# Patient Record
Sex: Female | Born: 1940 | Race: Black or African American | Hispanic: No | Smoking: Current every day smoker
Health system: Southern US, Community
[De-identification: ages and names within clinical notes are randomized; demographics above are authoritative.]

## PROBLEM LIST (undated history)

## (undated) DIAGNOSIS — M1990 Unspecified osteoarthritis, unspecified site: Secondary | ICD-10-CM

## (undated) DIAGNOSIS — R0601 Orthopnea: Secondary | ICD-10-CM

## (undated) DIAGNOSIS — I739 Peripheral vascular disease, unspecified: Secondary | ICD-10-CM

## (undated) DIAGNOSIS — I219 Acute myocardial infarction, unspecified: Secondary | ICD-10-CM

## (undated) DIAGNOSIS — I10 Essential (primary) hypertension: Secondary | ICD-10-CM

## (undated) DIAGNOSIS — K219 Gastro-esophageal reflux disease without esophagitis: Secondary | ICD-10-CM

## (undated) DIAGNOSIS — I2510 Atherosclerotic heart disease of native coronary artery without angina pectoris: Secondary | ICD-10-CM

## (undated) DIAGNOSIS — T7840XA Allergy, unspecified, initial encounter: Secondary | ICD-10-CM

## (undated) DIAGNOSIS — Z87898 Personal history of other specified conditions: Secondary | ICD-10-CM

## (undated) DIAGNOSIS — J45909 Unspecified asthma, uncomplicated: Secondary | ICD-10-CM

## (undated) DIAGNOSIS — G4730 Sleep apnea, unspecified: Secondary | ICD-10-CM

## (undated) HISTORY — PX: SHOULDER ARTHROSCOPY: SHX128

## (undated) HISTORY — PX: CORONARY ANGIOPLASTY: SHX604

## (undated) HISTORY — PX: ABDOMINAL HYSTERECTOMY: SHX81

## (undated) HISTORY — PX: VASCULAR SURGERY: SHX849

---

## 1990-01-21 HISTORY — PX: BREAST BIOPSY: SHX20

## 2001-12-26 DIAGNOSIS — I10 Essential (primary) hypertension: Secondary | ICD-10-CM | POA: Diagnosis present

## 2003-05-27 DIAGNOSIS — I739 Peripheral vascular disease, unspecified: Secondary | ICD-10-CM | POA: Diagnosis present

## 2003-05-27 DIAGNOSIS — E7800 Pure hypercholesterolemia, unspecified: Secondary | ICD-10-CM | POA: Diagnosis present

## 2003-07-26 DIAGNOSIS — I2510 Atherosclerotic heart disease of native coronary artery without angina pectoris: Secondary | ICD-10-CM | POA: Diagnosis present

## 2006-08-08 ENCOUNTER — Emergency Department: Payer: Self-pay | Admitting: Emergency Medicine

## 2006-08-08 IMAGING — CR DG CHEST 2V
1 series · 2 of 2 positions shown · non-contrast
Comparison: none

REASON FOR EXAM: cough
COMMENTS:

PROCEDURE:     DXR - DXR CHEST PA (OR AP) AND LATERAL  - [DATE]  [DATE]
RESULT:     Comparison: No available comparison exam.

[Series 1: view not recorded · 0.17mm/px · 2 of 2 slices shown]
[im 1/2]
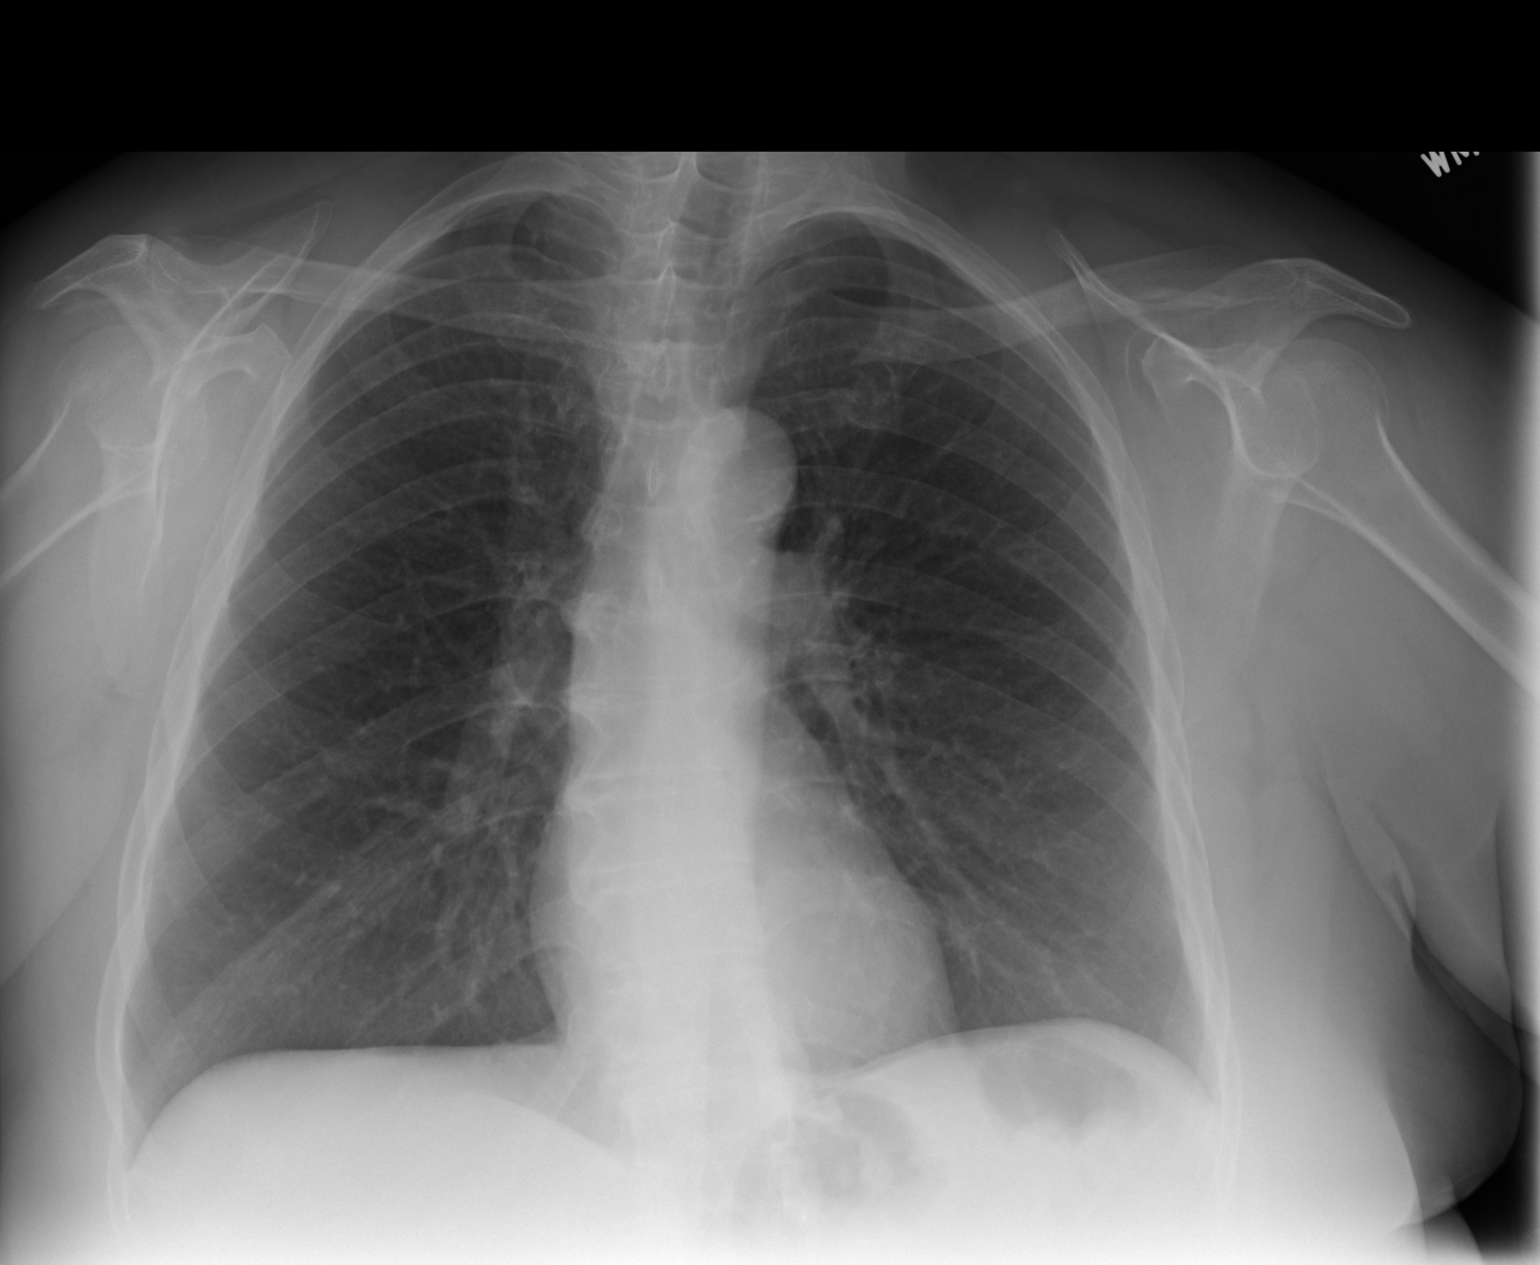
[im 2/2]
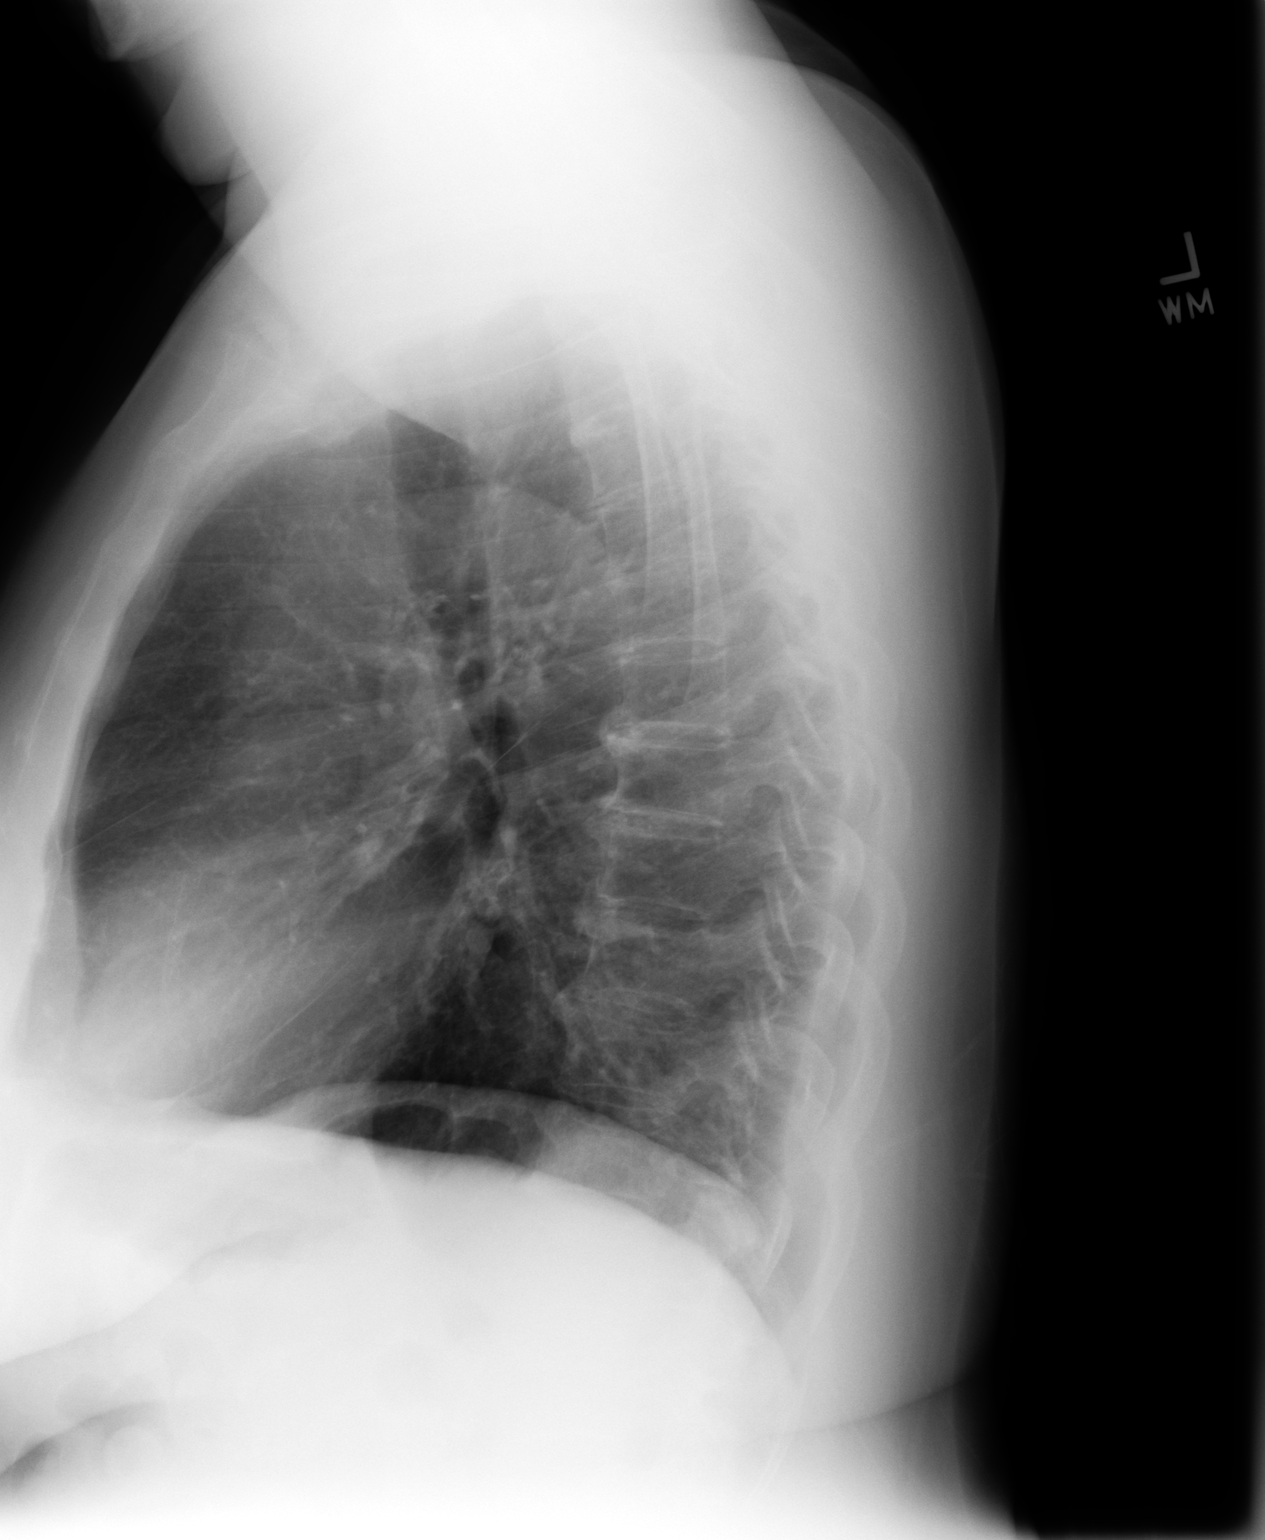

[2 of 2 positions shown; findings below may reference images not displayed]

FINDINGS: There is no significant pulmonary consolidation, pulmonary edema, pleural
effusion, nor pneumothorax. The cardiomediastinal silhouette is within
normal limits.

No displaced rib fracture is noted. There is mild dextro convex curvature of
the thoracic spine.
IMPRESSION: 1. No acute cardiopulmonary abnormality is noted.

## 2007-07-27 ENCOUNTER — Emergency Department: Payer: Self-pay | Admitting: Emergency Medicine

## 2007-10-08 ENCOUNTER — Emergency Department: Payer: Self-pay | Admitting: Internal Medicine

## 2009-05-12 ENCOUNTER — Emergency Department: Payer: Self-pay | Admitting: Emergency Medicine

## 2011-03-14 ENCOUNTER — Emergency Department: Payer: Self-pay | Admitting: Internal Medicine

## 2012-05-21 ENCOUNTER — Emergency Department: Payer: Self-pay | Admitting: Emergency Medicine

## 2012-05-21 LAB — BASIC METABOLIC PANEL
Anion Gap: 10 (ref 7–16)
BUN: 25 mg/dL — ABNORMAL HIGH (ref 7–18)
Calcium, Total: 9.3 mg/dL (ref 8.5–10.1)
Co2: 25 mmol/L (ref 21–32)
Creatinine: 1.25 mg/dL (ref 0.60–1.30)
EGFR (Non-African Amer.): 43 — ABNORMAL LOW
Glucose: 104 mg/dL — ABNORMAL HIGH (ref 65–99)
Osmolality: 282 (ref 275–301)
Potassium: 2.9 mmol/L — ABNORMAL LOW (ref 3.5–5.1)
Sodium: 139 mmol/L (ref 136–145)

## 2012-05-21 LAB — CBC
HCT: 37.3 % (ref 35.0–47.0)
HGB: 12.3 g/dL (ref 12.0–16.0)
MCHC: 33.1 g/dL (ref 32.0–36.0)
MCV: 81 fL (ref 80–100)
RBC: 4.63 10*6/uL (ref 3.80–5.20)
RDW: 14.7 % — ABNORMAL HIGH (ref 11.5–14.5)

## 2012-05-21 LAB — URIC ACID: Uric Acid: 6.6 mg/dL — ABNORMAL HIGH (ref 2.6–6.0)

## 2012-05-21 IMAGING — CR RIGHT HAND - COMPLETE 3+ VIEW
1 series · 3 of 3 positions shown · non-contrast
Comparison: none

REASON FOR EXAM: atraumatic pain
COMMENTS:

[Series 1: pa · 0.17mm/px · 3 of 3 slices shown]
[im 1/3]
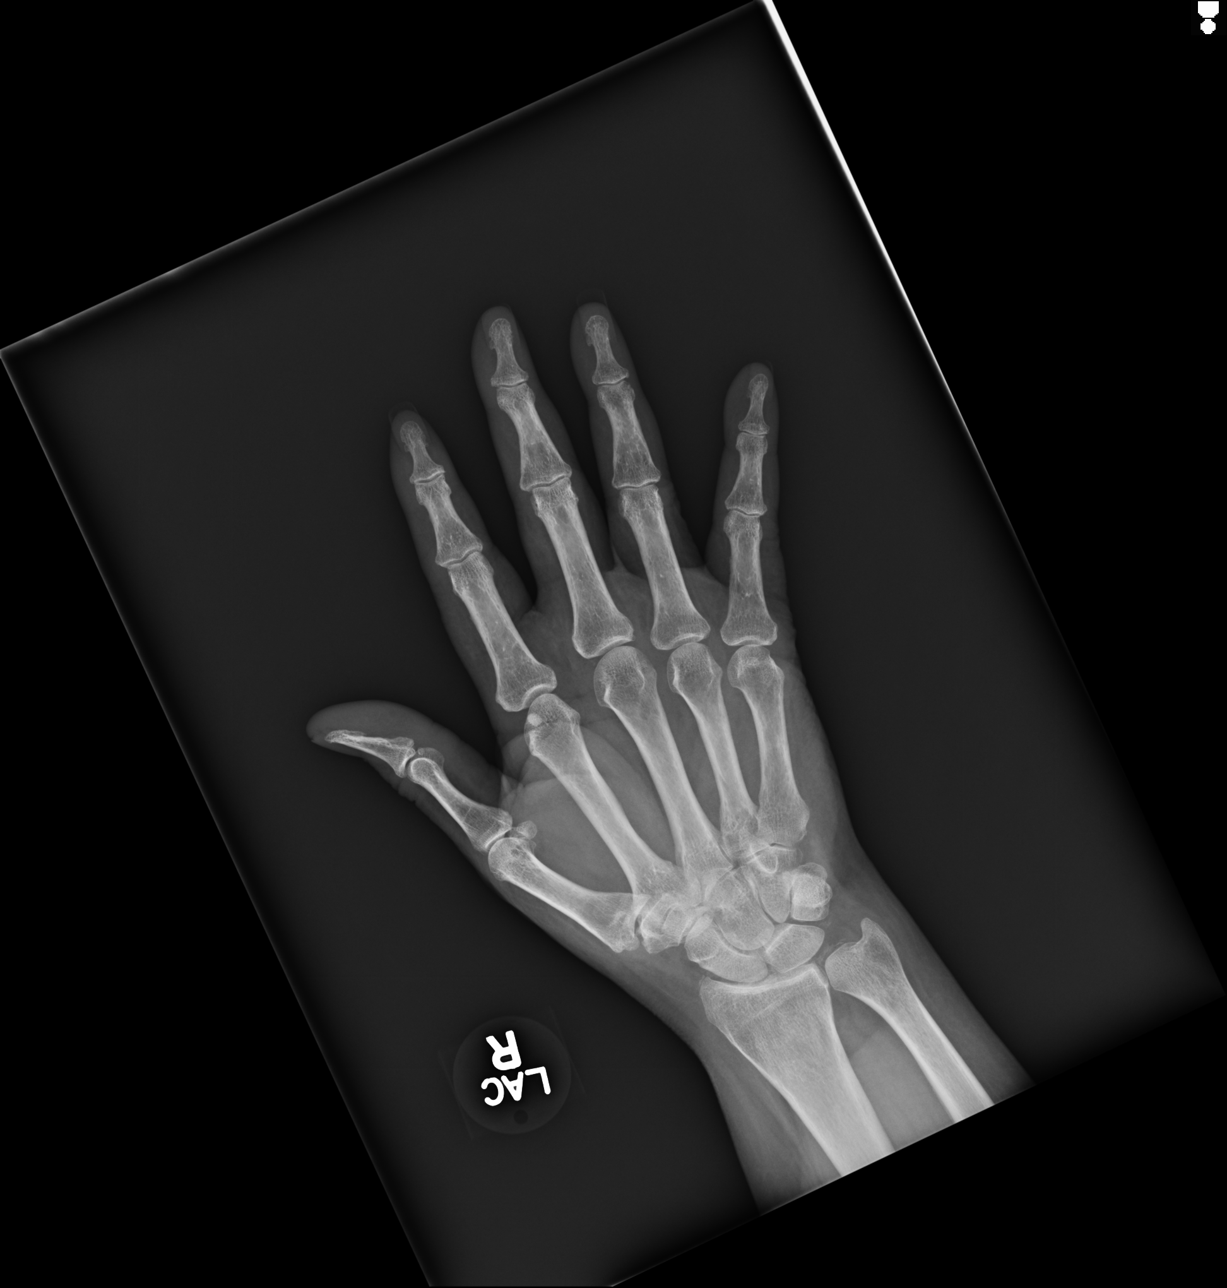
[im 2/3]
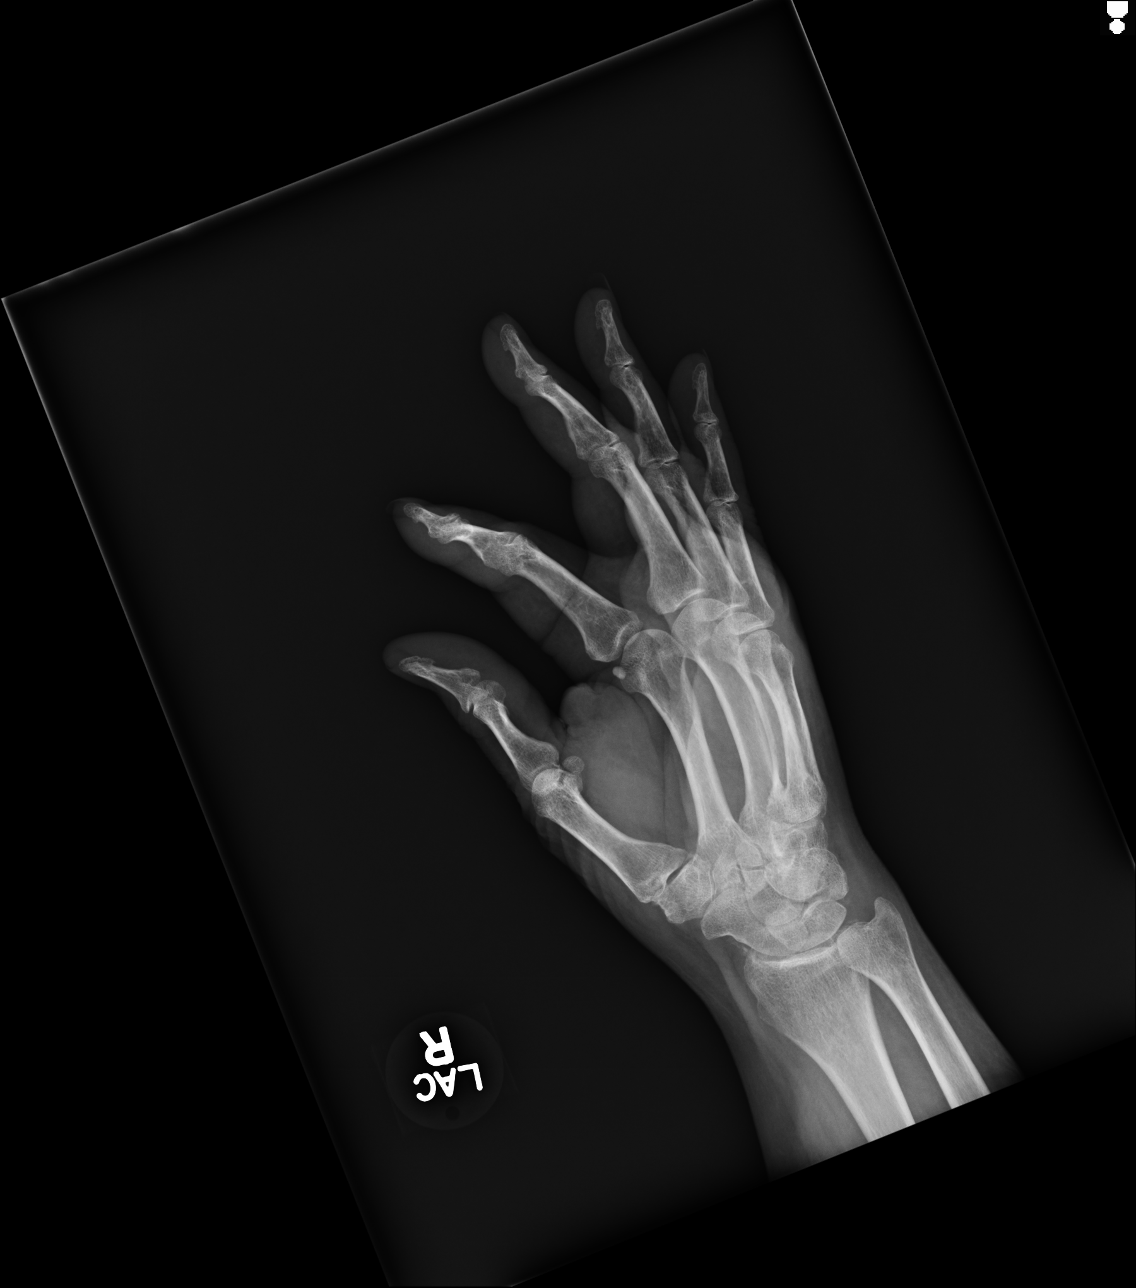
[im 3/3]
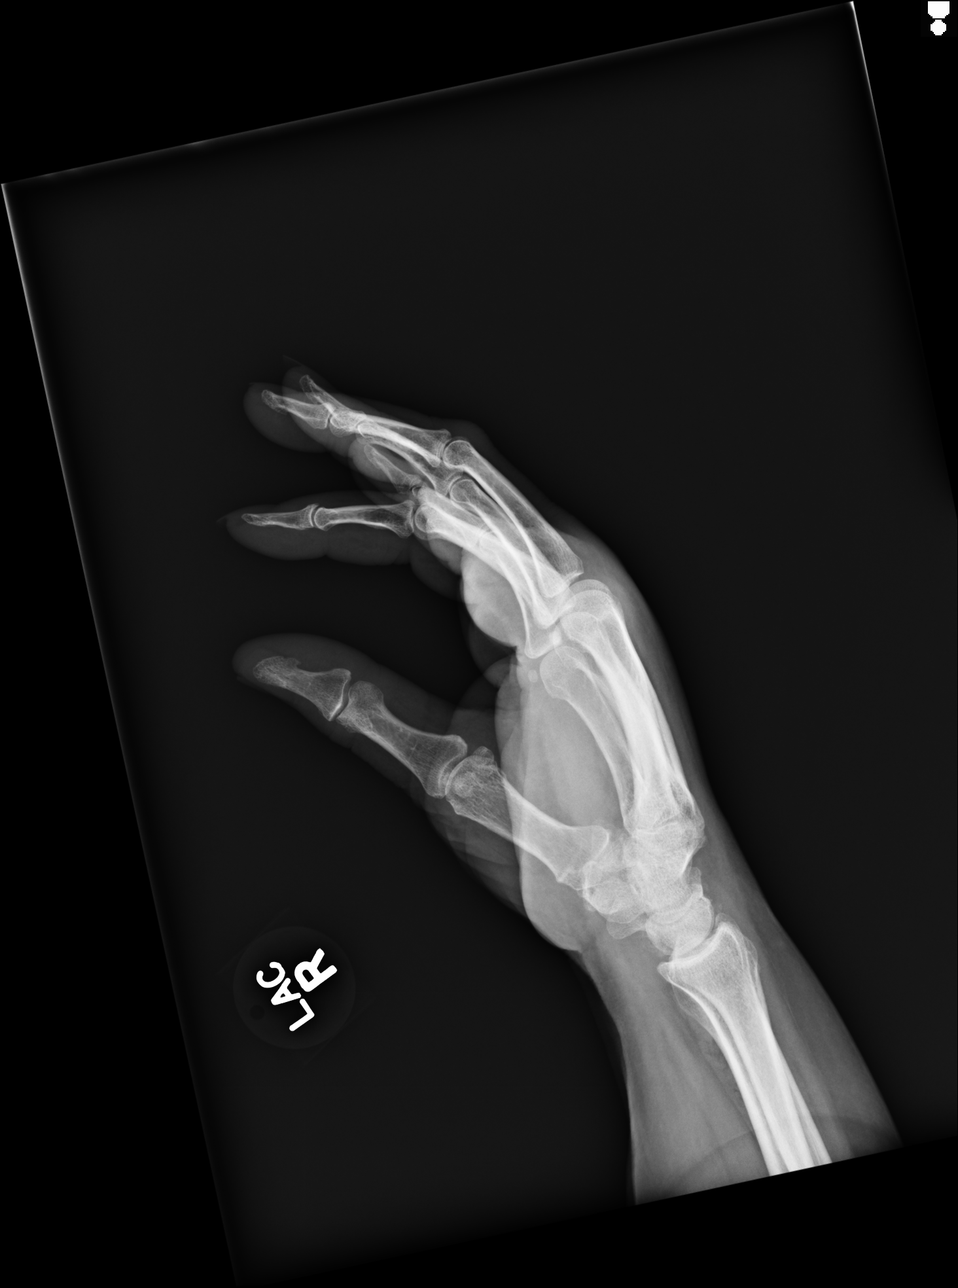

[3 of 3 positions shown; findings below may reference images not displayed]

PROCEDURE:     DXR - DXR HAND RT COMPLETE W/OBLIQUES  - [DATE]  [DATE]

RESULT:     Three views of the right hand are submitted. The site of the
patient's symptoms is not noted in the clinical history. The bones are
adequately mineralized for age. There is narrowing of the interphalangeal
joints consistent with osteoarthritis. The metacarpophalangeal joints appear
normal for age. There is mild narrowing of the carpometacarpal joints. Mild
soft tissue swelling is noted in the metacarpophalangeal joint region
diffusely.
IMPRESSION: There is no acute bony abnormality of the right hand. Mild
degenerative changes are present as described.

[REDACTED]

## 2014-09-09 ENCOUNTER — Encounter: Payer: Self-pay | Admitting: Emergency Medicine

## 2014-09-09 ENCOUNTER — Observation Stay
Admission: EM | Admit: 2014-09-09 | Discharge: 2014-09-10 | Disposition: A | Discharge status: Home / Self Care | Payer: Commercial Managed Care - HMO | Attending: Internal Medicine | Admitting: Internal Medicine

## 2014-09-09 ENCOUNTER — Observation Stay: Payer: Commercial Managed Care - HMO

## 2014-09-09 DIAGNOSIS — Z7982 Long term (current) use of aspirin: Secondary | ICD-10-CM | POA: Diagnosis not present

## 2014-09-09 DIAGNOSIS — J45909 Unspecified asthma, uncomplicated: Secondary | ICD-10-CM | POA: Diagnosis not present

## 2014-09-09 DIAGNOSIS — I10 Essential (primary) hypertension: Secondary | ICD-10-CM | POA: Diagnosis not present

## 2014-09-09 DIAGNOSIS — E860 Dehydration: Secondary | ICD-10-CM

## 2014-09-09 DIAGNOSIS — Z91041 Radiographic dye allergy status: Secondary | ICD-10-CM | POA: Diagnosis not present

## 2014-09-09 DIAGNOSIS — R112 Nausea with vomiting, unspecified: Secondary | ICD-10-CM | POA: Diagnosis not present

## 2014-09-09 DIAGNOSIS — Z885 Allergy status to narcotic agent status: Secondary | ICD-10-CM | POA: Insufficient documentation

## 2014-09-09 DIAGNOSIS — I2510 Atherosclerotic heart disease of native coronary artery without angina pectoris: Secondary | ICD-10-CM | POA: Insufficient documentation

## 2014-09-09 DIAGNOSIS — Z955 Presence of coronary angioplasty implant and graft: Secondary | ICD-10-CM | POA: Diagnosis not present

## 2014-09-09 DIAGNOSIS — F17210 Nicotine dependence, cigarettes, uncomplicated: Secondary | ICD-10-CM | POA: Insufficient documentation

## 2014-09-09 DIAGNOSIS — M6281 Muscle weakness (generalized): Secondary | ICD-10-CM | POA: Diagnosis not present

## 2014-09-09 DIAGNOSIS — R29898 Other symptoms and signs involving the musculoskeletal system: Secondary | ICD-10-CM | POA: Diagnosis present

## 2014-09-09 DIAGNOSIS — I252 Old myocardial infarction: Secondary | ICD-10-CM | POA: Insufficient documentation

## 2014-09-09 DIAGNOSIS — Z8249 Family history of ischemic heart disease and other diseases of the circulatory system: Secondary | ICD-10-CM | POA: Insufficient documentation

## 2014-09-09 DIAGNOSIS — J449 Chronic obstructive pulmonary disease, unspecified: Secondary | ICD-10-CM | POA: Diagnosis not present

## 2014-09-09 DIAGNOSIS — E876 Hypokalemia: Secondary | ICD-10-CM | POA: Diagnosis not present

## 2014-09-09 DIAGNOSIS — R944 Abnormal results of kidney function studies: Secondary | ICD-10-CM | POA: Insufficient documentation

## 2014-09-09 DIAGNOSIS — Z8042 Family history of malignant neoplasm of prostate: Secondary | ICD-10-CM | POA: Insufficient documentation

## 2014-09-09 DIAGNOSIS — R197 Diarrhea, unspecified: Secondary | ICD-10-CM | POA: Diagnosis not present

## 2014-09-09 DIAGNOSIS — Z90710 Acquired absence of both cervix and uterus: Secondary | ICD-10-CM | POA: Insufficient documentation

## 2014-09-09 DIAGNOSIS — N179 Acute kidney failure, unspecified: Secondary | ICD-10-CM

## 2014-09-09 DIAGNOSIS — D72829 Elevated white blood cell count, unspecified: Secondary | ICD-10-CM | POA: Insufficient documentation

## 2014-09-09 HISTORY — DX: Unspecified asthma, uncomplicated: J45.909

## 2014-09-09 HISTORY — DX: Allergy, unspecified, initial encounter: T78.40XA

## 2014-09-09 HISTORY — DX: Atherosclerotic heart disease of native coronary artery without angina pectoris: I25.10

## 2014-09-09 HISTORY — DX: Essential (primary) hypertension: I10

## 2014-09-09 HISTORY — DX: Acute myocardial infarction, unspecified: I21.9

## 2014-09-09 LAB — URINALYSIS COMPLETE WITH MICROSCOPIC (ARMC ONLY)
BILIRUBIN URINE: NEGATIVE
GLUCOSE, UA: NEGATIVE mg/dL
Hgb urine dipstick: NEGATIVE
KETONES UR: NEGATIVE mg/dL
LEUKOCYTES UA: NEGATIVE
NITRITE: NEGATIVE
Protein, ur: NEGATIVE mg/dL
SPECIFIC GRAVITY, URINE: 1.014 (ref 1.005–1.030)
pH: 5 (ref 5.0–8.0)

## 2014-09-09 LAB — COMPREHENSIVE METABOLIC PANEL
ALT: 10 U/L — ABNORMAL LOW (ref 14–54)
AST: 27 U/L (ref 15–41)
Albumin: 2.7 g/dL — ABNORMAL LOW (ref 3.5–5.0)
Alkaline Phosphatase: 49 U/L (ref 38–126)
Anion gap: 12 (ref 5–15)
BILIRUBIN TOTAL: 0.4 mg/dL (ref 0.3–1.2)
BUN: 63 mg/dL — AB (ref 6–20)
CO2: 24 mmol/L (ref 22–32)
Calcium: 8 mg/dL — ABNORMAL LOW (ref 8.9–10.3)
Chloride: 97 mmol/L — ABNORMAL LOW (ref 101–111)
Creatinine, Ser: 1.92 mg/dL — ABNORMAL HIGH (ref 0.44–1.00)
GFR calc Af Amer: 28 mL/min — ABNORMAL LOW (ref >60)
GFR, EST NON AFRICAN AMERICAN: 24 mL/min — AB (ref >60)
Glucose, Bld: 135 mg/dL — ABNORMAL HIGH (ref 65–99)
POTASSIUM: 3.2 mmol/L — AB (ref 3.5–5.1)
Sodium: 133 mmol/L — ABNORMAL LOW (ref 135–145)
TOTAL PROTEIN: 5.6 g/dL — AB (ref 6.5–8.1)

## 2014-09-09 LAB — CBC
HEMATOCRIT: 39.8 % (ref 35.0–47.0)
Hemoglobin: 12.6 g/dL (ref 12.0–16.0)
MCH: 25 pg — ABNORMAL LOW (ref 26.0–34.0)
MCHC: 31.7 g/dL — ABNORMAL LOW (ref 32.0–36.0)
MCV: 78.8 fL — ABNORMAL LOW (ref 80.0–100.0)
PLATELETS: 297 10*3/uL (ref 150–440)
RBC: 5.05 MIL/uL (ref 3.80–5.20)
RDW: 15.6 % — AB (ref 11.5–14.5)
WBC: 17 10*3/uL — AB (ref 3.6–11.0)

## 2014-09-09 LAB — MAGNESIUM: Magnesium: 1.9 mg/dL (ref 1.7–2.4)

## 2014-09-09 LAB — LIPASE, BLOOD: Lipase: 16 U/L — ABNORMAL LOW (ref 22–51)

## 2014-09-09 LAB — TROPONIN I: Troponin I: 0.05 ng/mL — ABNORMAL HIGH (ref <0.031)

## 2014-09-09 LAB — TSH: TSH: 2.329 u[IU]/mL (ref 0.350–4.500)

## 2014-09-09 IMAGING — CR DG CHEST 2V
1 series · 2 of 2 positions shown · non-contrast
Comparison: [DATE]

CLINICAL DATA: Leukocytosis.  Asthma.  Coronary artery disease.

EXAM:
CHEST  2 VIEW

[Series 1: dg chest 2 view · 0.14mm/px · 2 of 2 slices shown]
[im 1/2]
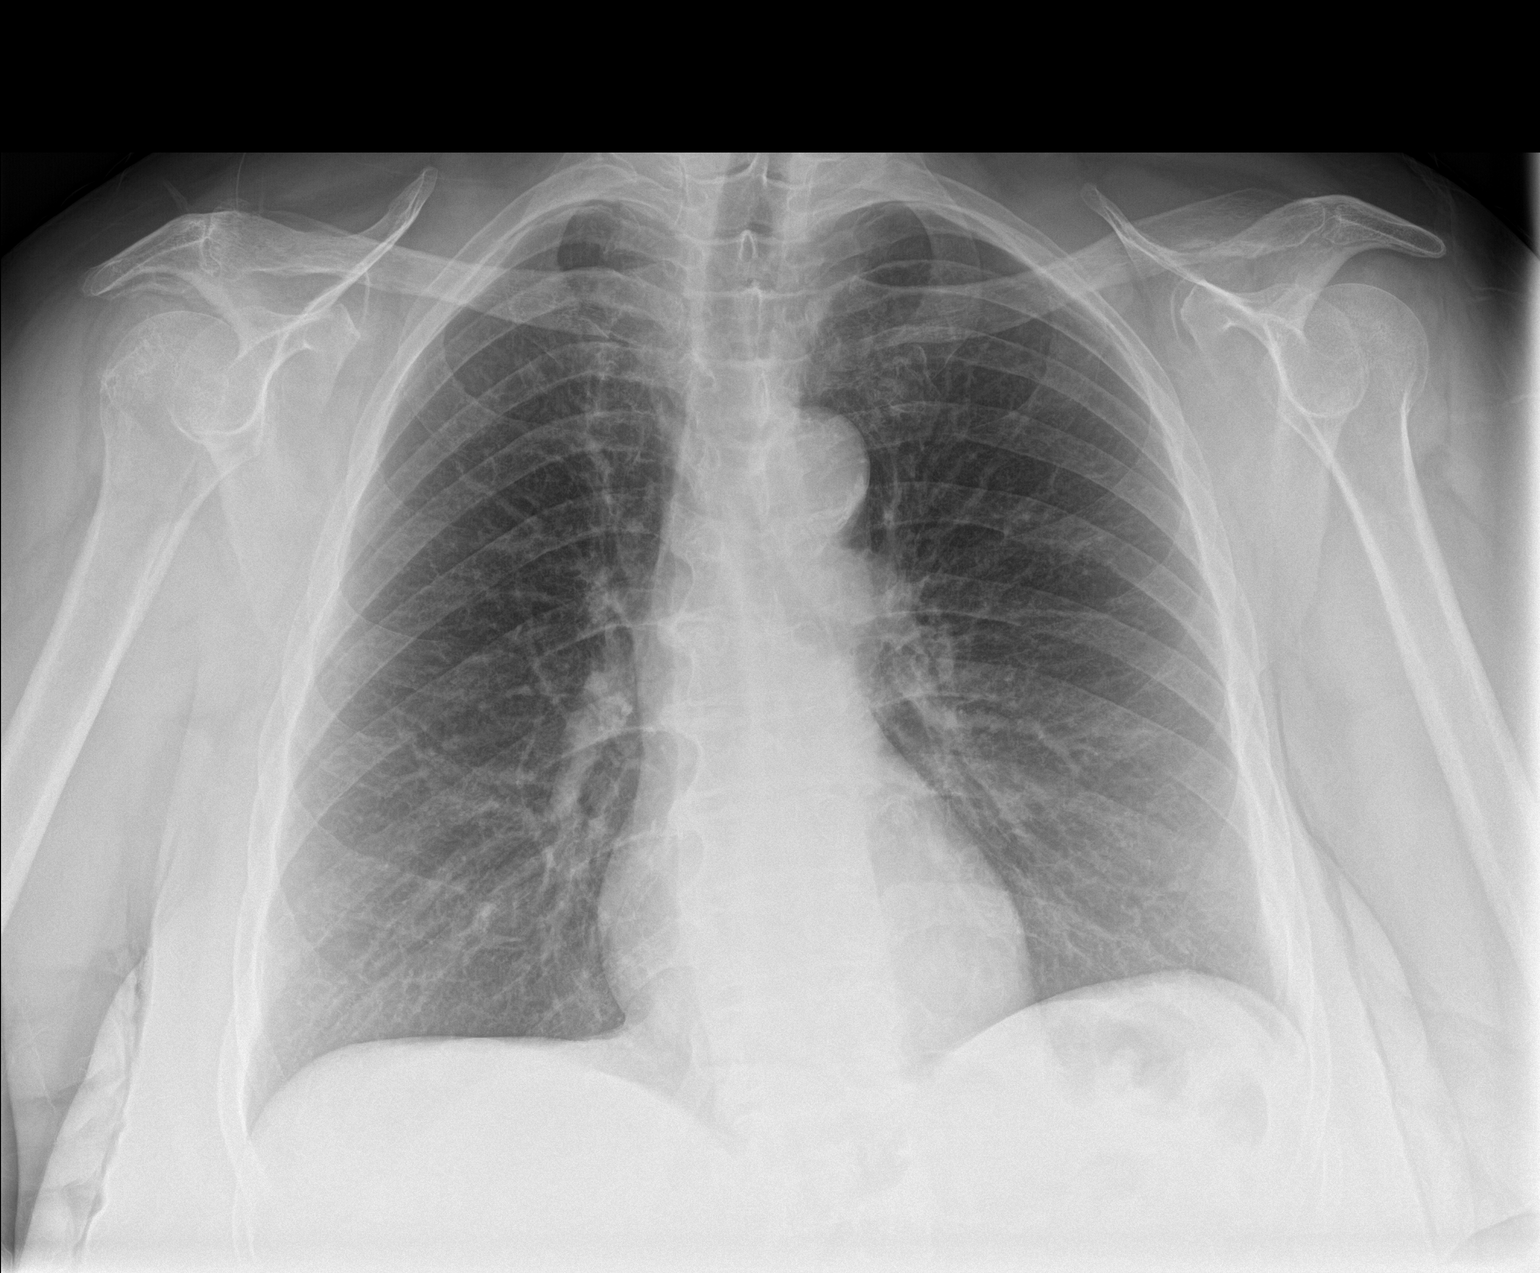
[im 2/2]
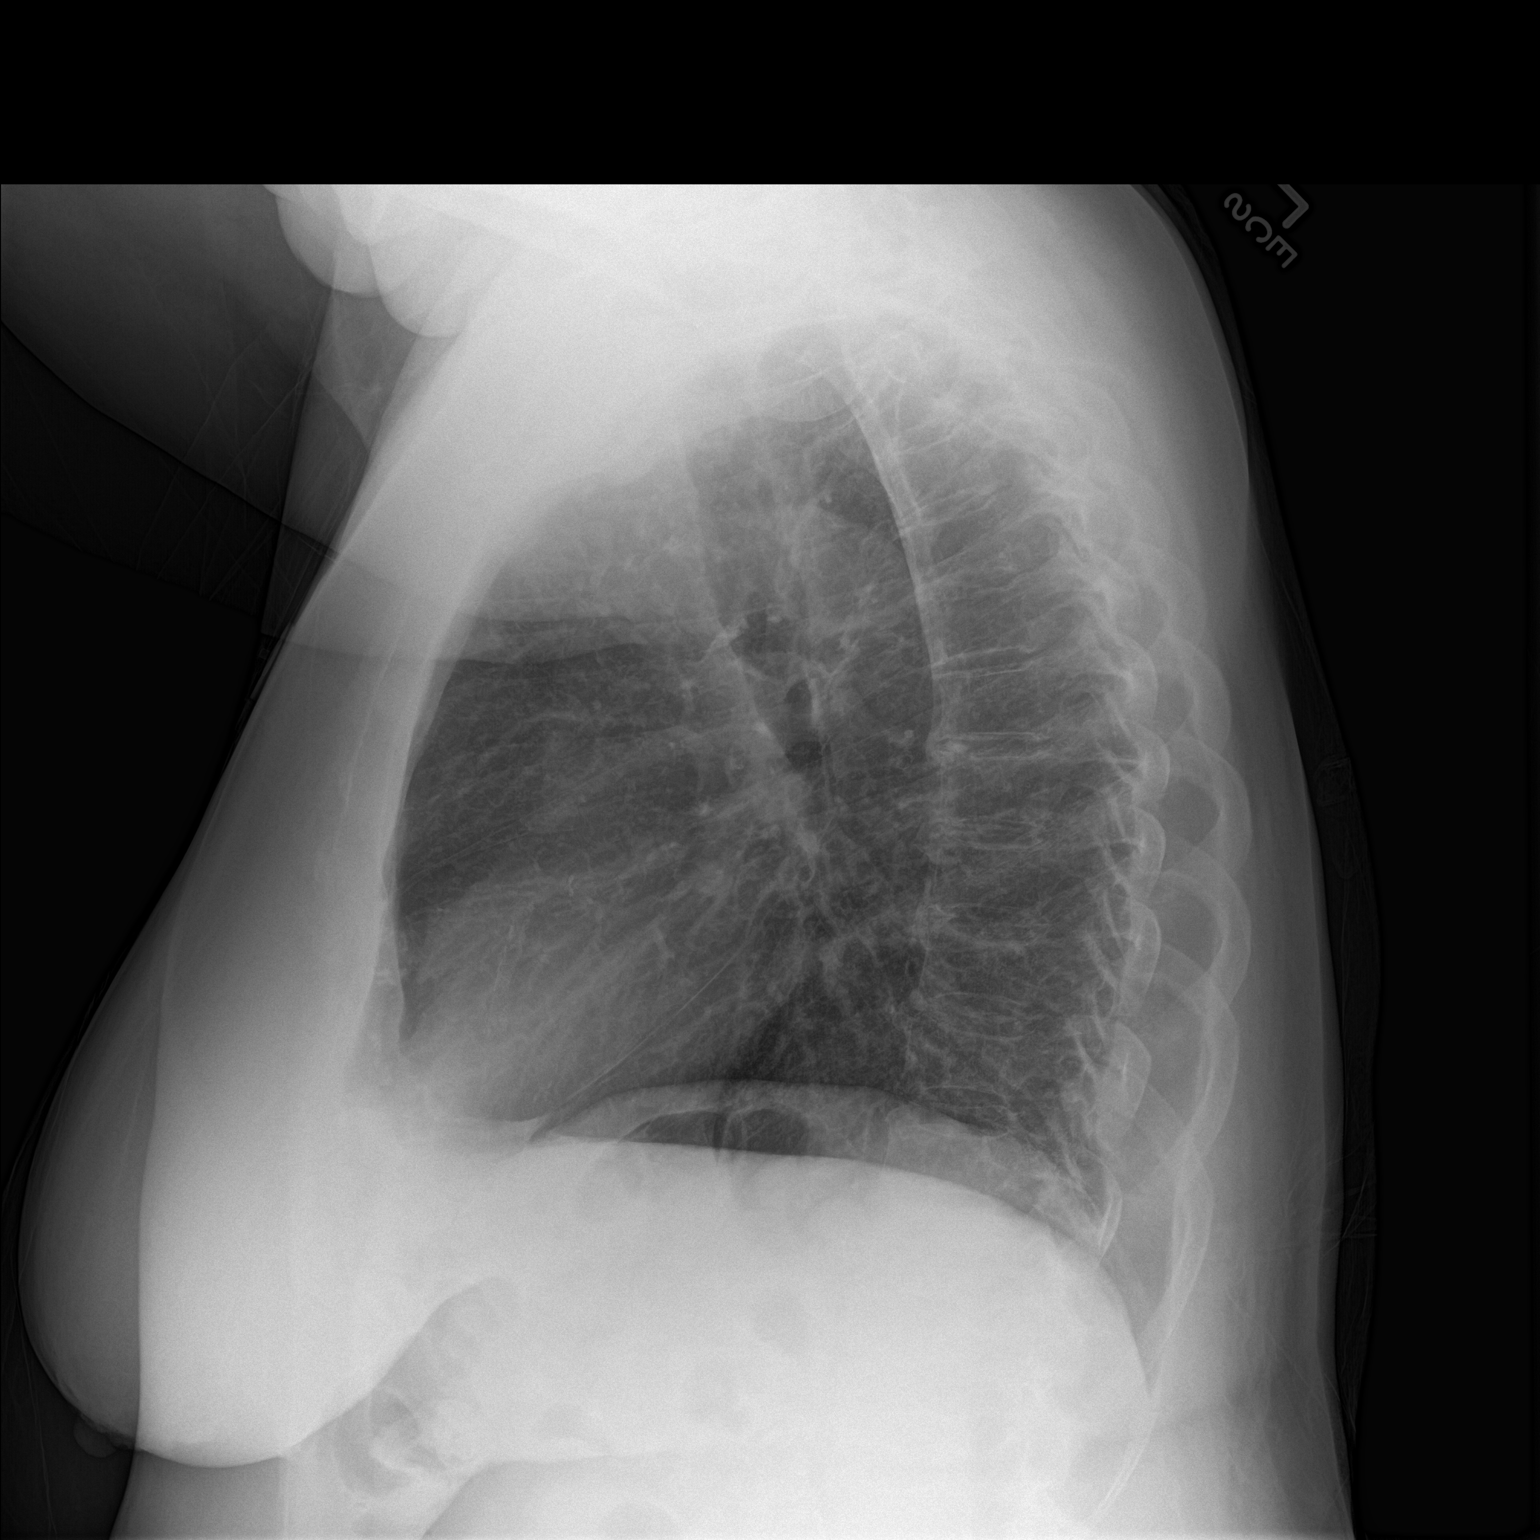

[2 of 2 positions shown; findings below may reference images not displayed]

FINDINGS: The heart size and mediastinal contours are within normal limits.
Both lungs are clear. The visualized skeletal structures are
unremarkable.
IMPRESSION: No active cardiopulmonary disease.

## 2014-09-09 MED ORDER — ASPIRIN 81 MG PO CHEW
162.0000 mg | CHEWABLE_TABLET | Freq: Once | ORAL | Status: AC
  Administered 2014-09-09: 162 mg via ORAL
  Filled 2014-09-09: qty 2

## 2014-09-09 MED ORDER — ATORVASTATIN CALCIUM 20 MG PO TABS
80.0000 mg | ORAL_TABLET | Freq: Every day | ORAL | Status: DC
  Administered 2014-09-09 – 2014-09-10 (×2): 80 mg via ORAL
  Filled 2014-09-09 (×2): qty 4

## 2014-09-09 MED ORDER — PANTOPRAZOLE SODIUM 40 MG PO TBEC
40.0000 mg | DELAYED_RELEASE_TABLET | Freq: Every day | ORAL | Status: DC
  Administered 2014-09-10: 08:00:00 40 mg via ORAL
  Filled 2014-09-09 (×2): qty 1

## 2014-09-09 MED ORDER — HEPARIN SODIUM (PORCINE) 5000 UNIT/ML IJ SOLN
5000.0000 [IU] | Freq: Three times a day (TID) | INTRAMUSCULAR | Status: DC
  Filled 2014-09-09: qty 1

## 2014-09-09 MED ORDER — ASPIRIN EC 81 MG PO TBEC
81.0000 mg | DELAYED_RELEASE_TABLET | Freq: Every day | ORAL | Status: DC
Start: 2014-09-09 — End: 2014-09-10
  Administered 2014-09-10: 08:00:00 81 mg via ORAL
  Filled 2014-09-09 (×2): qty 1

## 2014-09-09 MED ORDER — ALBUTEROL SULFATE (2.5 MG/3ML) 0.083% IN NEBU: 2.5000 mg | INHALATION_SOLUTION | RESPIRATORY_TRACT | Status: DC | PRN

## 2014-09-09 MED ORDER — SODIUM CHLORIDE 0.9 % IV SOLN
INTRAVENOUS | Status: AC
  Administered 2014-09-09 – 2014-09-10 (×2): via INTRAVENOUS

## 2014-09-09 MED ORDER — SODIUM CHLORIDE 0.9 % IV BOLUS (SEPSIS)
1000.0000 mL | Freq: Once | INTRAVENOUS | Status: AC
  Administered 2014-09-09: 1000 mL via INTRAVENOUS

## 2014-09-09 MED ORDER — POTASSIUM CHLORIDE CRYS ER 20 MEQ PO TBCR
40.0000 meq | EXTENDED_RELEASE_TABLET | Freq: Two times a day (BID) | ORAL | Status: DC
  Administered 2014-09-09 – 2014-09-10 (×3): 40 meq via ORAL
  Filled 2014-09-09 (×3): qty 2

## 2014-09-09 MED ORDER — ALBUTEROL SULFATE HFA 108 (90 BASE) MCG/ACT IN AERS: 2.0000 | INHALATION_SPRAY | RESPIRATORY_TRACT | Status: DC | PRN

## 2014-09-09 NOTE — Plan of Care (Signed)
Problem: Discharge Progression Outcomes Goal: Other Discharge Outcomes/Goals Outcome: Progressing Plan of Care Progress to Goal:  Pain:         Denies Pain. Hemodynamically Stability:         VSS.         No N/V/D noted.         Initiated IVF's.         Remains on oral K+.          Labs Improving. Complications:         No s/s of complications noted. Tolerating Diet:         Low Sodium Diet. Activity:         OOB with 1-person Assist.

## 2014-09-09 NOTE — ED Notes (Signed)
Lab notified to add troponin to blood work-says it was already processed.

## 2014-09-09 NOTE — ED Notes (Signed)
Patient transported to X-ray 

## 2014-09-09 NOTE — ED Notes (Signed)
Dr. Jacqualine Code notified of patient's elevated troponin.

## 2014-09-09 NOTE — ED Notes (Signed)
Pt presents with n/v/d for the past four days. Denies any pain.

## 2014-09-09 NOTE — ED Provider Notes (Signed)
[facility] Emergency Department Provider Note  ____________________________________________  Time seen: Approximately 10:19 AM  I have reviewed the triage vital signs and the nursing notes.   HISTORY  Chief Complaint Emesis; Diarrhea; and Nausea    HPI Ariana Lee is a 74 y.o. female history of high blood pressure, asthma. She states that 4 days ago she had an episode where she had a "stomach bug" which lasted for 2 days where she had thrown up several times, and had several loose and watery stools. These symptoms have all resolved, but since that time she's been feeling tired and lightheaded. She reports that when she is walking she just feels like her legs are very tired, and she feels weak and generally very "dry".  She reports that she does not eat breakfast on a daily basis, did not eat breakfast this morning. She did eat well last night, no further nausea vomiting or diarrhea for the last 2 days. No chest pain. No weakness in one arm or leg. No facial droop. No trouble speaking. No numbness or tingling in the arms or legs.  Denies history of frequent urinary tract infections.   Past Medical History  Diagnosis Date  . Hypertension   . Asthma     There are no active problems to display for this patient.   Past Surgical History  Procedure Laterality Date  . Abdominal hysterectomy      Current Outpatient Rx  Name  Route  Sig  Dispense  Refill  . albuterol (PROVENTIL HFA;VENTOLIN HFA) 108 (90 BASE) MCG/ACT inhaler   Inhalation   Inhale 2 puffs into the lungs every 4 (four) hours as needed for wheezing or shortness of breath.         Marland Kitchen aspirin (ASPIRIN EC) 81 MG EC tablet   Oral   Take 81 mg by mouth daily. Swallow whole.         Marland Kitchen atenolol (TENORMIN) 50 MG tablet   Oral   Take 1 tablet by mouth daily.      0   . atorvastatin (LIPITOR) 80 MG tablet   Oral   Take 1 tablet by mouth daily.      0   . ciclopirox (PENLAC) 8 %  solution   Topical   Apply 1 application topically at bedtime.       0   . felodipine (PLENDIL) 10 MG 24 hr tablet   Oral   Take 1 tablet by mouth daily.      0   . hydrochlorothiazide (HYDRODIURIL) 25 MG tablet   Oral   Take 1 tablet by mouth daily.      0   . nitroGLYCERIN (NITROSTAT) 0.3 MG SL tablet   Sublingual   Place 0.3 mg under the tongue every 5 (five) minutes as needed for chest pain.         Marland Kitchen omeprazole (PRILOSEC) 20 MG capsule   Oral   Take 1 capsule by mouth 2 (two) times daily.      0     Allergies Ivp dye and Percocet  No family history on file.  Social History Social History  Substance Use Topics  . Smoking status: Never Smoker   . Smokeless tobacco: None  . Alcohol Use: No    Review of Systems Constitutional: No fever/chills Eyes: No visual changes. ENT: No sore throat. Cardiovascular: Denies chest pain. Respiratory: Denies shortness of breath. Gastrointestinal: In the past 48 hours No abdominal pain.  No nausea, no vomiting.  No diarrhea.  No constipation. Genitourinary: Negative for dysuria. Musculoskeletal: Negative for back pain. Skin: Negative for rash. Neurological: Negative for headaches, focal weakness or numbness.  10-point ROS otherwise negative.  ____________________________________________   PHYSICAL EXAM:  VITAL SIGNS: ED Triage Vitals  Enc Vitals Group     BP 09/09/14 0920 114/51 mmHg     Pulse Rate 09/09/14 0920 77     Resp 09/09/14 0920 20     Temp 09/09/14 0920 98 F (36.7 C)     Temp Source 09/09/14 0920 Oral     SpO2 09/09/14 0920 97 %     Weight 09/09/14 0920 198 lb (89.812 kg)     Height 09/09/14 0920 '5\' 5"'$  (1.651 m)     Head Cir --      Peak Flow --      Pain Score --      Pain Loc --      Pain Edu? --      Excl. in [place]? --     Constitutional: Alert and oriented. Well appearing and in no acute distress. Eyes: Conjunctivae are normal. PERRL. EOMI. Head: Atraumatic. Nose: No  congestion/rhinnorhea. Mouth/Throat: Mucous membranes are slightly dry.  Oropharynx non-erythematous. No JVD Neck: No stridor.   Cardiovascular: Normal rate, regular rhythm. Grossly normal heart sounds.  Good peripheral circulation. Respiratory: Normal respiratory effort.  No retractions. Lungs CTAB. Gastrointestinal: Soft and nontender. No distention. No abdominal bruits. No CVA tenderness. Musculoskeletal: No lower extremity tenderness nor edema.  No joint effusions. Neurologic:  Normal speech and language. No gross focal neurologic deficits are appreciated. No gait instability. Normal cranial nerves. Normal speech. Moves all extremities 5 out of 5 with normal sensation in all extremities and the face. No evidence of neurologic deficit. No pronator drift. Skin:  Skin is warm, dry and intact. No rash noted. Psychiatric: Mood and affect are normal. Speech and behavior are normal.  ____________________________________________   LABS (all labs ordered are listed, but only abnormal results are displayed)  Labs Reviewed  LIPASE, BLOOD - Abnormal; Notable for the following:    Lipase 16 (*)    All other components within normal limits  COMPREHENSIVE METABOLIC PANEL - Abnormal; Notable for the following:    Sodium 133 (*)    Potassium 3.2 (*)    Chloride 97 (*)    Glucose, Bld 135 (*)    BUN 63 (*)    Creatinine, Ser 1.92 (*)    Calcium 8.0 (*)    Total Protein 5.6 (*)    Albumin 2.7 (*)    ALT 10 (*)    GFR calc non Af Amer 24 (*)    GFR calc Af Amer 28 (*)    All other components within normal limits  CBC - Abnormal; Notable for the following:    WBC 17.0 (*)    MCV 78.8 (*)    MCH 25.0 (*)    MCHC 31.7 (*)    RDW 15.6 (*)    All other components within normal limits  URINALYSIS COMPLETEWITH MICROSCOPIC ([facility] ONLY)  TROPONIN I   ____________________________________________  EKG  Interpreted and reviewed by me Normal sinus rhythm, nonspecific T-wave abnormality in 3 and  aVF The treatment rate 75 PR 200 QRS 96 QTc 446 Reviewed and interpreted as normal sinus rhythm with slight T-wave inversion in 3 and aVF, no acute ST elevation ____________________________________________  RADIOLOGY   ____________________________________________   PROCEDURES  Procedure(s) performed: None  Critical Care performed: No  ____________________________________________  INITIAL IMPRESSION / ASSESSMENT AND PLAN / ED COURSE  Pertinent labs & imaging results that were available during my care of the patient were reviewed by me and considered in my medical decision making (see chart for details).  Patient presents with what appears to be generalized weakness without focal abnormality after an episode of nausea and vomiting which has resolved 2 days ago. I suspect that she is likely dehydrated, but we will evaluate with basic labs and obtain a urinalysis. Her exam is very reassuring. She denies any pain. No cardipulmonary symptoms. No acute neurologic symptoms.  ----------------------------------------- 11:29 AM on 09/09/2014 -----------------------------------------  Patient not technically orthostatic, though her systolic blood pressure to drop to 903 systolic with standing. I discussed with the patient her lab results, she would like to state overnight for hydration and understands this would be under observation status.  BUN markedly elevated at 63, creatinine 1.92 which are new and acute for patient.  We will continue to work her up for urinary tract infection, and observe her with serial abdominal exams. She has no pain right now, denies any symptoms and is actually hungry and wanting to eat. Do not believe that CT imaging is currently indicated, however we will continue to follow overnight during her observation period.  Urinalysis pending at the time of admission, discussed hospitalist.  ____________________________________________   FINAL CLINICAL  IMPRESSION(S) / ED DIAGNOSES  Final diagnoses:  Dehydration  Acute kidney injury (nontraumatic)  Leukocytosis      Delman Kitten, MD 09/09/14 1130

## 2014-09-09 NOTE — H&P (Signed)
[place] [facility] [place] NAME: Ariely Riddell    MR#:  [id number]  DATE OF BIRTH:  07/11/1940  DATE OF ADMISSION:  09/09/2014  PRIMARY CARE PHYSICIAN: Dr. Elnoria Howard [facility]  REQUESTING/REFERRING PHYSICIAN: [place]:   Chief Complaint  Patient presents with  . Emesis  . Diarrhea  . Nausea    HISTORY OF PRESENT ILLNESS: Ariana Lee  is a 74 y.o. female with a known history ofhypertension, coronary artery disease, asthma had episode of diarrhea and vomiting 4 days ago and it lasted for 2 days, she could not have good oral intake while that time. She recovered from that for last 2 days but continues to feel very weak, not able to walk properly so finally her niece brought her to the emergency room. She denies any fall, injuries, numbness. She still able to move her legs while she is in the bed but is very weak and feels very dizzy while walking. Her blood pressure was on lower normal side in the ER, urinalysis was negative, potassium was found low, and she was found to be dehydrated with elevated creatinine.   PAST MEDICAL HISTORY:   Past Medical History  Diagnosis Date  . Hypertension   . Asthma   . Coronary artery disease     PAST SURGICAL HISTORY:  Past Surgical History  Procedure Laterality Date  . Abdominal hysterectomy      SOCIAL HISTORY:  Social History  Substance Use Topics  . Smoking status: Current Every Day Smoker -- 0.25 packs/day    Types: Cigarettes  . Smokeless tobacco: Not on file  . Alcohol Use: No    FAMILY HISTORY:  Family History  Problem Relation Age of Onset  . CAD Father   . Prostate cancer Brother     DRUG ALLERGIES:  Allergies  Allergen Reactions  . Ivp Dye [Iodinated Diagnostic Agents]   . Percocet [Oxycodone-Acetaminophen]     REVIEW OF SYSTEMS:   CONSTITUTIONAL: No fever, fatigue or weakness.  EYES: No blurred or double vision.  EARS, NOSE, AND THROAT: No tinnitus or ear  pain.  RESPIRATORY: No cough, shortness of breath, wheezing or hemoptysis.  CARDIOVASCULAR: No chest pain, orthopnea, edema.  GASTROINTESTINAL: No nausea, vomiting, diarrhea or abdominal pain.  GENITOURINARY: No dysuria, hematuria.  ENDOCRINE: No polyuria, nocturia,  HEMATOLOGY: No anemia, easy bruising or bleeding SKIN: No rash or lesion. MUSCULOSKELETAL: No joint pain or arthritis.   NEUROLOGIC: No tingling, numbness, positive for generalized weakness.  PSYCHIATRY: No anxiety or depression.   MEDICATIONS AT HOME:  Prior to Admission medications   Medication Sig Start Date End Date Taking? Authorizing Provider  albuterol (PROVENTIL HFA;VENTOLIN HFA) 108 (90 BASE) MCG/ACT inhaler Inhale 2 puffs into the lungs every 4 (four) hours as needed for wheezing or shortness of breath.   Yes Historical Provider, MD  aspirin (ASPIRIN EC) 81 MG EC tablet Take 81 mg by mouth daily. Swallow whole.   Yes Historical Provider, MD  atenolol (TENORMIN) 50 MG tablet Take 1 tablet by mouth daily. 08/30/14  Yes Historical Provider, MD  atorvastatin (LIPITOR) 80 MG tablet Take 1 tablet by mouth daily. 08/30/14  Yes Historical Provider, MD  ciclopirox (PENLAC) 8 % solution Apply 1 application topically at bedtime.  08/29/14  Yes Historical Provider, MD  felodipine (PLENDIL) 10 MG 24 hr tablet Take 1 tablet by mouth daily. 08/31/14  Yes Historical Provider, MD  hydrochlorothiazide (HYDRODIURIL) 25 MG tablet Take 1 tablet by  mouth daily. 08/31/14  Yes Historical Provider, MD  nitroGLYCERIN (NITROSTAT) 0.3 MG SL tablet Place 0.3 mg under the tongue every 5 (five) minutes as needed for chest pain.   Yes Historical Provider, MD  omeprazole (PRILOSEC) 20 MG capsule Take 1 capsule by mouth 2 (two) times daily. 08/30/14  Yes Historical Provider, MD      PHYSICAL EXAMINATION:   VITAL SIGNS: Blood pressure 114/51, pulse 77, temperature 98 F (36.7 C), temperature source Oral, resp. rate 20, height '5\' 5"'$  (1.651 m), weight 89.812  kg (198 lb), SpO2 97 %.  GENERAL:  74 y.o.-year-old patient lying in the bed with no acute distress.  EYES: Pupils equal, round, reactive to light and accommodation. No scleral icterus. Extraocular muscles intact.  HEENT: Head atraumatic, normocephalic. Oropharynx and nasopharynx clear.  NECK:  Supple, no jugular venous distention. No thyroid enlargement, no tenderness.  LUNGS: Normal breath sounds bilaterally, no wheezing, rales,rhonchi or crepitation. No use of accessory muscles of respiration.  CARDIOVASCULAR: S1, S2 normal. No murmurs, rubs, or gallops.  ABDOMEN: Soft, nontender, nondistended. Bowel sounds present. No organomegaly or mass.  EXTREMITIES: No pedal edema, cyanosis, or clubbing.  NEUROLOGIC: Cranial nerves II through XII are intact. Muscle strength 4/5 in all extremities. Sensation intact. Gait not checked.  PSYCHIATRIC: The patient is alert and oriented x 3.  SKIN: No obvious rash, lesion, or ulcer.   LABORATORY PANEL:   CBC  Recent Labs Lab 09/09/14 1017  WBC 17.0*  HGB 12.6  HCT 39.8  PLT 297  MCV 78.8*  MCH 25.0*  MCHC 31.7*  RDW 15.6*   ------------------------------------------------------------------------------------------------------------------  Chemistries   Recent Labs Lab 09/09/14 1017  NA 133*  K 3.2*  CL 97*  CO2 24  GLUCOSE 135*  BUN 63*  CREATININE 1.92*  CALCIUM 8.0*  AST 27  ALT 10*  ALKPHOS 49  BILITOT 0.4   ------------------------------------------------------------------------------------------------------------------ estimated creatinine clearance is 28 mL/min (by C-G formula based on Cr of 1.92). ------------------------------------------------------------------------------------------------------------------ No results for input(s): TSH, T4TOTAL, T3FREE, THYROIDAB in the last 72 hours.  Invalid input(s): FREET3   Coagulation profile No results for input(s): INR, PROTIME in the last 168  hours. ------------------------------------------------------------------------------------------------------------------- No results for input(s): DDIMER in the last 72 hours. -------------------------------------------------------------------------------------------------------------------  Cardiac Enzymes  Recent Labs Lab 09/09/14 1017  TROPONINI 0.05*   ------------------------------------------------------------------------------------------------------------------ Invalid input(s): POCBNP  ---------------------------------------------------------------------------------------------------------------  Urinalysis    Component Value Date/Time   COLORURINE YELLOW* 09/09/2014 1147   APPEARANCEUR CLEAR* 09/09/2014 1147   LABSPEC 1.014 09/09/2014 1147   PHURINE 5.0 09/09/2014 1147   GLUCOSEU NEGATIVE 09/09/2014 1147   HGBUR NEGATIVE 09/09/2014 1147   BILIRUBINUR NEGATIVE 09/09/2014 1147   KETONESUR NEGATIVE 09/09/2014 1147   PROTEINUR NEGATIVE 09/09/2014 1147   NITRITE NEGATIVE 09/09/2014 1147   LEUKOCYTESUR NEGATIVE 09/09/2014 1147     RADIOLOGY: No results found.  EKG: NSR  IMPRESSION AND PLAN:  * Dehydration , with acute worsening in renal function   will give IV fluid and recheck tomorrow.  Likely reason is her vital gastroenteritis a few days ago.  Hold her hydrochlorothiazide and hypertensive medication.  * Hypokalemia  Likely due to dehydration and hydrochlorThiazide use.  Replace orally,  check magnesium.  *  hypertension   blood pressure is low normal side, hold antihypertensive meds for now.  * History of coronary artery disease status post stent  Continue aspirin.  * History of COPD  Currently no exacerbation symptoms, continue nebulizer.  * Smoking  Counseled to quit smoking for 4 minutes.  All the records are reviewed and case discussed with ED provider. Management plans discussed with the patient, family and they are in agreement.  CODE  STATUS: FUll    TOTAL TIME TAKING CARE OF THIS PATIENT: 50 minutes.    Vaughan Basta M.D on 09/09/2014   Between 7am to 6pm - Pager - [phone]  After 6pm go to [email or web address] - password [email or web address] [facility]  [facility]  Office  [phone]  CC: Primary care physician; No primary care provider on file.

## 2014-09-10 LAB — BASIC METABOLIC PANEL
Anion gap: 7 (ref 5–15)
BUN: 50 mg/dL — AB (ref 6–20)
CO2: 26 mmol/L (ref 22–32)
Calcium: 7.7 mg/dL — ABNORMAL LOW (ref 8.9–10.3)
Chloride: 103 mmol/L (ref 101–111)
Creatinine, Ser: 1.32 mg/dL — ABNORMAL HIGH (ref 0.44–1.00)
GFR calc Af Amer: 45 mL/min — ABNORMAL LOW (ref >60)
GFR, EST NON AFRICAN AMERICAN: 38 mL/min — AB (ref >60)
GLUCOSE: 123 mg/dL — AB (ref 65–99)
POTASSIUM: 3.4 mmol/L — AB (ref 3.5–5.1)
Sodium: 136 mmol/L (ref 135–145)

## 2014-09-10 LAB — CBC
HEMATOCRIT: 35.6 % (ref 35.0–47.0)
Hemoglobin: 11.3 g/dL — ABNORMAL LOW (ref 12.0–16.0)
MCH: 25 pg — AB (ref 26.0–34.0)
MCHC: 31.7 g/dL — AB (ref 32.0–36.0)
MCV: 79.1 fL — ABNORMAL LOW (ref 80.0–100.0)
Platelets: 263 10*3/uL (ref 150–440)
RBC: 4.5 MIL/uL (ref 3.80–5.20)
RDW: 15.7 % — AB (ref 11.5–14.5)
WBC: 14 10*3/uL — ABNORMAL HIGH (ref 3.6–11.0)

## 2014-09-10 MED ORDER — POTASSIUM CHLORIDE 20 MEQ/15ML (10%) PO SOLN: 40.0000 meq | Freq: Once | ORAL | Status: DC

## 2014-09-10 NOTE — Evaluation (Deleted)
Physical Therapy Evaluation Patient Details Name: Ariana Lee MRN: [id number] DOB: Nov 23, 1940 Today's Date: 09/10/2014   History of Present Illness  Pt here with U&LE weakness   Clinical Impression  Pt has had some R LE sensory issues since having stent placed, reports that she feels about the same as her baseline in this regard. She is able to ambulate with good confidence but fatigues quickly.  PT suggested HHPT but pt is not interested and reports she will work on staying active and will be able to get back to her normal on her own.     Follow Up Recommendations Home health PT (pt not interested, PT encouraged her to keep herself active)    Equipment Recommendations       Recommendations for Other Services       Precautions / Restrictions Precautions Precautions: Fall Restrictions Weight Bearing Restrictions: No      Mobility  Bed Mobility Overal bed mobility: Modified Independent                Transfers Overall transfer level: Modified independent                  Ambulation/Gait Ambulation/Gait assistance: Min guard;Supervision Ambulation Distance (Feet): 75 Feet Assistive device: None       General Gait Details: Pt fatigues with ambualtion attempt but is safe regarding balance/safety and shows good confidence with the distance she did complete  Stairs            Wheelchair Mobility    Modified Rankin (Stroke Patients Only)       Balance                                             Pertinent Vitals/Pain Pain Assessment: No/denies pain    Home Living Family/patient expects to be discharged to:: Private residence Living Arrangements: Other relatives                    Prior Function Level of Independence: Independent         Comments: pt is normally able to do all that she needs     Hand Dominance        Extremity/Trunk Assessment   Upper Extremity Assessment: Overall WFL for tasks assessed  (minimal L UE weakness compared to R)           Lower Extremity Assessment: Generalized weakness;Overall WFL for tasks assessed         Communication      Cognition Arousal/Alertness: Awake/alert Behavior During Therapy: WFL for tasks assessed/performed Overall Cognitive Status: Within Functional Limits for tasks assessed                      General Comments      Exercises        Assessment/Plan    PT Assessment Patient needs continued PT services  PT Diagnosis Difficulty walking;Generalized weakness   PT Problem List Decreased strength;Decreased balance;Decreased activity tolerance  PT Treatment Interventions Gait training;Functional mobility training;Therapeutic activities;Balance training;Neuromuscular re-education;Therapeutic exercise   PT Goals (Current goals can be found in the Care Plan section) Acute Rehab PT Goals Patient Stated Goal: "I just want to go home." PT Goal Formulation: With patient Time For Goal Achievement: 09/17/14 Potential to Achieve Goals: Good    Frequency Min 2X/week   Barriers to discharge  Co-evaluation               End of Session Equipment Utilized During Treatment: Gait belt Activity Tolerance: Patient limited by fatigue Patient left: with bed alarm set           Time: 0051-1021 PT Time Calculation (min) (ACUTE ONLY): 27 min   Charges:   PT Evaluation $Initial PT Evaluation Tier I: 1 Procedure     PT G Codes:       Wayne Both, PT, DPT [phone]  Kreg Shropshire 09/10/2014, 12:33 PM

## 2014-09-10 NOTE — Progress Notes (Signed)
MD making rounds. Discharge orders received. IV removed. No prescriptions for patient. Per physician eat or drink foods which contain potassium. Handout provided for potassium rich foods. Verbalizes understanding. Discharge paperwork provided, explained, signed and witnessed. No unanswered questions. Escorted via wheelchair by nursing staff. All belongings sent with patient and family.

## 2014-09-10 NOTE — Plan of Care (Signed)
Problem: Discharge Progression Outcomes Goal: Other Discharge Outcomes/Goals Outcome: Progressing Plan of care progress to goal for: 1. Pain-no c/o pain this shift 2. Hemodynamically-             -VSS, pt remains afebrile this shift             -IV fluids continue per orders 3. Complications-no evidence of this shift 4. Diet-pt tolerating diet this shift 5. Activity-pt up to the BR with assistance

## 2014-09-10 NOTE — Discharge Summary (Signed)
[facility] NAME: Ariana Lee    MR#:  [id number]  DATE OF BIRTH:  Dec 10, 1940  DATE OF ADMISSION:  09/09/2014 ADMITTING PHYSICIAN: Ariana Basta, MD  DATE OF DISCHARGE:09/10/2014 PRIMARY CARE PHYSICIAN: No primary care provider on file.    ADMISSION DIAGNOSIS:  Dehydration [E86.0] Leukocytosis [D72.829] Elevated WBCs [D72.829] Acute kidney injury (nontraumatic) [N17.9]  DISCHARGE DIAGNOSIS:  Active Problems:   Weakness of both lower limbs   SECONDARY DIAGNOSIS:   Past Medical History  Diagnosis Date  . Hypertension   . Asthma   . Coronary artery disease   . Allergy   . Myocardial infarction     HOSPITAL COURSE:  74 year old female with history of hypertension, coronary disease who presented with weakness after a episode of gastroenteritis. For further details please further H&P.  1. Acute kidney injury: This is secondary to dehydration with nausea and vomiting from her gastric antritis. Patient's creatinine has improved with IV fluids.  2. Hypokalemia: This is secondary dehydration and nausea vomiting and diarrhea. Potassium was repleted. Patient is encouraged to eat banana and orange juice. Discharge potassium is 3.4.  3. History of essential hypertension: Patient's blood pressure was low normal. Patient was not started on her lip pressure medications while in the hospital. We are discontinuing to these for now. She'll continue on atenolol. She will monitor her blood pressure and write it down daily for her primary care physician. She may be restarted once her blood pressure is elevated.  4. History of CAD and stent. Patient will continue on aspirin.  5. History COPD: Patient was not in exacerbation. Patient will continue her outpatient inhalers.    DISCHARGE CONDITIONS AND DIET:  Since being discharged home in stable condition on a heart healthy diet  CONSULTS OBTAINED:    none  DRUG ALLERGIES:    Allergies  Allergen Reactions  . Ivp Dye [Iodinated Diagnostic Agents]   . Percocet [Oxycodone-Acetaminophen]     DISCHARGE MEDICATIONS:   Current Discharge Medication List    CONTINUE these medications which have NOT CHANGED   Details  albuterol (PROVENTIL HFA;VENTOLIN HFA) 108 (90 BASE) MCG/ACT inhaler Inhale 2 puffs into the lungs every 4 (four) hours as needed for wheezing or shortness of breath.    aspirin (ASPIRIN EC) 81 MG EC tablet Take 81 mg by mouth daily. Swallow whole.    atenolol (TENORMIN) 50 MG tablet Take 1 tablet by mouth daily. Refills: 0    atorvastatin (LIPITOR) 80 MG tablet Take 1 tablet by mouth daily. Refills: 0    ciclopirox (PENLAC) 8 % solution Apply 1 application topically at bedtime.  Refills: 0    nitroGLYCERIN (NITROSTAT) 0.3 MG SL tablet Place 0.3 mg under the tongue every 5 (five) minutes as needed for chest pain.    omeprazole (PRILOSEC) 20 MG capsule Take 1 capsule by mouth 2 (two) times daily. Refills: 0      STOP taking these medications     felodipine (PLENDIL) 10 MG 24 hr tablet      hydrochlorothiazide (HYDRODIURIL) 25 MG tablet               Today   CHIEF COMPLAINT:  Patient is doing well this morning. Patient is ambulating. Patient has no focal deficits.   VITAL SIGNS:  Blood pressure 105/42, pulse 70, temperature 98 F (36.7 C), temperature source Oral, resp. rate 20, height '5\' 5"'$  (1.651 m), weight 88.905 kg (196 lb), SpO2 100 %.  REVIEW OF SYSTEMS:  Review of Systems  Constitutional: Negative for fever, chills and malaise/fatigue.  HENT: Negative for sore throat.   Eyes: Negative for blurred vision.  Respiratory: Negative for cough, hemoptysis, shortness of breath and wheezing.   Cardiovascular: Negative for chest pain, palpitations and leg swelling.  Gastrointestinal: Negative for nausea, vomiting, abdominal pain, diarrhea and blood in stool.  Genitourinary: Negative for dysuria.  Musculoskeletal:  Negative for back pain.  Neurological: Negative for dizziness, tremors and headaches.  Endo/Heme/Allergies: Does not bruise/bleed easily.     PHYSICAL EXAMINATION:  GENERAL:  74 y.o.-year-old patient lying in the bed with no acute distress.  NECK:  Supple, no jugular venous distention. No thyroid enlargement, no tenderness.  LUNGS: Normal breath sounds bilaterally, no wheezing, rales,rhonchi  No use of accessory muscles of respiration.  CARDIOVASCULAR: S1, S2 normal. No murmurs, rubs, or gallops.  ABDOMEN: Soft, non-tender, non-distended. Bowel sounds present. No organomegaly or mass.  EXTREMITIES: No pedal edema, cyanosis, or clubbing.  PSYCHIATRIC: The patient is alert and oriented x 3.  SKIN: No obvious rash, lesion, or ulcer.   DATA REVIEW:   CBC  Recent Labs Lab 09/10/14 0522  WBC 14.0*  HGB 11.3*  HCT 35.6  PLT 263    Chemistries   Recent Labs Lab 09/09/14 1017 09/10/14 0522  NA 133* 136  K 3.2* 3.4*  CL 97* 103  CO2 24 26  GLUCOSE 135* 123*  BUN 63* 50*  CREATININE 1.92* 1.32*  CALCIUM 8.0* 7.7*  MG 1.9  --   AST 27  --   ALT 10*  --   ALKPHOS 49  --   BILITOT 0.4  --     Cardiac Enzymes  Recent Labs Lab 09/09/14 1017  TROPONINI 0.05*    Microbiology Results  '@MICRORSLT48'$ @  RADIOLOGY:  Dg Chest 2 View  09/09/2014   CLINICAL DATA:  Leukocytosis.  Asthma.  Coronary artery disease.  EXAM: CHEST  2 VIEW  COMPARISON:  08/08/2006  FINDINGS: The heart size and mediastinal contours are within normal limits. Both lungs are clear. The visualized skeletal structures are unremarkable.  IMPRESSION: No active cardiopulmonary disease.   Electronically Signed   By: Earle Gell M.D.   On: 09/09/2014 19:16      Management plans discussed with the patient and she is in agreement. Stable for discharge home  Patient should follow up with PCP in one week  CODE STATUS:     Code Status Orders        Start     Ordered   09/09/14 1356  Full code    Continuous     09/09/14 1355      TOTAL TIME TAKING CARE OF THIS PATIENT: 35 minutes.    Raunel Dimartino M.D on 09/10/2014 at 11:21 AM  Between 7am to 6pm - Pager - [phone] After 6pm go to [email or web address] - password [email or web address] [facility]  [facility]  Office  [phone]  CC: Primary care physician; No primary care provider on file.

## 2014-09-10 NOTE — Evaluation (Signed)
Physical Therapy Evaluation Patient Details Name: Ariana Lee MRN: [id number] DOB: 01-03-1941 Today's Date: 09/10/2014   History of Present Illness  Pt here with U&LE weakness   Clinical Impression  Pt has had some R LE sensory issues since having stent placed, reports that she feels about the same as her baseline in this regard. She is able to ambulate with good confidence but fatigues quickly. PT suggested HHPT but pt is not interested and reports she will work on staying active and will be able to get back to her normal on her own.     Follow Up Recommendations Home health PT (pt not interested, PT encouraged her to keep herself active)    Equipment Recommendations       Recommendations for Other Services       Precautions / Restrictions Precautions Precautions: Fall Restrictions Weight Bearing Restrictions: No      Mobility  Bed Mobility Overal bed mobility: Modified Independent                Transfers Overall transfer level: Modified independent                  Ambulation/Gait Ambulation/Gait assistance: Min guard;Supervision Ambulation Distance (Feet): 75 Feet Assistive device: None       General Gait Details: Pt fatigues with ambualtion attempt but is safe regarding balance/safety and shows good confidence with the distance she did complete  Stairs            Wheelchair Mobility    Modified Rankin (Stroke Patients Only)       Balance                                             Pertinent Vitals/Pain Pain Assessment: No/denies pain    Home Living Family/patient expects to be discharged to:: Private residence Living Arrangements: Other relatives                    Prior Function Level of Independence: Independent         Comments: pt is normally able to do all that she needs     Hand Dominance        Extremity/Trunk Assessment   Upper Extremity Assessment: Overall WFL for tasks assessed  (minimal L UE weakness compared to R)           Lower Extremity Assessment: Generalized weakness;Overall WFL for tasks assessed         Communication      Cognition Arousal/Alertness: Awake/alert Behavior During Therapy: WFL for tasks assessed/performed Overall Cognitive Status: Within Functional Limits for tasks assessed                      General Comments      Exercises        Assessment/Plan    PT Assessment Patient needs continued PT services  PT Diagnosis Difficulty walking;Generalized weakness   PT Problem List Decreased strength;Decreased balance;Decreased activity tolerance  PT Treatment Interventions Gait training;Functional mobility training;Therapeutic activities;Balance training;Neuromuscular re-education;Therapeutic exercise   PT Goals (Current goals can be found in the Care Plan section) Acute Rehab PT Goals Patient Stated Goal: "I just want to go home." PT Goal Formulation: With patient Time For Goal Achievement: 09/17/14 Potential to Achieve Goals: Good    Frequency Min 2X/week   Barriers to discharge  Co-evaluation               End of Session Equipment Utilized During Treatment: Gait belt Activity Tolerance: Patient limited by fatigue Patient left: with bed alarm set      Functional Assessment Tool Used: clinical judgement Functional Limitation: Mobility: Walking and moving around Mobility: Walking and Moving Around Current Status (C7670): At least 20 percent but less than 40 percent impaired, limited or restricted Mobility: Walking and Moving Around Goal Status [phone]): At least 20 percent but less than 40 percent impaired, limited or restricted    Time: 4961-1643 PT Time Calculation (min) (ACUTE ONLY): 27 min   Charges:   PT Evaluation $Initial PT Evaluation Tier I: 1 Procedure     PT G Codes:   PT G-Codes **NOT FOR INPATIENT CLASS** Functional Assessment Tool Used: clinical judgement Functional  Limitation: Mobility: Walking and moving around Mobility: Walking and Moving Around Current Status (D3912): At least 20 percent but less than 40 percent impaired, limited or restricted Mobility: Walking and Moving Around Goal Status [phone]): At least 20 percent but less than 40 percent impaired, limited or restricted   Wayne Both, PT, DPT [phone]  Kreg Shropshire 09/10/2014, 2:54 PM

## 2015-04-27 ENCOUNTER — Other Ambulatory Visit: Payer: Self-pay | Admitting: Internal Medicine

## 2015-04-27 DIAGNOSIS — Z1231 Encounter for screening mammogram for malignant neoplasm of breast: Secondary | ICD-10-CM

## 2015-05-08 ENCOUNTER — Ambulatory Visit
Admission: RE | Admit: 2015-05-08 | Discharge: 2015-05-08 | Disposition: A | Discharge status: Home / Self Care | Payer: Commercial Managed Care - HMO | Source: Ambulatory Visit | Attending: Internal Medicine | Admitting: Internal Medicine

## 2015-05-08 ENCOUNTER — Other Ambulatory Visit: Payer: Self-pay | Admitting: Internal Medicine

## 2015-05-08 DIAGNOSIS — Z1231 Encounter for screening mammogram for malignant neoplasm of breast: Secondary | ICD-10-CM | POA: Diagnosis not present

## 2016-11-11 ENCOUNTER — Encounter: Payer: Self-pay | Admitting: *Deleted

## 2016-11-12 ENCOUNTER — Encounter: Payer: Self-pay | Admitting: Ophthalmology

## 2016-11-12 ENCOUNTER — Ambulatory Visit
Admission: RE | Admit: 2016-11-12 | Discharge: 2016-11-12 | Disposition: A | Discharge status: Home / Self Care | Payer: Medicare HMO | Source: Ambulatory Visit | Attending: Ophthalmology | Admitting: Ophthalmology

## 2016-11-12 ENCOUNTER — Ambulatory Visit: Payer: Medicare HMO | Admitting: Anesthesiology

## 2016-11-12 ENCOUNTER — Encounter
Admission: RE | Disposition: A | Discharge status: Home / Self Care | Payer: Self-pay | Source: Ambulatory Visit | Attending: Ophthalmology

## 2016-11-12 DIAGNOSIS — K219 Gastro-esophageal reflux disease without esophagitis: Secondary | ICD-10-CM | POA: Diagnosis not present

## 2016-11-12 DIAGNOSIS — I739 Peripheral vascular disease, unspecified: Secondary | ICD-10-CM | POA: Insufficient documentation

## 2016-11-12 DIAGNOSIS — Z79899 Other long term (current) drug therapy: Secondary | ICD-10-CM | POA: Insufficient documentation

## 2016-11-12 DIAGNOSIS — J449 Chronic obstructive pulmonary disease, unspecified: Secondary | ICD-10-CM | POA: Insufficient documentation

## 2016-11-12 DIAGNOSIS — I252 Old myocardial infarction: Secondary | ICD-10-CM | POA: Insufficient documentation

## 2016-11-12 DIAGNOSIS — I2510 Atherosclerotic heart disease of native coronary artery without angina pectoris: Secondary | ICD-10-CM | POA: Insufficient documentation

## 2016-11-12 DIAGNOSIS — G4730 Sleep apnea, unspecified: Secondary | ICD-10-CM | POA: Insufficient documentation

## 2016-11-12 DIAGNOSIS — Z955 Presence of coronary angioplasty implant and graft: Secondary | ICD-10-CM | POA: Diagnosis not present

## 2016-11-12 DIAGNOSIS — F17200 Nicotine dependence, unspecified, uncomplicated: Secondary | ICD-10-CM | POA: Diagnosis not present

## 2016-11-12 DIAGNOSIS — Z6834 Body mass index (BMI) 34.0-34.9, adult: Secondary | ICD-10-CM | POA: Diagnosis not present

## 2016-11-12 DIAGNOSIS — I10 Essential (primary) hypertension: Secondary | ICD-10-CM | POA: Diagnosis not present

## 2016-11-12 DIAGNOSIS — H2511 Age-related nuclear cataract, right eye: Secondary | ICD-10-CM | POA: Insufficient documentation

## 2016-11-12 HISTORY — DX: Orthopnea: R06.01

## 2016-11-12 HISTORY — DX: Unspecified osteoarthritis, unspecified site: M19.90

## 2016-11-12 HISTORY — DX: Sleep apnea, unspecified: G47.30

## 2016-11-12 HISTORY — DX: Peripheral vascular disease, unspecified: I73.9

## 2016-11-12 HISTORY — PX: CATARACT EXTRACTION W/PHACO: SHX586

## 2016-11-12 HISTORY — DX: Gastro-esophageal reflux disease without esophagitis: K21.9

## 2016-11-12 SURGERY — PHACOEMULSIFICATION, CATARACT, WITH IOL INSERTION
Anesthesia: Monitor Anesthesia Care | Site: Eye | Laterality: Right | Wound class: Clean

## 2016-11-12 MED ORDER — CARBACHOL 0.01 % IO SOLN
INTRAOCULAR | Status: DC | PRN
  Administered 2016-11-12: .5 mL via INTRAOCULAR

## 2016-11-12 MED ORDER — NA CHONDROIT SULF-NA HYALURON 40-17 MG/ML IO SOLN
INTRAOCULAR | Status: DC | PRN
  Administered 2016-11-12: 1 mL via INTRAOCULAR

## 2016-11-12 MED ORDER — MIDAZOLAM HCL 2 MG/2ML IJ SOLN
INTRAMUSCULAR | Status: DC | PRN
  Administered 2016-11-12 (×2): 1 mg via INTRAVENOUS

## 2016-11-12 MED ORDER — MOXIFLOXACIN HCL 0.5 % OP SOLN: 1.0000 [drp] | OPHTHALMIC | Status: DC | PRN

## 2016-11-12 MED ORDER — TETRACAINE HCL 0.5 % OP SOLN
OPHTHALMIC | Status: AC
  Filled 2016-11-12: qty 4

## 2016-11-12 MED ORDER — LIDOCAINE HCL (PF) 4 % IJ SOLN
INTRAMUSCULAR | Status: AC
  Filled 2016-11-12: qty 5

## 2016-11-12 MED ORDER — MOXIFLOXACIN HCL 0.5 % OP SOLN
OPHTHALMIC | Status: AC
  Filled 2016-11-12: qty 3

## 2016-11-12 MED ORDER — SODIUM CHLORIDE 0.9 % IV SOLN
INTRAVENOUS | Status: DC
  Administered 2016-11-12 (×2): via INTRAVENOUS

## 2016-11-12 MED ORDER — EPINEPHRINE PF 1 MG/ML IJ SOLN
INTRAMUSCULAR | Status: AC
  Filled 2016-11-12: qty 1

## 2016-11-12 MED ORDER — NA CHONDROIT SULF-NA HYALURON 40-17 MG/ML IO SOLN
INTRAOCULAR | Status: AC
  Filled 2016-11-12: qty 1

## 2016-11-12 MED ORDER — TETRACAINE HCL 0.5 % OP SOLN
OPHTHALMIC | Status: DC | PRN
  Administered 2016-11-12: 2 [drp] via OPHTHALMIC

## 2016-11-12 MED ORDER — POVIDONE-IODINE 5 % OP SOLN
OPHTHALMIC | Status: DC | PRN
  Administered 2016-11-12: 1 via OPHTHALMIC

## 2016-11-12 MED ORDER — EPINEPHRINE PF 1 MG/ML IJ SOLN
INTRAOCULAR | Status: DC | PRN
  Administered 2016-11-12: 1 mL via OPHTHALMIC

## 2016-11-12 MED ORDER — ARMC OPHTHALMIC DILATING DROPS
1.0000 "application " | OPHTHALMIC | Status: AC
  Administered 2016-11-12 (×3): 1 via OPHTHALMIC

## 2016-11-12 MED ORDER — POVIDONE-IODINE 5 % OP SOLN
OPHTHALMIC | Status: AC
  Filled 2016-11-12: qty 30

## 2016-11-12 MED ORDER — BSS IO SOLN
INTRAOCULAR | Status: DC | PRN
  Administered 2016-11-12: 2 mL via OPHTHALMIC

## 2016-11-12 MED ORDER — MOXIFLOXACIN HCL 0.5 % OP SOLN
OPHTHALMIC | Status: DC | PRN
  Administered 2016-11-12: .2 mL via OPHTHALMIC

## 2016-11-12 MED ORDER — ARMC OPHTHALMIC DILATING DROPS
OPHTHALMIC | Status: AC
  Administered 2016-11-12: 1 via OPHTHALMIC
  Filled 2016-11-12: qty 0.4

## 2016-11-12 MED ORDER — MIDAZOLAM HCL 2 MG/2ML IJ SOLN
INTRAMUSCULAR | Status: AC
  Filled 2016-11-12: qty 2

## 2016-11-12 SURGICAL SUPPLY — 16 items
GLOVE BIO SURGEON STRL SZ8 (GLOVE) ×3 IMPLANT
GLOVE BIOGEL M 6.5 STRL (GLOVE) ×3 IMPLANT
GLOVE SURG LX 8.0 MICRO (GLOVE) ×2
GLOVE SURG LX STRL 8.0 MICRO (GLOVE) ×1 IMPLANT
GOWN STRL REUS W/ TWL LRG LVL3 (GOWN DISPOSABLE) ×2 IMPLANT
GOWN STRL REUS W/TWL LRG LVL3 (GOWN DISPOSABLE) ×4
LABEL CATARACT MEDS ST (LABEL) ×3 IMPLANT
LENS IOL TECNIS ITEC 24.0 (Intraocular Lens) ×3 IMPLANT
PACK CATARACT (MISCELLANEOUS) ×3 IMPLANT
PACK CATARACT BRASINGTON LX (MISCELLANEOUS) ×3 IMPLANT
PACK EYE AFTER SURG (MISCELLANEOUS) ×3 IMPLANT
SOL BSS BAG (MISCELLANEOUS) ×3
SOLUTION BSS BAG (MISCELLANEOUS) ×1 IMPLANT
SYR 5ML LL (SYRINGE) ×3 IMPLANT
WATER STERILE IRR 250ML POUR (IV SOLUTION) ×3 IMPLANT
WIPE NON LINTING 3.25X3.25 (MISCELLANEOUS) ×3 IMPLANT

## 2016-11-12 NOTE — Discharge Instructions (Signed)
Eye Surgery Discharge Instructions  Expect mild scratchy sensation or mild soreness. DO NOT RUB YOUR EYE!  The day of surgery: . Minimal physical activity, but bed rest is not required . No reading, computer work, or close hand work . No bending, lifting, or straining. . May watch TV  For 24 hours: . No driving, legal decisions, or alcoholic beverages . Safety precautions . Eat anything you prefer: It is better to start with liquids, then soup then solid foods. . _____ Eye patch should be worn until postoperative exam tomorrow. . ____ Solar shield eyeglasses should be worn for comfort in the sunlight/patch while sleeping  Resume all regular medications including aspirin or Coumadin if these were discontinued prior to surgery. You may shower, bathe, shave, or wash your hair. Tylenol may be taken for mild discomfort.  Call your doctor if you experience significant pain, nausea, or vomiting, fever > 101 or other signs of infection. [phone] or [phone] Specific instructions:  Follow-up Information    Birder Robson, MD Follow up on 11/13/2016.   Specialty:  Ophthalmology Why:  at 10:10 Contact information: [address] [place] [address] [phone]

## 2016-11-12 NOTE — H&P (Signed)
All labs reviewed. Abnormal studies sent to patients PCP when indicated.  Previous H&P reviewed, patient examined, there are NO CHANGES.  Ariana Lee LOUIS10/23/20188:20 AM

## 2016-11-12 NOTE — Anesthesia Postprocedure Evaluation (Signed)
Anesthesia Post Note  Patient: Ariana Lee  Procedure(s) Performed: CATARACT EXTRACTION PHACO AND INTRAOCULAR LENS PLACEMENT (IOC)-RIGHT (Right Eye)  Patient location during evaluation: PACU Anesthesia Type: MAC Level of consciousness: awake, awake and alert and oriented Pain management: pain level controlled Vital Signs Assessment: post-procedure vital signs reviewed and stable Respiratory status: spontaneous breathing, nonlabored ventilation and respiratory function stable Cardiovascular status: stable Anesthetic complications: no     Last Vitals:  Vitals:   11/12/16 0738  BP: (!) 138/57  Pulse: 84  Resp: 17  Temp: (!) 35.7 C  SpO2: 99%    Last Pain:  Vitals:   11/12/16 0738  TempSrc: Tympanic                 Deshay Kirstein,  [facility]

## 2016-11-12 NOTE — Op Note (Signed)
PREOPERATIVE DIAGNOSIS:  Nuclear sclerotic cataract of the right eye.   POSTOPERATIVE DIAGNOSIS:  nuclear sclerotic cataract right eye   OPERATIVE PROCEDURE: Procedure(s): CATARACT EXTRACTION PHACO AND INTRAOCULAR LENS PLACEMENT (IOC)-RIGHT   SURGEON:  Birder Robson, MD.   ANESTHESIA:  Anesthesiologist: [facility], Jane Canary, MD CRNA: Nelda Marseille, CRNA  1.      Managed anesthesia care. 2.      0.60ml of Shugarcaine was instilled in the eye following the paracentesis.   COMPLICATIONS:  None.   TECHNIQUE:   Stop and chop   DESCRIPTION OF PROCEDURE:  The patient was examined and consented in the preoperative holding area where the aforementioned topical anesthesia was applied to the right eye and then brought back to the Operating Room where the right eye was prepped and draped in the usual sterile ophthalmic fashion and a lid speculum was placed. A paracentesis was created with the side port blade and the anterior chamber was filled with viscoelastic. A near clear corneal incision was performed with the steel keratome. A continuous curvilinear capsulorrhexis was performed with a cystotome followed by the capsulorrhexis forceps. Hydrodissection and hydrodelineation were carried out with BSS on a blunt cannula. The lens was removed in a stop and chop  technique and the remaining cortical material was removed with the irrigation-aspiration handpiece. The capsular bag was inflated with viscoelastic and the Technis [id number]  lens was placed in the capsular bag without complication. The remaining viscoelastic was removed from the eye with the irrigation-aspiration handpiece. The wounds were hydrated. The anterior chamber was flushed with Miostat and the eye was inflated to physiologic pressure. 0.92ml of Vigamox was placed in the anterior chamber. The wounds were found to be water tight. The eye was dressed with Vigamox. The patient was given protective glasses to wear throughout the day and a shield  with which to sleep tonight. The patient was also given drops with which to begin a drop regimen today and will follow-up with me in one day.  Implant Name Type Inv. Item Serial No. Manufacturer Lot No. LRB No. Used  LENS IOL DIOP 24.0 - [id number] Intraocular Lens LENS IOL DIOP 24.0 [id number] AMO   Right 1   Procedure(s) with comments: CATARACT EXTRACTION PHACO AND INTRAOCULAR LENS PLACEMENT (IOC)-RIGHT (Right) - [place] 00:51.1 AP% 19.7 CDE 10.08 Fluid pack lot # [id number]  Electronically signed: [place] 11/12/2016 9:16 AM

## 2016-11-12 NOTE — Anesthesia Procedure Notes (Signed)
Date/Time: 11/12/2016 8:59 AM Performed by: Nelda Marseille Pre-anesthesia Checklist: Patient identified, Emergency Drugs available, Suction available, Patient being monitored and Timeout performed Oxygen Delivery Method: Nasal cannula

## 2016-11-12 NOTE — Anesthesia Post-op Follow-up Note (Signed)
Anesthesia QCDR form completed.        

## 2016-11-12 NOTE — Anesthesia Preprocedure Evaluation (Signed)
Anesthesia Evaluation  Patient identified by MRN, date of birth, ID band Patient awake    Reviewed: Allergy & Precautions, NPO status , Patient's Chart, lab work & pertinent test results  Airway Mallampati: II       Dental  (+) Partial Lower, Partial Upper, Missing   Pulmonary sleep apnea , COPD, Current Smoker,     + decreased breath sounds      Cardiovascular Exercise Tolerance: Good hypertension, Pt. on medications + CAD, + Past MI, + Cardiac Stents, + Peripheral Vascular Disease and + Orthopnea   Rhythm:Regular Rate:Normal     Neuro/Psych negative neurological ROS  negative psych ROS   GI/Hepatic Neg liver ROS, GERD  Medicated,  Endo/Other  Morbid obesity  Renal/GU negative Renal ROS     Musculoskeletal   Abdominal (+) + obese,   Peds negative pediatric ROS (+)  Hematology   Anesthesia Other Findings   Reproductive/Obstetrics                             Anesthesia Physical Anesthesia Plan  ASA: III  Anesthesia Plan: MAC   Post-op Pain Management:    Induction: Intravenous  PONV Risk Score and Plan: 0  Airway Management Planned: Natural Airway and Nasal Cannula  Additional Equipment:   Intra-op Plan:   Post-operative Plan:   Informed Consent: I have reviewed the patients History and Physical, chart, labs and discussed the procedure including the risks, benefits and alternatives for the proposed anesthesia with the patient or authorized representative who has indicated his/her understanding and acceptance.     Plan Discussed with: CRNA  Anesthesia Plan Comments:         Anesthesia Quick Evaluation

## 2016-11-12 NOTE — Anesthesia Postprocedure Evaluation (Signed)
Anesthesia Post Note  Patient: Ariana Lee  Procedure(s) Performed: CATARACT EXTRACTION PHACO AND INTRAOCULAR LENS PLACEMENT (IOC)-RIGHT (Right Eye)  Patient location during evaluation: PACU Anesthesia Type: MAC Level of consciousness: awake Pain management: pain level controlled Vital Signs Assessment: post-procedure vital signs reviewed and stable Respiratory status: spontaneous breathing Cardiovascular status: stable Anesthetic complications: no     Last Vitals:  Vitals:   11/12/16 0920 11/12/16 0934  BP: 135/65 (!) 142/82  Pulse: 79 82  Resp: 16 18  Temp: 36.9 C   SpO2: 96% 100%    Last Pain:  Vitals:   11/12/16 0920  TempSrc: Oral                 VAN STAVEREN,Domitila Stetler

## 2016-11-12 NOTE — Transfer of Care (Signed)
Immediate Anesthesia Transfer of Care Note  Patient: Ariana Lee  Procedure(s) Performed: CATARACT EXTRACTION PHACO AND INTRAOCULAR LENS PLACEMENT (IOC)-RIGHT (Right Eye)  Patient Location: PACU  Anesthesia Type:MAC  Level of Consciousness: awake, alert  and oriented  Airway & Oxygen Therapy: Patient Spontanous Breathing  Post-op Assessment: Report given to RN and Post -op Vital signs reviewed and stable  Post vital signs: Reviewed and stable  Last Vitals:  Vitals:   11/12/16 0738  BP: (!) 138/57  Pulse: 84  Resp: 17  Temp: (!) 35.7 C  SpO2: 99%    Last Pain:  Vitals:   11/12/16 0738  TempSrc: Tympanic         Complications: No apparent anesthesia complications

## 2016-12-05 ENCOUNTER — Encounter: Payer: Self-pay | Admitting: *Deleted

## 2016-12-10 ENCOUNTER — Other Ambulatory Visit: Payer: Self-pay

## 2016-12-10 ENCOUNTER — Ambulatory Visit
Admission: RE | Admit: 2016-12-10 | Discharge: 2016-12-10 | Disposition: A | Discharge status: Home / Self Care | Payer: Medicare HMO | Source: Ambulatory Visit | Attending: Ophthalmology | Admitting: Ophthalmology

## 2016-12-10 ENCOUNTER — Encounter: Payer: Self-pay | Admitting: *Deleted

## 2016-12-10 ENCOUNTER — Ambulatory Visit: Payer: Medicare HMO | Admitting: Anesthesiology

## 2016-12-10 ENCOUNTER — Encounter
Admission: RE | Disposition: A | Discharge status: Home / Self Care | Payer: Self-pay | Source: Ambulatory Visit | Attending: Ophthalmology

## 2016-12-10 DIAGNOSIS — M1990 Unspecified osteoarthritis, unspecified site: Secondary | ICD-10-CM | POA: Insufficient documentation

## 2016-12-10 DIAGNOSIS — F17210 Nicotine dependence, cigarettes, uncomplicated: Secondary | ICD-10-CM | POA: Insufficient documentation

## 2016-12-10 DIAGNOSIS — Z791 Long term (current) use of non-steroidal anti-inflammatories (NSAID): Secondary | ICD-10-CM | POA: Diagnosis not present

## 2016-12-10 DIAGNOSIS — G4730 Sleep apnea, unspecified: Secondary | ICD-10-CM | POA: Diagnosis not present

## 2016-12-10 DIAGNOSIS — E669 Obesity, unspecified: Secondary | ICD-10-CM | POA: Diagnosis not present

## 2016-12-10 DIAGNOSIS — H2512 Age-related nuclear cataract, left eye: Secondary | ICD-10-CM | POA: Diagnosis not present

## 2016-12-10 DIAGNOSIS — J449 Chronic obstructive pulmonary disease, unspecified: Secondary | ICD-10-CM | POA: Diagnosis not present

## 2016-12-10 DIAGNOSIS — I739 Peripheral vascular disease, unspecified: Secondary | ICD-10-CM | POA: Insufficient documentation

## 2016-12-10 DIAGNOSIS — Z79899 Other long term (current) drug therapy: Secondary | ICD-10-CM | POA: Insufficient documentation

## 2016-12-10 DIAGNOSIS — K219 Gastro-esophageal reflux disease without esophagitis: Secondary | ICD-10-CM | POA: Insufficient documentation

## 2016-12-10 DIAGNOSIS — Z7982 Long term (current) use of aspirin: Secondary | ICD-10-CM | POA: Insufficient documentation

## 2016-12-10 DIAGNOSIS — I252 Old myocardial infarction: Secondary | ICD-10-CM | POA: Insufficient documentation

## 2016-12-10 DIAGNOSIS — I2510 Atherosclerotic heart disease of native coronary artery without angina pectoris: Secondary | ICD-10-CM | POA: Insufficient documentation

## 2016-12-10 HISTORY — PX: CATARACT EXTRACTION W/PHACO: SHX586

## 2016-12-10 HISTORY — DX: Personal history of other specified conditions: Z87.898

## 2016-12-10 SURGERY — PHACOEMULSIFICATION, CATARACT, WITH IOL INSERTION
Anesthesia: Monitor Anesthesia Care | Site: Eye | Laterality: Left | Wound class: Clean

## 2016-12-10 MED ORDER — ARMC OPHTHALMIC DILATING DROPS
1.0000 "application " | OPHTHALMIC | Status: AC
  Administered 2016-12-10 (×3): 1 via OPHTHALMIC

## 2016-12-10 MED ORDER — POVIDONE-IODINE 5 % OP SOLN
OPHTHALMIC | Status: AC
  Filled 2016-12-10: qty 30

## 2016-12-10 MED ORDER — MOXIFLOXACIN HCL 0.5 % OP SOLN: 1.0000 [drp] | OPHTHALMIC | Status: DC | PRN

## 2016-12-10 MED ORDER — MIDAZOLAM HCL 2 MG/2ML IJ SOLN
INTRAMUSCULAR | Status: AC
  Filled 2016-12-10: qty 2

## 2016-12-10 MED ORDER — MIDAZOLAM HCL 2 MG/2ML IJ SOLN
INTRAMUSCULAR | Status: DC | PRN
  Administered 2016-12-10 (×2): 1 mg via INTRAVENOUS

## 2016-12-10 MED ORDER — MOXIFLOXACIN HCL 0.5 % OP SOLN
OPHTHALMIC | Status: AC
  Filled 2016-12-10: qty 3

## 2016-12-10 MED ORDER — CARBACHOL 0.01 % IO SOLN
INTRAOCULAR | Status: DC | PRN
  Administered 2016-12-10: .5 mL via INTRAOCULAR

## 2016-12-10 MED ORDER — SODIUM CHLORIDE 0.9 % IV SOLN
INTRAVENOUS | Status: DC
  Administered 2016-12-10: 07:00:00 via INTRAVENOUS

## 2016-12-10 MED ORDER — LIDOCAINE HCL (PF) 4 % IJ SOLN
INTRAOCULAR | Status: DC | PRN
  Administered 2016-12-10: 2 mL via OPHTHALMIC

## 2016-12-10 MED ORDER — NA CHONDROIT SULF-NA HYALURON 40-17 MG/ML IO SOLN
INTRAOCULAR | Status: AC
  Filled 2016-12-10: qty 1

## 2016-12-10 MED ORDER — LIDOCAINE HCL (PF) 4 % IJ SOLN
INTRAMUSCULAR | Status: AC
  Filled 2016-12-10: qty 5

## 2016-12-10 MED ORDER — MOXIFLOXACIN HCL 0.5 % OP SOLN
OPHTHALMIC | Status: DC | PRN
  Administered 2016-12-10: .2 mL via OPHTHALMIC

## 2016-12-10 MED ORDER — EPINEPHRINE PF 1 MG/ML IJ SOLN
INTRAOCULAR | Status: DC | PRN
  Administered 2016-12-10: 1 mL via OPHTHALMIC

## 2016-12-10 MED ORDER — NA CHONDROIT SULF-NA HYALURON 40-17 MG/ML IO SOLN
INTRAOCULAR | Status: DC | PRN
  Administered 2016-12-10: 1 mL via INTRAOCULAR

## 2016-12-10 MED ORDER — POVIDONE-IODINE 5 % OP SOLN
OPHTHALMIC | Status: DC | PRN
  Administered 2016-12-10: 1 via OPHTHALMIC

## 2016-12-10 MED ORDER — ONDANSETRON HCL 4 MG/2ML IJ SOLN: 4.0000 mg | Freq: Once | INTRAMUSCULAR | Status: DC | PRN

## 2016-12-10 MED ORDER — ARMC OPHTHALMIC DILATING DROPS
OPHTHALMIC | Status: AC
  Filled 2016-12-10: qty 0.4

## 2016-12-10 MED ORDER — EPINEPHRINE PF 1 MG/ML IJ SOLN
INTRAMUSCULAR | Status: AC
  Filled 2016-12-10: qty 1

## 2016-12-10 MED ORDER — FENTANYL CITRATE (PF) 100 MCG/2ML IJ SOLN: 25.0000 ug | INTRAMUSCULAR | Status: DC | PRN

## 2016-12-10 SURGICAL SUPPLY — 16 items
GLOVE BIO SURGEON STRL SZ8 (GLOVE) ×3 IMPLANT
GLOVE BIOGEL M 6.5 STRL (GLOVE) ×3 IMPLANT
GLOVE SURG LX 8.0 MICRO (GLOVE) ×2
GLOVE SURG LX STRL 8.0 MICRO (GLOVE) ×1 IMPLANT
GOWN STRL REUS W/ TWL LRG LVL3 (GOWN DISPOSABLE) ×2 IMPLANT
GOWN STRL REUS W/TWL LRG LVL3 (GOWN DISPOSABLE) ×4
LABEL CATARACT MEDS ST (LABEL) ×3 IMPLANT
LENS IOL TECNIS ITEC 24.0 (Intraocular Lens) ×3 IMPLANT
PACK CATARACT (MISCELLANEOUS) ×3 IMPLANT
PACK CATARACT BRASINGTON LX (MISCELLANEOUS) ×3 IMPLANT
PACK EYE AFTER SURG (MISCELLANEOUS) ×3 IMPLANT
SOL BSS BAG (MISCELLANEOUS) ×3
SOLUTION BSS BAG (MISCELLANEOUS) ×1 IMPLANT
SYR 5ML LL (SYRINGE) ×3 IMPLANT
WATER STERILE IRR 250ML POUR (IV SOLUTION) ×3 IMPLANT
WIPE NON LINTING 3.25X3.25 (MISCELLANEOUS) ×3 IMPLANT

## 2016-12-10 NOTE — Anesthesia Preprocedure Evaluation (Signed)
Anesthesia Evaluation  Patient identified by MRN, date of birth, ID band Patient awake    Reviewed: Allergy & Precautions, NPO status , Patient's Chart, lab work & pertinent test results  History of Anesthesia Complications Negative for: history of anesthetic complications  Airway Mallampati: II  TM Distance: >3 FB Neck ROM: Full    Dental  (+) Upper Dentures, Partial Lower, Missing   Pulmonary sleep apnea , COPD,  COPD inhaler, Current Smoker,    breath sounds clear to auscultation- rhonchi (-) wheezing      Cardiovascular hypertension, (-) angina+ CAD, + Past MI and + Cardiac Stents (2003)   Rhythm:Regular Rate:Normal - Systolic murmurs and - Diastolic murmurs    Neuro/Psych negative neurological ROS  negative psych ROS   GI/Hepatic Neg liver ROS, GERD  ,  Endo/Other  negative endocrine ROSneg diabetes  Renal/GU negative Renal ROS     Musculoskeletal  (+) Arthritis ,   Abdominal (+) + obese,   Peds  Hematology negative hematology ROS (+)   Anesthesia Other Findings Past Medical History: No date: Allergy No date: Arthritis No date: Asthma No date: Coronary artery disease No date: GERD (gastroesophageal reflux disease) No date: History of orthopnea No date: Hypertension No date: Myocardial infarction [facility])     Comment:  2003 No date: Orthopnea No date: Peripheral vascular disease (HCC) No date: Sleep apnea     Comment:  TO BE TESTED   Reproductive/Obstetrics                             Anesthesia Physical Anesthesia Plan  ASA: III  Anesthesia Plan: MAC   Post-op Pain Management:    Induction: Intravenous  PONV Risk Score and Plan: 1 and Midazolam  Airway Management Planned: Natural Airway  Additional Equipment:   Intra-op Plan:   Post-operative Plan:   Informed Consent: I have reviewed the patients History and Physical, chart, labs and discussed the procedure  including the risks, benefits and alternatives for the proposed anesthesia with the patient or authorized representative who has indicated his/her understanding and acceptance.     Plan Discussed with: CRNA and Anesthesiologist  Anesthesia Plan Comments:         Anesthesia Quick Evaluation

## 2016-12-10 NOTE — Discharge Instructions (Signed)
Eye Surgery Discharge Instructions  Expect mild scratchy sensation or mild soreness. DO NOT RUB YOUR EYE!  The day of surgery:  Minimal physical activity, but bed rest is not required  No reading, computer work, or close hand work  No bending, lifting, or straining.  May watch TV  For 24 hours:  No driving, legal decisions, or alcoholic beverages  Safety precautions  Eat anything you prefer: It is better to start with liquids, then soup then solid foods.  _____ Eye patch should be worn until postoperative exam tomorrow.  ____ Solar shield eyeglasses should be worn for comfort in the sunlight/patch while sleeping  Resume all regular medications including aspirin or Coumadin if these were discontinued prior to surgery. You may shower, bathe, shave, or wash your hair. Tylenol may be taken for mild discomfort.  Call your doctor if you experience significant pain, nausea, or vomiting, fever > 101 or other signs of infection. [phone] or [phone] Specific instructions:  Follow-up Information    Birder Robson, MD Follow up.   Specialty:  Ophthalmology Why:  November 21 at 10:40am Contact information: [place] [address] [phone]

## 2016-12-10 NOTE — H&P (Signed)
   All labs reviewed. Abnormal studies sent to patients PCP when indicated.  Previous H&P reviewed, patient examined, there are NO CHANGES.  Ariana Lee LOUIS11/20/20188:15 AM

## 2016-12-10 NOTE — Transfer of Care (Signed)
Immediate Anesthesia Transfer of Care Note  Patient: Ariana Lee  Procedure(s) Performed: CATARACT EXTRACTION PHACO AND INTRAOCULAR LENS PLACEMENT (IOC) (Left Eye)  Patient Location: PACU and Short Stay  Anesthesia Type:MAC  Level of Consciousness: awake, alert  and oriented  Airway & Oxygen Therapy: Patient Spontanous Breathing  Post-op Assessment: Report given to RN and Post -op Vital signs reviewed and stable  Post vital signs: Reviewed and stable  Last Vitals:  Vitals:   12/10/16 0702 12/10/16 0845  BP: (!) 141/70 (!) 117/50  Pulse: 81 76  Resp: 18 16  Temp: 37 C   SpO2: 100% 99%    Last Pain:  Vitals:   12/10/16 0702  TempSrc: Temporal         Complications: No apparent anesthesia complications

## 2016-12-10 NOTE — Op Note (Signed)
PREOPERATIVE DIAGNOSIS:  Nuclear sclerotic cataract of the left eye.   POSTOPERATIVE DIAGNOSIS:  Nuclear sclerotic cataract of the left eye.   OPERATIVE PROCEDURE: Procedure(s): CATARACT EXTRACTION PHACO AND INTRAOCULAR LENS PLACEMENT (IOC)   SURGEON:  Birder Robson, MD.   ANESTHESIA:  Anesthesiologist: Emmie Niemann, MD CRNA: Jonna Clark, CRNA  1.      Managed anesthesia care. 2.     0.39ml of Shugarcaine was instilled following the paracentesis   COMPLICATIONS:  None.   TECHNIQUE:   Stop and chop   DESCRIPTION OF PROCEDURE:  The patient was examined and consented in the preoperative holding area where the aforementioned topical anesthesia was applied to the left eye and then brought back to the Operating Room where the left eye was prepped and draped in the usual sterile ophthalmic fashion and a lid speculum was placed. A paracentesis was created with the side port blade and the anterior chamber was filled with viscoelastic. A near clear corneal incision was performed with the steel keratome. A continuous curvilinear capsulorrhexis was performed with a cystotome followed by the capsulorrhexis forceps. Hydrodissection and hydrodelineation were carried out with BSS on a blunt cannula. The lens was removed in a stop and chop  technique and the remaining cortical material was removed with the irrigation-aspiration handpiece. The capsular bag was inflated with viscoelastic and the Technis [id number] lens was placed in the capsular bag without complication. The remaining viscoelastic was removed from the eye with the irrigation-aspiration handpiece. The wounds were hydrated. The anterior chamber was flushed with Miostat and the eye was inflated to physiologic pressure. 0.53ml Vigamox was placed in the anterior chamber. The wounds were found to be water tight. The eye was dressed with Vigamox. The patient was given protective glasses to wear throughout the day and a shield with which to sleep  tonight. The patient was also given drops with which to begin a drop regimen today and will follow-up with me in one day. Implant Name Type Inv. Item Serial No. Manufacturer Lot No. LRB No. Used  LENS IOL DIOP 24.0 - [id number] Intraocular Lens LENS IOL DIOP 24.0 [id number]  Left 1    Procedure(s) with comments: CATARACT EXTRACTION PHACO AND INTRAOCULAR LENS PLACEMENT (IOC) (Left) - [place] 00:51 AP% 19.9 CDE 10.24 Fluid pack lot # [id number]  Electronically signed: [place] 12/10/2016 8:42 AM

## 2016-12-10 NOTE — Anesthesia Postprocedure Evaluation (Signed)
Anesthesia Post Note  Patient: Ariana Lee  Procedure(s) Performed: CATARACT EXTRACTION PHACO AND INTRAOCULAR LENS PLACEMENT ([place]) (Left Eye)  Patient location during evaluation: Short Stay Anesthesia Type: MAC Level of consciousness: awake and alert and oriented Pain management: pain level controlled Vital Signs Assessment: post-procedure vital signs reviewed and stable Respiratory status: spontaneous breathing Cardiovascular status: stable Postop Assessment: no headache, adequate PO intake and no apparent nausea or vomiting Anesthetic complications: no     Last Vitals:  Vitals:   12/10/16 0841 12/10/16 0845  BP: (!) 117/50 (!) 117/50  Pulse: 75 76  Resp: 16 16  Temp: 36.5 C   SpO2: 99% 99%    Last Pain:  Vitals:   12/10/16 2263  TempSrc: Filbert Berthold

## 2016-12-10 NOTE — Anesthesia Post-op Follow-up Note (Signed)
Anesthesia QCDR form completed.        

## 2017-05-20 ENCOUNTER — Emergency Department
Admission: EM | Admit: 2017-05-20 | Discharge: 2017-05-20 | Disposition: A | Discharge status: Home / Self Care | Payer: Medicare HMO | Attending: Emergency Medicine | Admitting: Emergency Medicine

## 2017-05-20 ENCOUNTER — Encounter: Payer: Self-pay | Admitting: Emergency Medicine

## 2017-05-20 ENCOUNTER — Other Ambulatory Visit: Payer: Self-pay

## 2017-05-20 DIAGNOSIS — Z7982 Long term (current) use of aspirin: Secondary | ICD-10-CM | POA: Diagnosis not present

## 2017-05-20 DIAGNOSIS — Z955 Presence of coronary angioplasty implant and graft: Secondary | ICD-10-CM | POA: Diagnosis not present

## 2017-05-20 DIAGNOSIS — J45909 Unspecified asthma, uncomplicated: Secondary | ICD-10-CM | POA: Insufficient documentation

## 2017-05-20 DIAGNOSIS — F17210 Nicotine dependence, cigarettes, uncomplicated: Secondary | ICD-10-CM | POA: Diagnosis not present

## 2017-05-20 DIAGNOSIS — I2510 Atherosclerotic heart disease of native coronary artery without angina pectoris: Secondary | ICD-10-CM | POA: Diagnosis not present

## 2017-05-20 DIAGNOSIS — Z79899 Other long term (current) drug therapy: Secondary | ICD-10-CM | POA: Insufficient documentation

## 2017-05-20 DIAGNOSIS — I10 Essential (primary) hypertension: Secondary | ICD-10-CM | POA: Diagnosis not present

## 2017-05-20 DIAGNOSIS — N390 Urinary tract infection, site not specified: Secondary | ICD-10-CM | POA: Diagnosis not present

## 2017-05-20 DIAGNOSIS — R3989 Other symptoms and signs involving the genitourinary system: Secondary | ICD-10-CM | POA: Diagnosis present

## 2017-05-20 LAB — URINALYSIS, COMPLETE (UACMP) WITH MICROSCOPIC
BILIRUBIN URINE: NEGATIVE
Glucose, UA: NEGATIVE mg/dL
Hgb urine dipstick: NEGATIVE
Ketones, ur: NEGATIVE mg/dL
Nitrite: NEGATIVE
PH: 7 (ref 5.0–8.0)
Protein, ur: NEGATIVE mg/dL
SPECIFIC GRAVITY, URINE: 1.014 (ref 1.005–1.030)

## 2017-05-20 MED ORDER — CEPHALEXIN 500 MG PO CAPS: 500.0000 mg | ORAL_CAPSULE | Freq: Three times a day (TID) | ORAL | 0 refills | Status: DC

## 2017-05-20 NOTE — ED Provider Notes (Signed)
[facility] Emergency Department Provider Note  ____________________________________________   First MD Initiated Contact with Patient 05/20/17 [phone]     (approximate)  I have reviewed the triage vital signs and the nursing notes.   HISTORY  Chief Complaint Urinary Tract Infection   HPI Ariana Lee is a 77 y.o. female  is here with complaints of her urine having a bad odor.  She denies any frequency or dysuria.  Patient states that she has had a urinary tract infection approximately 3 months ago which cleared without any difficulties.  She is unaware of the antibiotic that her PCP placed her on.  At this time she denies any pain, fever, nausea, flank pain.  She denies any history of kidney stones.   Past Medical History:  Diagnosis Date  . Allergy   . Arthritis   . Asthma   . Coronary artery disease   . GERD (gastroesophageal reflux disease)   . History of orthopnea   . Hypertension   . Myocardial infarction ([place])    2003  . Orthopnea   . Peripheral vascular disease ([place])   . Sleep apnea    TO BE TESTED    Patient Active Problem List   Diagnosis Date Noted  . Weakness of both lower limbs 09/09/2014    Past Surgical History:  Procedure Laterality Date  . ABDOMINAL HYSTERECTOMY    . BREAST BIOPSY Left 1992   neg  . CATARACT EXTRACTION W/PHACO Right 11/12/2016   Procedure: CATARACT EXTRACTION PHACO AND INTRAOCULAR LENS PLACEMENT (IOC)-RIGHT;  Surgeon: Birder Robson, MD;  Location: [facility] ORS;  Service: Ophthalmology;  Laterality: Right;  [place] 00:51.1 AP% 19.7 CDE 10.08 Fluid pack lot # [id number]  . CATARACT EXTRACTION W/PHACO Left 12/10/2016   Procedure: CATARACT EXTRACTION PHACO AND INTRAOCULAR LENS PLACEMENT (IOC);  Surgeon: Birder Robson, MD;  Location: [facility] ORS;  Service: Ophthalmology;  Laterality: Left;  [place] 00:51 AP% 19.9 CDE 10.24 Fluid pack lot # [id number] H  . CESAREAN SECTION    . CORONARY ANGIOPLASTY     STENT 2003  .  SHOULDER ARTHROSCOPY     PINS  . VASCULAR SURGERY     STENT RIGHT LEG    Prior to Admission medications   Medication Sig Start Date End Date Taking? Authorizing Provider  albuterol (PROVENTIL HFA;VENTOLIN HFA) 108 (90 BASE) MCG/ACT inhaler Inhale 2 puffs into the lungs every 4 (four) hours as needed for wheezing or shortness of breath.    [provider]  aspirin (ASPIRIN EC) 81 MG EC tablet Take 81 mg by mouth daily. Swallow whole.    [provider]  atorvastatin (LIPITOR) 80 MG tablet Take 80 mg by mouth at bedtime.  08/30/14   [provider]  cephALEXin (KEFLEX) 500 MG capsule Take 1 capsule (500 mg total) by mouth 3 (three) times daily. 05/20/17   Letitia Neri L, PA-C  DUREZOL 0.05 % EMUL Place 1 drop 2 (two) times daily into the right eye.  10/31/16   [provider]  felodipine (PLENDIL) 10 MG 24 hr tablet Take 10 mg by mouth daily.    [provider]  hydrochlorothiazide (HYDRODIURIL) 25 MG tablet Take 25 mg by mouth daily.    [provider]  ILEVRO 0.3 % ophthalmic suspension BEGINNING 2 DAYS BEFORE SURGERY use 1 drop into affected eye once. 12/03/16   [provider]  latanoprost (XALATAN) 0.005 % ophthalmic solution Place 1 drop into both eyes at bedtime. 10/25/16   [provider]  metoprolol tartrate (LOPRESSOR) 50 MG tablet Take 50 mg 2 (two) times daily by mouth.     [provider]  naproxen (NAPROSYN) 250 MG tablet Take 250 mg daily as needed by mouth for mild pain (for pain.).     [provider]  nitroGLYCERIN (NITROSTAT) 0.3 MG SL tablet Place 0.3 mg under the tongue every 5 (five) minutes as needed for chest pain.    [provider]  nystatin (MYCOSTATIN/NYSTOP) powder Apply 1 g topically 2 (two) times daily as needed (applied under skin folds (stomach/breast)).    [provider]  omeprazole (PRILOSEC) 20 MG capsule Take 20 mg by mouth daily before breakfast.     [provider]  tiotropium (SPIRIVA) 18 MCG inhalation capsule Place 1 capsule daily as needed into inhaler and inhale for shortness of breath. 09/16/16 09/16/17  [provider]    Allergies Ivp dye [iodinated diagnostic agents] and Percocet [oxycodone-acetaminophen]  Family History  Problem Relation Age of Onset  . Prostate cancer Brother   . CAD Father   . Colon cancer Father     Social History Social History   Tobacco Use  . Smoking status: Current Every Day Smoker    Packs/day: 0.25    Types: Cigarettes  Substance Use Topics  . Alcohol use: No  . Drug use: Not on file    Review of Systems Constitutional: No fever/chills Eyes: No visual changes. Cardiovascular: Denies chest pain. Respiratory: Denies shortness of breath. Gastrointestinal: No abdominal pain.  No nausea, no vomiting.  Genitourinary: Negative for dysuria.  Positive for odor but negative for frequency. Musculoskeletal: Negative for back pain. Skin: Negative for rash. Neurological: Negative for headaches, focal weakness or numbness. ___________________________________________   PHYSICAL EXAM:  VITAL SIGNS: ED Triage Vitals  Enc Vitals Group     BP 05/20/17 0908 124/64     Pulse Rate 05/20/17 0908 91     Resp 05/20/17 0908 18     Temp 05/20/17 0908 98.2 F (36.8 C)     Temp Source 05/20/17 0908 Oral     SpO2 05/20/17 0908 98 %     Weight --      Height --      Head Circumference --      Peak Flow --      Pain Score 05/20/17 0911 0     Pain Loc --      Pain Edu? --      Excl. in [place]? --    Constitutional: Alert and oriented. Well appearing and in no acute distress. Eyes: Conjunctivae are normal.  Head: Atraumatic. Neck: No stridor.   Cardiovascular: Normal rate, regular rhythm. Grossly normal heart sounds.  Good peripheral circulation. Respiratory: Normal respiratory effort.  No retractions. Lungs CTAB. Gastrointestinal: Soft and nontender. No distention. No CVA  tenderness. Musculoskeletal: His upper and lower extremities without any difficulty and normal gait was noted. Neurologic:  Normal speech and language. No gross focal neurologic deficits are appreciated.  Skin:  Skin is warm, dry and intact. No rash noted. Psychiatric: Mood and affect are normal. Speech and behavior are normal.  ____________________________________________   LABS (all labs ordered are listed, but only abnormal results are displayed)  Labs Reviewed  URINALYSIS, COMPLETE (UACMP) WITH MICROSCOPIC - Abnormal; Notable for the following components:      Result Value   Color, Urine YELLOW (*)    APPearance HAZY (*)    Leukocytes, UA SMALL (*)    Bacteria, UA RARE (*)  All other components within normal limits  URINE CULTURE     PROCEDURES  Procedure(s) performed: None  Procedures  Critical Care performed: No  ____________________________________________   INITIAL IMPRESSION / ASSESSMENT AND PLAN / ED COURSE  Patient was made aware that her urine does show evidence of an acute UTI.  Culture was placed and she was made aware that her PCP could see these results.  Patient was placed on Keflex 500 mg 3 times daily for 10 days.  She is to increase fluids.  She will return to the ED if any severe worsening of her symptoms such as nausea, vomiting or high fever.  ____________________________________________   FINAL CLINICAL IMPRESSION(S) / ED DIAGNOSES  Final diagnoses:  Acute urinary tract infection     ED Discharge Orders        Ordered    cephALEXin (KEFLEX) 500 MG capsule  3 times daily     05/20/17 1023       Note:  This document was prepared using Dragon voice recognition software and may include unintentional dictation errors.    Johnn Hai, PA-C 05/20/17 1146    Lavonia Drafts, MD 05/20/17 1316

## 2017-05-20 NOTE — ED Notes (Signed)
See triage note  Presents with a 2 month hx of foul smelling urine  Denies any pain with urination

## 2017-05-20 NOTE — Discharge Instructions (Addendum)
Increase fluids and begin taking medication as directed. Follow up with your doctor if any continued problems.  Let them know that a culture was done.

## 2017-05-20 NOTE — ED Triage Notes (Signed)
Pt to ED via POV c/o urine odor. Pt denies painful burning or frequency. Pt is in NAD at this time.

## 2017-05-20 NOTE — ED Notes (Signed)
First Nurse Note:  Patient states her urine has a bad odor, denies any dysuria.  Given urine specimen cup.

## 2017-05-22 LAB — URINE CULTURE

## 2018-03-04 ENCOUNTER — Emergency Department: Payer: Medicare Other

## 2018-03-04 ENCOUNTER — Emergency Department
Admission: EM | Admit: 2018-03-04 | Discharge: 2018-03-04 | Disposition: A | Discharge status: Home / Self Care | Payer: Medicare Other | Attending: Student in an Organized Health Care Education/Training Program | Admitting: Student in an Organized Health Care Education/Training Program

## 2018-03-04 ENCOUNTER — Other Ambulatory Visit: Payer: Self-pay

## 2018-03-04 ENCOUNTER — Encounter: Payer: Self-pay | Admitting: Emergency Medicine

## 2018-03-04 DIAGNOSIS — Z7982 Long term (current) use of aspirin: Secondary | ICD-10-CM | POA: Insufficient documentation

## 2018-03-04 DIAGNOSIS — M11241 Other chondrocalcinosis, right hand: Secondary | ICD-10-CM | POA: Insufficient documentation

## 2018-03-04 DIAGNOSIS — I2510 Atherosclerotic heart disease of native coronary artery without angina pectoris: Secondary | ICD-10-CM | POA: Insufficient documentation

## 2018-03-04 DIAGNOSIS — M11841 Other specified crystal arthropathies, right hand: Secondary | ICD-10-CM | POA: Diagnosis not present

## 2018-03-04 DIAGNOSIS — M79641 Pain in right hand: Secondary | ICD-10-CM | POA: Diagnosis present

## 2018-03-04 DIAGNOSIS — Z79899 Other long term (current) drug therapy: Secondary | ICD-10-CM | POA: Insufficient documentation

## 2018-03-04 DIAGNOSIS — J45909 Unspecified asthma, uncomplicated: Secondary | ICD-10-CM | POA: Insufficient documentation

## 2018-03-04 DIAGNOSIS — I10 Essential (primary) hypertension: Secondary | ICD-10-CM | POA: Insufficient documentation

## 2018-03-04 DIAGNOSIS — M11849 Other specified crystal arthropathies, unspecified hand: Secondary | ICD-10-CM

## 2018-03-04 DIAGNOSIS — F17210 Nicotine dependence, cigarettes, uncomplicated: Secondary | ICD-10-CM | POA: Diagnosis not present

## 2018-03-04 DIAGNOSIS — M1120 Other chondrocalcinosis, unspecified site: Secondary | ICD-10-CM

## 2018-03-04 LAB — CBC WITH DIFFERENTIAL/PLATELET
Abs Immature Granulocytes: 0.02 10*3/uL (ref 0.00–0.07)
Basophils Absolute: 0.1 10*3/uL (ref 0.0–0.1)
Basophils Relative: 1 %
EOS PCT: 2 %
Eosinophils Absolute: 0.2 10*3/uL (ref 0.0–0.5)
HEMATOCRIT: 34.4 % — AB (ref 36.0–46.0)
HEMOGLOBIN: 10.5 g/dL — AB (ref 12.0–15.0)
Immature Granulocytes: 0 %
LYMPHS ABS: 2.7 10*3/uL (ref 0.7–4.0)
Lymphocytes Relative: 27 %
MCH: 23.8 pg — AB (ref 26.0–34.0)
MCHC: 30.5 g/dL (ref 30.0–36.0)
MCV: 77.8 fL — ABNORMAL LOW (ref 80.0–100.0)
MONO ABS: 0.7 10*3/uL (ref 0.1–1.0)
Monocytes Relative: 7 %
NRBC: 0 % (ref 0.0–0.2)
Neutro Abs: 6.2 10*3/uL (ref 1.7–7.7)
Neutrophils Relative %: 63 %
Platelets: 498 10*3/uL — ABNORMAL HIGH (ref 150–400)
RBC: 4.42 MIL/uL (ref 3.87–5.11)
RDW: 19.6 % — ABNORMAL HIGH (ref 11.5–15.5)
WBC: 9.9 10*3/uL (ref 4.0–10.5)

## 2018-03-04 LAB — BASIC METABOLIC PANEL
Anion gap: 8 (ref 5–15)
BUN: 18 mg/dL (ref 8–23)
CHLORIDE: 108 mmol/L (ref 98–111)
CO2: 23 mmol/L (ref 22–32)
Calcium: 8.9 mg/dL (ref 8.9–10.3)
Creatinine, Ser: 0.86 mg/dL (ref 0.44–1.00)
GFR calc Af Amer: >60 mL/min (ref >60)
GFR calc non Af Amer: >60 mL/min (ref >60)
Glucose, Bld: 104 mg/dL — ABNORMAL HIGH (ref 70–99)
POTASSIUM: 3.3 mmol/L — AB (ref 3.5–5.1)
Sodium: 139 mmol/L (ref 135–145)

## 2018-03-04 LAB — URIC ACID: Uric Acid, Serum: 6.7 mg/dL (ref 2.5–7.1)

## 2018-03-04 IMAGING — DX DG HAND COMPLETE 3+V*R*
3 series · 3 of 3 positions shown · non-contrast
Comparison: [DATE].

CLINICAL DATA: 77-year-old female with hand swelling and pain for 6
days. Possible gout.

EXAM:
RIGHT HAND - COMPLETE 3+ VIEW

[hand ap]
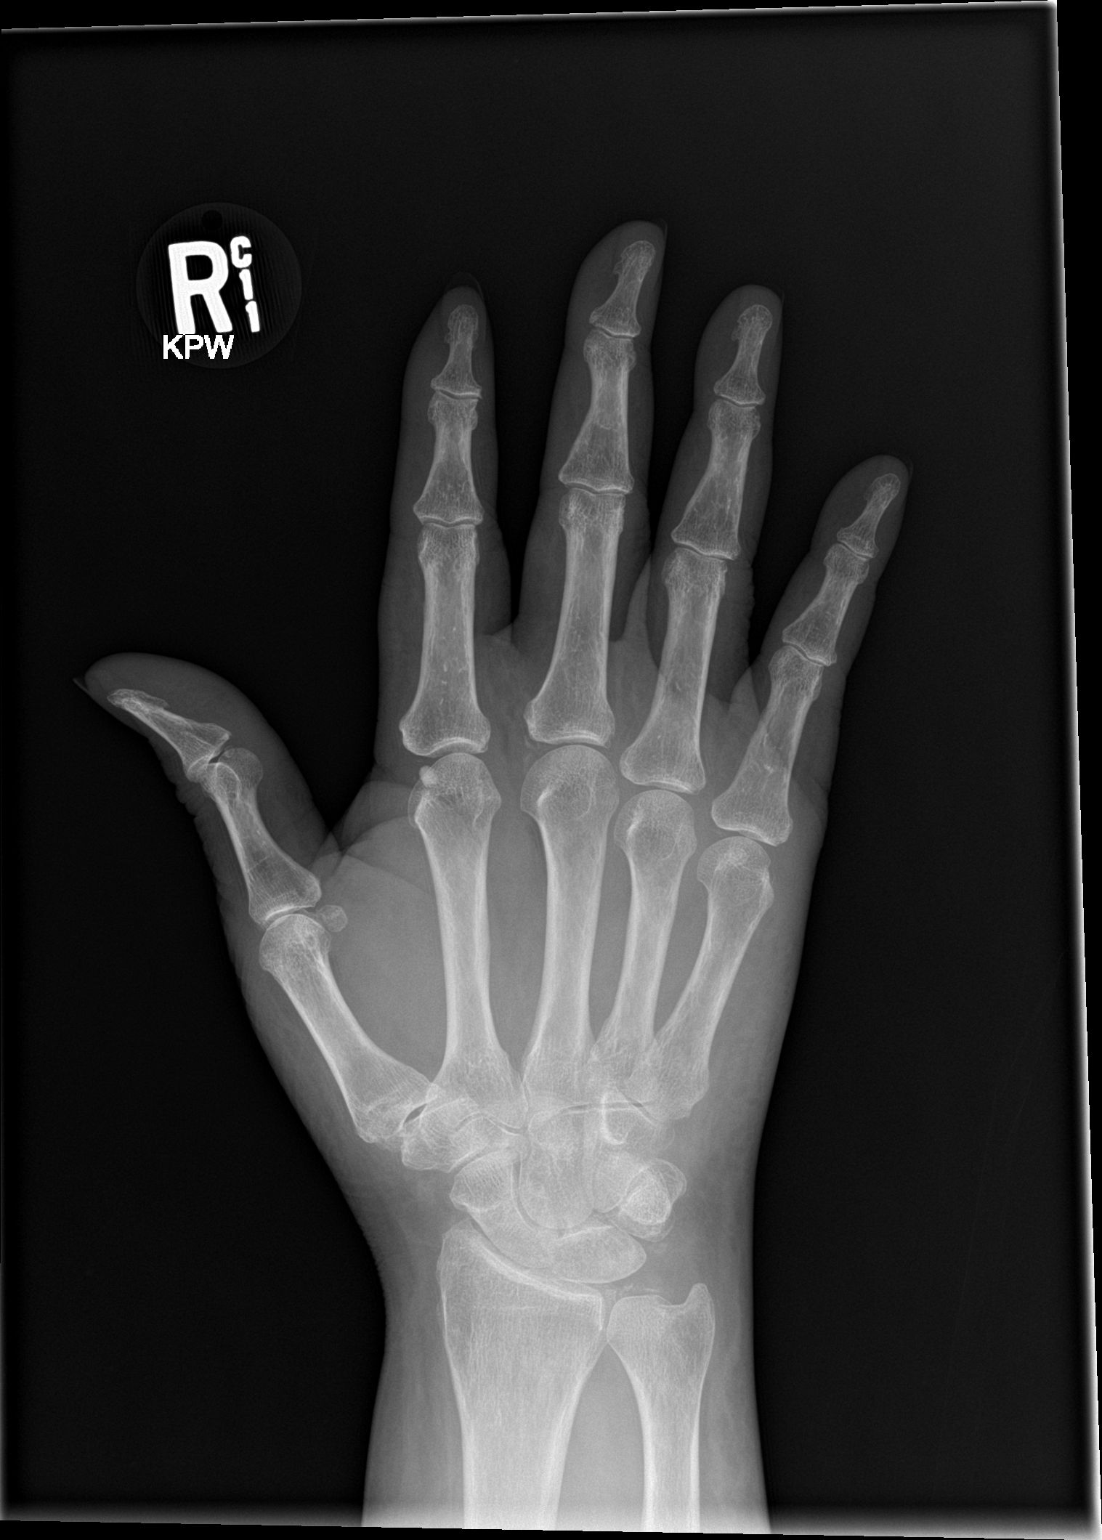

[hand obl]
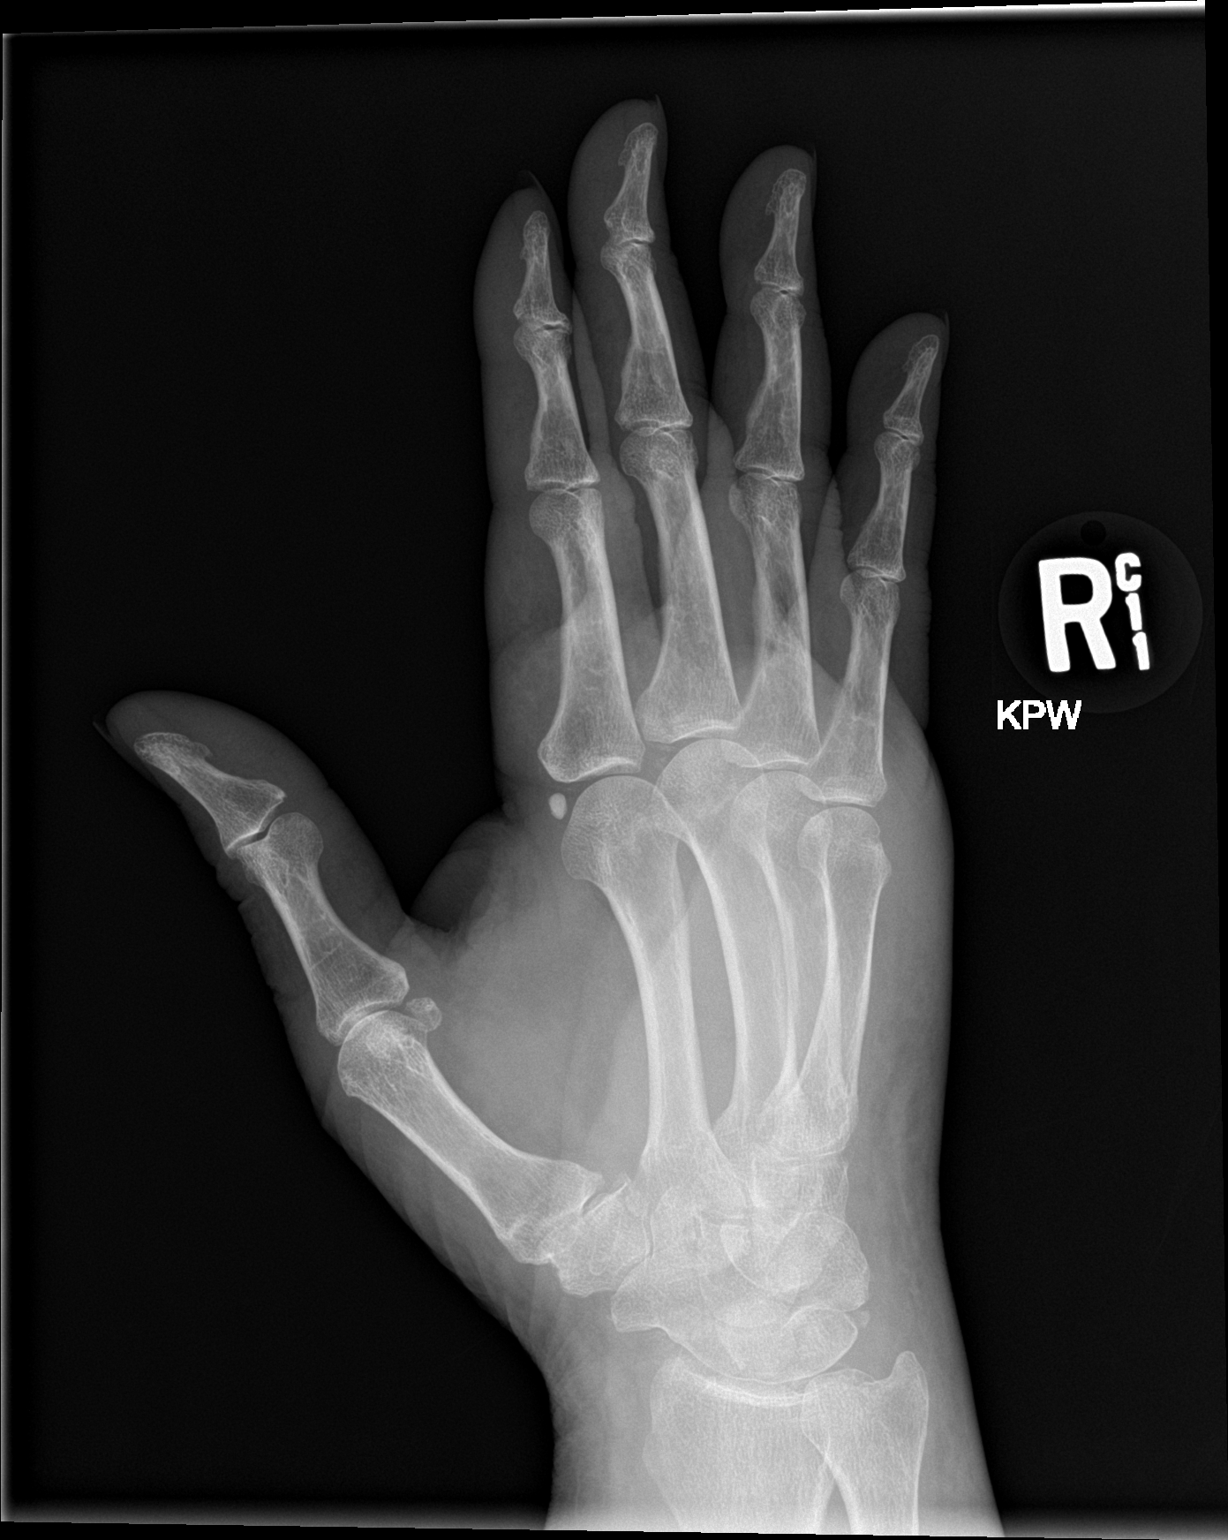

[hand lat]
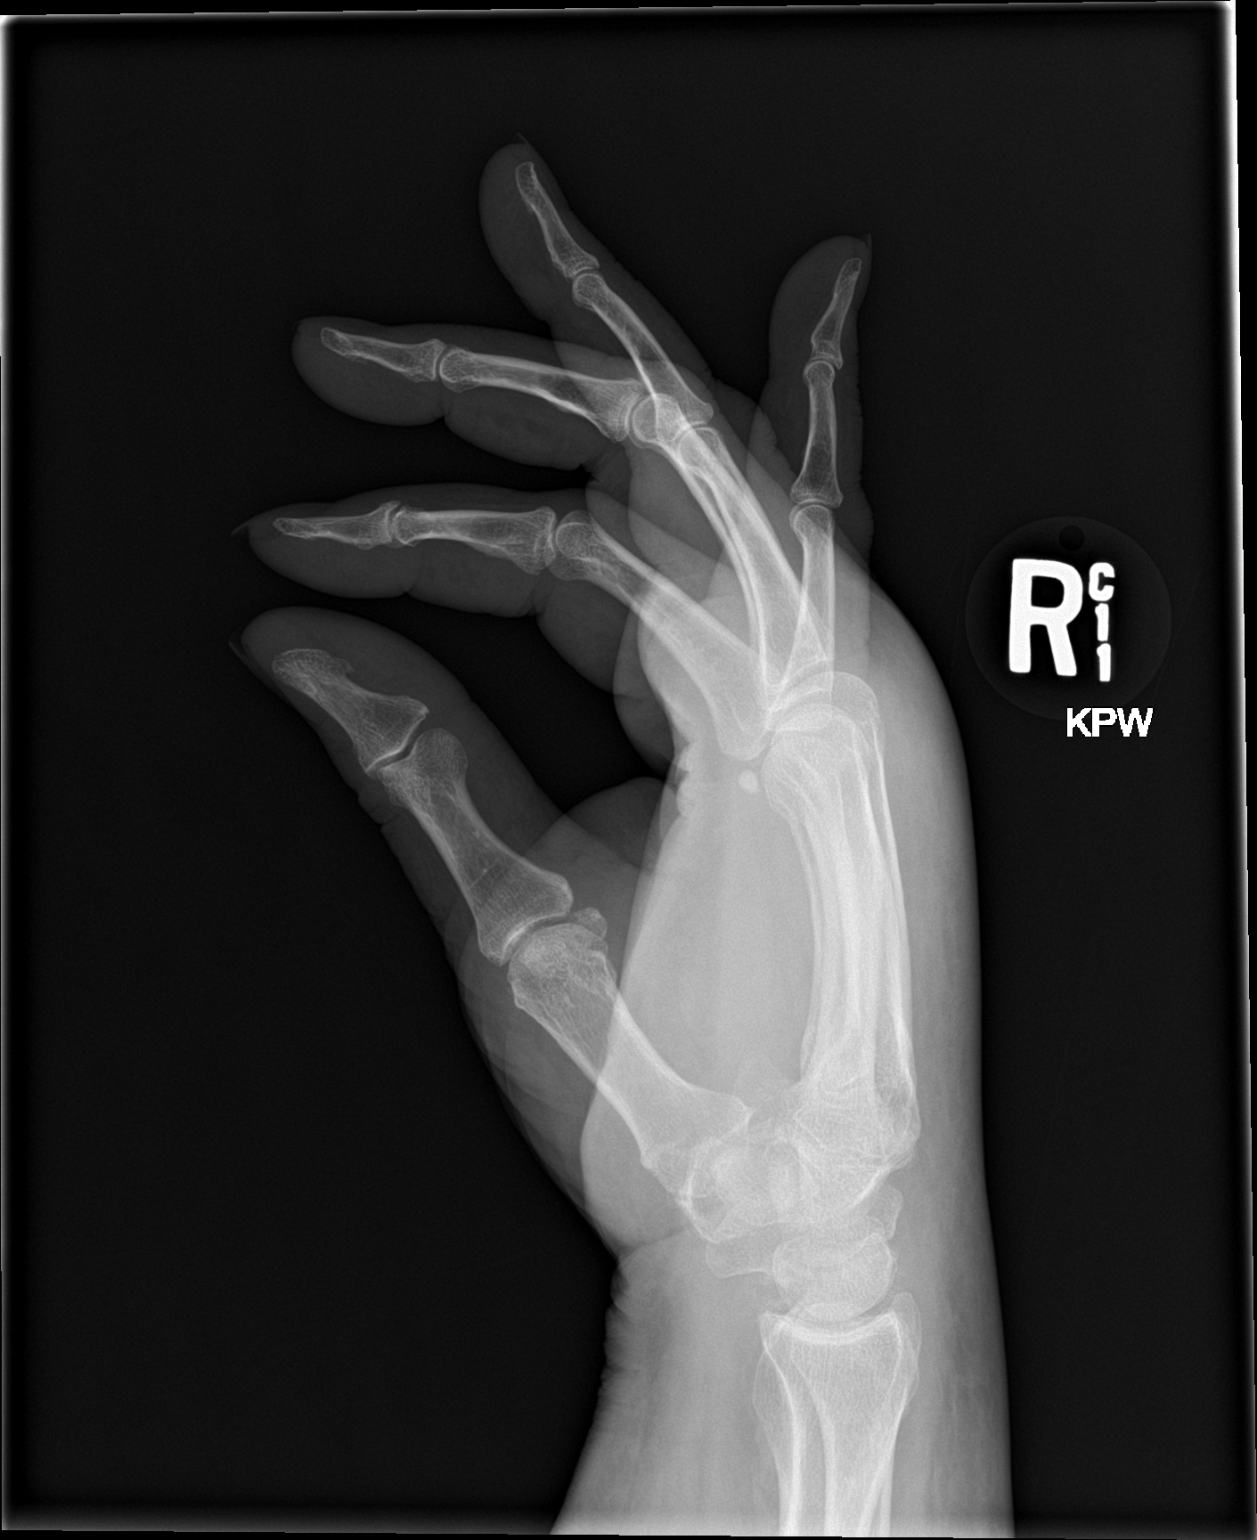

[3 of 3 positions shown; findings below may reference images not displayed]

FINDINGS: Generalized soft tissue swelling. No soft tissue gas. No radiopaque
foreign body identified. Chronic wrist chondrocalcinosis,
radiocarpal joint space loss. CMC and other joint spaces in the hand
are stable and normal for age. Bone mineralization is within normal
limits. No acute osseous abnormality identified.
IMPRESSION: Generalized soft tissue swelling. No acute osseous abnormality
identified.

Chronic wrist Chondrocalcinosis which can be seen in the setting of
calcium pyrophosphate deposition disease.

## 2018-03-04 MED ORDER — DICLOFENAC SODIUM 1 % TD GEL: 2.0000 g | Freq: Four times a day (QID) | TRANSDERMAL | 2 refills | Status: DC

## 2018-03-04 MED ORDER — PREDNISONE 10 MG (21) PO TBPK: ORAL_TABLET | ORAL | 0 refills | Status: DC

## 2018-03-04 MED ORDER — COLCHICINE 0.6 MG PO TABS: 0.6000 mg | ORAL_TABLET | Freq: Every day | ORAL | 2 refills | Status: DC

## 2018-03-04 NOTE — ED Notes (Signed)
See triage note  Presents with pain and swelling to right hand.  States sx's started on Thursday   Denies any injury  States she used ice last pm and states she was able to close her hand

## 2018-03-04 NOTE — ED Provider Notes (Signed)
[facility] Emergency Department Provider Note  ____________________________________________   First MD Initiated Contact with Patient 03/04/18 1048     (approximate)  I have reviewed the triage vital signs and the nursing notes.   HISTORY  Chief Complaint Hand Pain    HPI Ariana Lee is a 78 y.o. female presents to the emergency department complaint of pain and swelling to the right hand.  Symptoms started last Thursday which is approximately 6 days ago.  She used ice.  She has trouble closing her hand.  She denies any known injury.  She denies any cuts, as lacerations, nail injuries, or animal bites.   She states she has had swelling there before but it is never been like this.  She states her brother has gout and she thought that is what it was.   Past Medical History:  Diagnosis Date  . Allergy   . Arthritis   . Asthma   . Coronary artery disease   . GERD (gastroesophageal reflux disease)   . History of orthopnea   . Hypertension   . Myocardial infarction ([place])    2003  . Orthopnea   . Peripheral vascular disease ([place])   . Sleep apnea    TO BE TESTED    Patient Active Problem List   Diagnosis Date Noted  . Weakness of both lower limbs 09/09/2014    Past Surgical History:  Procedure Laterality Date  . ABDOMINAL HYSTERECTOMY    . BREAST BIOPSY Left 1992   neg  . CATARACT EXTRACTION W/PHACO Right 11/12/2016   Procedure: CATARACT EXTRACTION PHACO AND INTRAOCULAR LENS PLACEMENT (IOC)-RIGHT;  Surgeon: Birder Robson, MD;  Location: [facility] ORS;  Service: Ophthalmology;  Laterality: Right;  [place] 00:51.1 AP% 19.7 CDE 10.08 Fluid pack lot # [id number]  . CATARACT EXTRACTION W/PHACO Left 12/10/2016   Procedure: CATARACT EXTRACTION PHACO AND INTRAOCULAR LENS PLACEMENT (IOC);  Surgeon: Birder Robson, MD;  Location: [facility] ORS;  Service: Ophthalmology;  Laterality: Left;  [place] 00:51 AP% 19.9 CDE 10.24 Fluid pack lot # [id number] H  . CESAREAN  SECTION    . CORONARY ANGIOPLASTY     STENT 2003  . SHOULDER ARTHROSCOPY     PINS  . VASCULAR SURGERY     STENT RIGHT LEG    Prior to Admission medications   Medication Sig Start Date End Date Taking? Authorizing Provider  albuterol (PROVENTIL HFA;VENTOLIN HFA) 108 (90 BASE) MCG/ACT inhaler Inhale 2 puffs into the lungs every 4 (four) hours as needed for wheezing or shortness of breath.    [provider]  aspirin (ASPIRIN EC) 81 MG EC tablet Take 81 mg by mouth daily. Swallow whole.    [provider]  atorvastatin (LIPITOR) 80 MG tablet Take 80 mg by mouth at bedtime.  08/30/14   [provider]  colchicine 0.6 MG tablet Take 1 tablet (0.6 mg total) by mouth daily. 03/04/18 03/04/19  Kee Drudge, Linden Dolin, PA-C  diclofenac sodium (VOLTAREN) 1 % GEL Apply 2 g topically 4 (four) times daily. 03/04/18   Caryn Section, Linden Dolin, PA-C  DUREZOL 0.05 % EMUL Place 1 drop 2 (two) times daily into the right eye.  10/31/16   [provider]  felodipine (PLENDIL) 10 MG 24 hr tablet Take 10 mg by mouth daily.    [provider]  hydrochlorothiazide (HYDRODIURIL) 25 MG tablet Take 25 mg by mouth daily.    [provider]  ILEVRO 0.3 % ophthalmic suspension BEGINNING 2 DAYS BEFORE SURGERY use 1  drop into affected eye once. 12/03/16   [provider]  latanoprost (XALATAN) 0.005 % ophthalmic solution Place 1 drop into both eyes at bedtime. 10/25/16   [provider]  metoprolol tartrate (LOPRESSOR) 50 MG tablet Take 50 mg 2 (two) times daily by mouth.     [provider]  naproxen (NAPROSYN) 250 MG tablet Take 250 mg daily as needed by mouth for mild pain (for pain.).     [provider]  nitroGLYCERIN (NITROSTAT) 0.3 MG SL tablet Place 0.3 mg under the tongue every 5 (five) minutes as needed for chest pain.    [provider]  nystatin (MYCOSTATIN/NYSTOP) powder Apply 1 g topically 2 (two) times daily as needed (applied under  skin folds (stomach/breast)).    [provider]  omeprazole (PRILOSEC) 20 MG capsule Take 20 mg by mouth daily before breakfast.    [provider]  predniSONE (STERAPRED UNI-PAK 21 TAB) 10 MG (21) TBPK tablet Take 6 pills on day one then decrease by 1 pill each day 03/04/18   Versie Starks, PA-C  tiotropium [facility]) 18 MCG inhalation capsule Place 1 capsule daily as needed into inhaler and inhale for shortness of breath. 09/16/16 09/16/17  [provider]    Allergies Ivp dye [iodinated diagnostic agents] and Percocet [oxycodone-acetaminophen]  Family History  Problem Relation Age of Onset  . Prostate cancer Brother   . CAD Father   . Colon cancer Father     Social History Social History   Tobacco Use  . Smoking status: Current Every Day Smoker    Packs/day: 0.25    Types: Cigarettes  Substance Use Topics  . Alcohol use: No  . Drug use: Never    Review of Systems  Constitutional: No fever/chills Eyes: No visual changes. ENT: No sore throat. Respiratory: Denies cough Genitourinary: Negative for dysuria. Musculoskeletal: Negative for back pain.  Positive for right hand pain and swelling Skin: Negative for rash.    ____________________________________________   PHYSICAL EXAM:  VITAL SIGNS: ED Triage Vitals  Enc Vitals Group     BP 03/04/18 1004 (!) 126/59     Pulse Rate 03/04/18 1004 79     Resp 03/04/18 1004 16     Temp 03/04/18 1004 98.3 F (36.8 C)     Temp Source 03/04/18 1004 Oral     SpO2 03/04/18 1004 99 %     Weight 03/04/18 1007 200 lb (90.7 kg)     Height 03/04/18 1007 5\' 5"  (1.651 m)     Head Circumference --      Peak Flow --      Pain Score 03/04/18 1005 8     Pain Loc --      Pain Edu? --      Excl. in [place]? --     Constitutional: Alert and oriented. Well appearing and in no acute distress. Eyes: Conjunctivae are normal.  Head: Atraumatic. Nose: No congestion/rhinnorhea. Mouth/Throat: Mucous membranes are  moist.   Neck:  supple no lymphadenopathy noted Cardiovascular: Normal rate, regular rhythm. Heart sounds are normal Respiratory: Normal respiratory effort.  No retractions, lungs c t a  GU: deferred Musculoskeletal: Right hand is grossly red and swollen, area is tender to palpation, the redness extends into the wrist.   Neurologic:  Normal speech and language.  Skin:  Skin is warm, dry and intact. No rash noted. Psychiatric: Mood and affect are normal. Speech and behavior are normal.  ____________________________________________   LABS (all labs ordered  are listed, but only abnormal results are displayed)  Labs Reviewed  CBC WITH DIFFERENTIAL/PLATELET - Abnormal; Notable for the following components:      Result Value   Hemoglobin 10.5 (*)    HCT 34.4 (*)    MCV 77.8 (*)    MCH 23.8 (*)    RDW 19.6 (*)    Platelets 498 (*)    All other components within normal limits  BASIC METABOLIC PANEL - Abnormal; Notable for the following components:   Potassium 3.3 (*)    Glucose, Bld 104 (*)    All other components within normal limits  URIC ACID   ____________________________________________   ____________________________________________  RADIOLOGY  X-ray of the right hand is negative for fracture but does show calcium deposits  ____________________________________________   PROCEDURES  Procedure(s) performed: No  Procedures    ____________________________________________   INITIAL IMPRESSION / ASSESSMENT AND PLAN / ED COURSE  Pertinent labs & imaging results that were available during my care of the patient were reviewed by me and considered in my medical decision making (see chart for details).   Patient is a 78 year old female presents emergency department complaining of right hand pain with some redness and swelling.  She states redness and swelling are intermittent.  No injury.  Physical exam shows that the right hand does have some redness and swelling.  Area  is tender to palpation.  Neurovascular is intact.  X-ray of the right hand shows calcium deposits typical of pseudogout Explained the findings to the patient. CBC has a decreased hemoglobin at 10.5, uric acid is normal, potassium is a little decreased.  On review of past laboratory results these are typical of her normal values.  Patient was given a prescription for colchicine, Sterapred, and Voltaren gel.  She is to follow-up with her regular doctor if not better in 3 days.  Return emergency department for worsening.  On recheck of the hand the hand is actually decreased in redness from the initial exam.  She was discharged in stable condition.     As part of my medical decision making, I reviewed the following data within the [place] History obtained from family, Nursing notes reviewed and incorporated, Labs reviewed see above on lab review, Old chart reviewed, Radiograph reviewed x-ray of the right hand shows calcium deposits no fracture, Notes from prior ED visits and [place] Controlled Substance Database  ____________________________________________   FINAL CLINICAL IMPRESSION(S) / ED DIAGNOSES  Final diagnoses:  Calcium pyrophosphate arthropathy of hand  Pseudogout      NEW MEDICATIONS STARTED DURING THIS VISIT:  Discharge Medication List as of 03/04/2018  1:19 PM    START taking these medications   Details  colchicine 0.6 MG tablet Take 1 tablet (0.6 mg total) by mouth daily., Starting Wed 03/04/2018, Until Thu 03/04/2019, Normal    diclofenac sodium (VOLTAREN) 1 % GEL Apply 2 g topically 4 (four) times daily., Starting Wed 03/04/2018, Normal    predniSONE (STERAPRED UNI-PAK 21 TAB) 10 MG (21) TBPK tablet Take 6 pills on day one then decrease by 1 pill each day, Normal         Note:  This document was prepared using Dragon voice recognition software and may include unintentional dictation errors.    Versie Starks, PA-C 03/04/18 1411    Merlyn Lot, MD 03/04/18 [phone]

## 2018-03-04 NOTE — ED Triage Notes (Signed)
Patient complaining of right hand swelling and pain since last Thursday.  Denies injury.  States she believes it is gout because her brother has gout.  Hand visibly swollen.

## 2018-03-04 NOTE — Discharge Instructions (Addendum)
Follow-up with your regular doctor if not better in 3 to 4 days.  Return emergency department worsening.  Use medications as prescribed.

## 2020-09-12 ENCOUNTER — Inpatient Hospital Stay
Admission: EM | Admit: 2020-09-12 | Discharge: 2020-09-23 | DRG: 178 | Disposition: A | Discharge status: Home / Self Care | Payer: Medicare Other | Attending: Family Medicine | Admitting: Family Medicine

## 2020-09-12 ENCOUNTER — Emergency Department: Payer: Medicare Other

## 2020-09-12 ENCOUNTER — Observation Stay: Payer: Medicare Other

## 2020-09-12 ENCOUNTER — Other Ambulatory Visit: Payer: Self-pay

## 2020-09-12 DIAGNOSIS — U071 COVID-19: Secondary | ICD-10-CM | POA: Diagnosis present

## 2020-09-12 DIAGNOSIS — F17210 Nicotine dependence, cigarettes, uncomplicated: Secondary | ICD-10-CM | POA: Diagnosis present

## 2020-09-12 DIAGNOSIS — J449 Chronic obstructive pulmonary disease, unspecified: Secondary | ICD-10-CM | POA: Diagnosis present

## 2020-09-12 DIAGNOSIS — I248 Other forms of acute ischemic heart disease: Secondary | ICD-10-CM | POA: Diagnosis present

## 2020-09-12 DIAGNOSIS — I739 Peripheral vascular disease, unspecified: Secondary | ICD-10-CM | POA: Diagnosis present

## 2020-09-12 DIAGNOSIS — Z8249 Family history of ischemic heart disease and other diseases of the circulatory system: Secondary | ICD-10-CM

## 2020-09-12 DIAGNOSIS — W19XXXA Unspecified fall, initial encounter: Secondary | ICD-10-CM

## 2020-09-12 DIAGNOSIS — N1831 Chronic kidney disease, stage 3a: Secondary | ICD-10-CM | POA: Diagnosis present

## 2020-09-12 DIAGNOSIS — Z91041 Radiographic dye allergy status: Secondary | ICD-10-CM

## 2020-09-12 DIAGNOSIS — I2510 Atherosclerotic heart disease of native coronary artery without angina pectoris: Secondary | ICD-10-CM | POA: Diagnosis present

## 2020-09-12 DIAGNOSIS — C3412 Malignant neoplasm of upper lobe, left bronchus or lung: Secondary | ICD-10-CM | POA: Diagnosis present

## 2020-09-12 DIAGNOSIS — Z6833 Body mass index (BMI) 33.0-33.9, adult: Secondary | ICD-10-CM

## 2020-09-12 DIAGNOSIS — I129 Hypertensive chronic kidney disease with stage 1 through stage 4 chronic kidney disease, or unspecified chronic kidney disease: Secondary | ICD-10-CM | POA: Diagnosis present

## 2020-09-12 DIAGNOSIS — Z9221 Personal history of antineoplastic chemotherapy: Secondary | ICD-10-CM

## 2020-09-12 DIAGNOSIS — M1990 Unspecified osteoarthritis, unspecified site: Secondary | ICD-10-CM | POA: Diagnosis present

## 2020-09-12 DIAGNOSIS — C641 Malignant neoplasm of right kidney, except renal pelvis: Secondary | ICD-10-CM | POA: Diagnosis present

## 2020-09-12 DIAGNOSIS — R531 Weakness: Secondary | ICD-10-CM

## 2020-09-12 DIAGNOSIS — Z66 Do not resuscitate: Secondary | ICD-10-CM | POA: Diagnosis not present

## 2020-09-12 DIAGNOSIS — E876 Hypokalemia: Secondary | ICD-10-CM | POA: Diagnosis present

## 2020-09-12 DIAGNOSIS — N179 Acute kidney failure, unspecified: Secondary | ICD-10-CM | POA: Diagnosis present

## 2020-09-12 DIAGNOSIS — Z7982 Long term (current) use of aspirin: Secondary | ICD-10-CM

## 2020-09-12 DIAGNOSIS — K219 Gastro-esophageal reflux disease without esophagitis: Secondary | ICD-10-CM | POA: Diagnosis present

## 2020-09-12 DIAGNOSIS — I10 Essential (primary) hypertension: Secondary | ICD-10-CM | POA: Diagnosis present

## 2020-09-12 DIAGNOSIS — E860 Dehydration: Secondary | ICD-10-CM | POA: Diagnosis present

## 2020-09-12 DIAGNOSIS — N189 Chronic kidney disease, unspecified: Secondary | ICD-10-CM | POA: Diagnosis present

## 2020-09-12 DIAGNOSIS — E669 Obesity, unspecified: Secondary | ICD-10-CM | POA: Diagnosis present

## 2020-09-12 DIAGNOSIS — Z8042 Family history of malignant neoplasm of prostate: Secondary | ICD-10-CM

## 2020-09-12 DIAGNOSIS — Z800 Family history of malignant neoplasm of digestive organs: Secondary | ICD-10-CM

## 2020-09-12 DIAGNOSIS — C799 Secondary malignant neoplasm of unspecified site: Secondary | ICD-10-CM

## 2020-09-12 DIAGNOSIS — C3492 Malignant neoplasm of unspecified part of left bronchus or lung: Secondary | ICD-10-CM

## 2020-09-12 DIAGNOSIS — Z885 Allergy status to narcotic agent status: Secondary | ICD-10-CM

## 2020-09-12 DIAGNOSIS — Z9861 Coronary angioplasty status: Secondary | ICD-10-CM

## 2020-09-12 DIAGNOSIS — M4804 Spinal stenosis, thoracic region: Secondary | ICD-10-CM | POA: Diagnosis present

## 2020-09-12 DIAGNOSIS — K5900 Constipation, unspecified: Secondary | ICD-10-CM | POA: Diagnosis not present

## 2020-09-12 DIAGNOSIS — I252 Old myocardial infarction: Secondary | ICD-10-CM

## 2020-09-12 DIAGNOSIS — Z79899 Other long term (current) drug therapy: Secondary | ICD-10-CM

## 2020-09-12 DIAGNOSIS — C7951 Secondary malignant neoplasm of bone: Secondary | ICD-10-CM | POA: Diagnosis present

## 2020-09-12 DIAGNOSIS — E7800 Pure hypercholesterolemia, unspecified: Secondary | ICD-10-CM | POA: Diagnosis present

## 2020-09-12 HISTORY — DX: Malignant neoplasm of unspecified part of left bronchus or lung: C34.92

## 2020-09-12 LAB — HEPATIC FUNCTION PANEL
ALT: 21 U/L (ref 0–44)
AST: 26 U/L (ref 15–41)
Albumin: 3.1 g/dL — ABNORMAL LOW (ref 3.5–5.0)
Alkaline Phosphatase: 192 U/L — ABNORMAL HIGH (ref 38–126)
Bilirubin, Direct: 0.3 mg/dL — ABNORMAL HIGH (ref 0.0–0.2)
Indirect Bilirubin: 0.7 mg/dL (ref 0.3–0.9)
Total Bilirubin: 1 mg/dL (ref 0.3–1.2)
Total Protein: 6.2 g/dL — ABNORMAL LOW (ref 6.5–8.1)

## 2020-09-12 LAB — TROPONIN I (HIGH SENSITIVITY)
Troponin I (High Sensitivity): 65 ng/L — ABNORMAL HIGH (ref <18)
Troponin I (High Sensitivity): 72 ng/L — ABNORMAL HIGH (ref <18)

## 2020-09-12 LAB — BASIC METABOLIC PANEL
Anion gap: 17 — ABNORMAL HIGH (ref 5–15)
BUN: 20 mg/dL (ref 8–23)
CO2: 20 mmol/L — ABNORMAL LOW (ref 22–32)
Calcium: 9.3 mg/dL (ref 8.9–10.3)
Chloride: 103 mmol/L (ref 98–111)
Creatinine, Ser: 1.33 mg/dL — ABNORMAL HIGH (ref 0.44–1.00)
GFR, Estimated: 40 mL/min — ABNORMAL LOW (ref >60)
Glucose, Bld: 97 mg/dL (ref 70–99)
Potassium: 3.4 mmol/L — ABNORMAL LOW (ref 3.5–5.1)
Sodium: 140 mmol/L (ref 135–145)

## 2020-09-12 LAB — CBC
HCT: 42.6 % (ref 36.0–46.0)
Hemoglobin: 13.7 g/dL (ref 12.0–15.0)
MCH: 27.6 pg (ref 26.0–34.0)
MCHC: 32.2 g/dL (ref 30.0–36.0)
MCV: 85.7 fL (ref 80.0–100.0)
Platelets: 153 10*3/uL (ref 150–400)
RBC: 4.97 MIL/uL (ref 3.87–5.11)
RDW: 16.4 % — ABNORMAL HIGH (ref 11.5–15.5)
WBC: 16.7 10*3/uL — ABNORMAL HIGH (ref 4.0–10.5)
nRBC: 0 % (ref 0.0–0.2)

## 2020-09-12 LAB — RESP PANEL BY RT-PCR (FLU A&B, COVID) ARPGX2
Influenza A by PCR: NEGATIVE
Influenza B by PCR: NEGATIVE
SARS Coronavirus 2 by RT PCR: POSITIVE — AB

## 2020-09-12 LAB — CK: Total CK: 115 U/L (ref 38–234)

## 2020-09-12 LAB — BRAIN NATRIURETIC PEPTIDE: B Natriuretic Peptide: 124.3 pg/mL — ABNORMAL HIGH (ref 0.0–100.0)

## 2020-09-12 LAB — PROCALCITONIN: Procalcitonin: 0.56 ng/mL

## 2020-09-12 LAB — CBG MONITORING, ED: Glucose-Capillary: 105 mg/dL — ABNORMAL HIGH (ref 70–99)

## 2020-09-12 IMAGING — CT CT T SPINE W/O CM
3 of 4 series · 10 of 33 positions shown, 11 images · non-contrast
Comparison: None.

CLINICAL DATA: Fall W19.XXXA ([NO]-CM)

EXAM:
CT THORACIC SPINE WITHOUT CONTRAST
TECHNIQUE: Multidetector CT images of the thoracic were obtained using the
standard protocol without intravenous contrast.

[Series 3: t spine axials · axial · 0.28mm/px · z∈[+1019,+1135]mm · 2 of 147 slices shown, 3 images]
[im 59/147  soft-tissue]
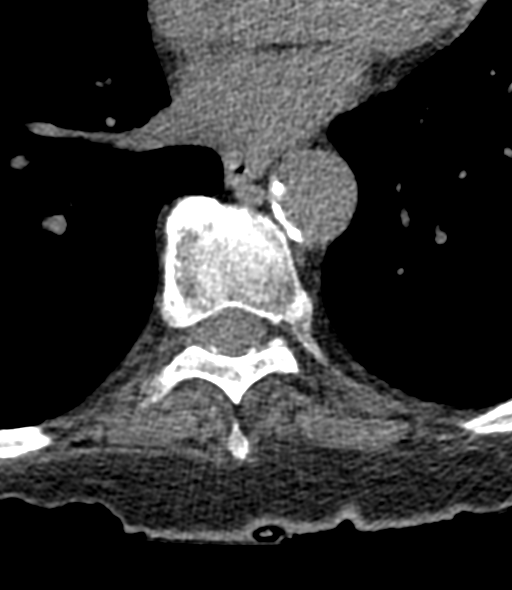
[im 59/147  bone]
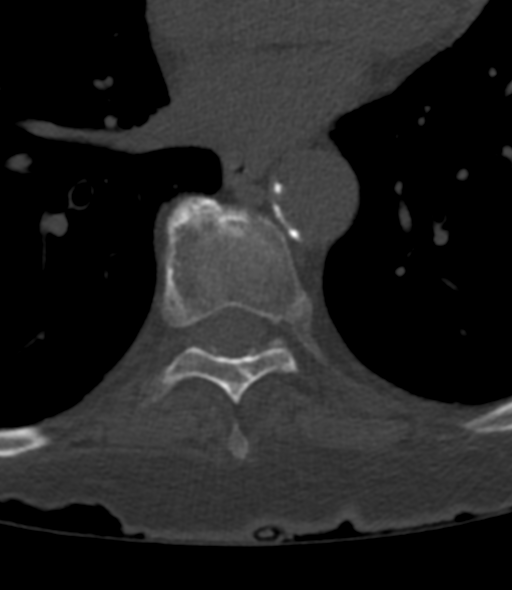
[im 117/147  bone]
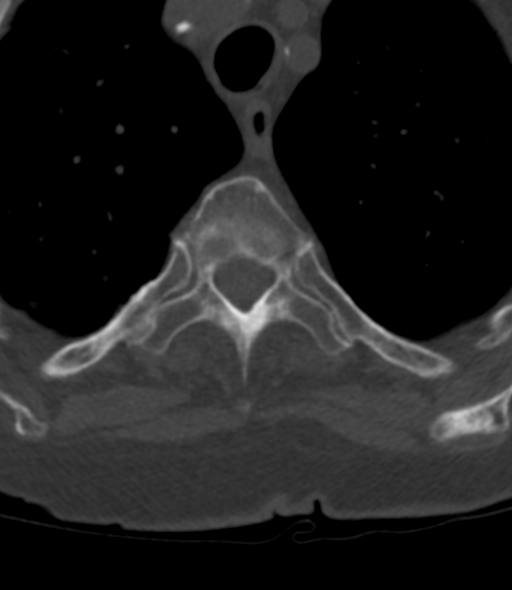

[Series 5: t spine cor · coronal · 0.28mm/px · 3 of 75 slices shown]
[im 15/75  bone]
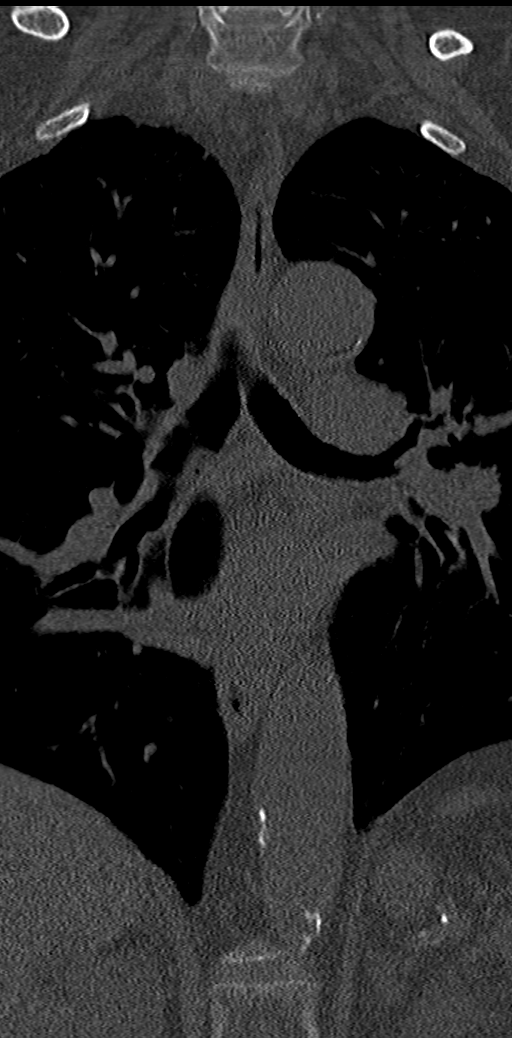
[im 30/75  bone]
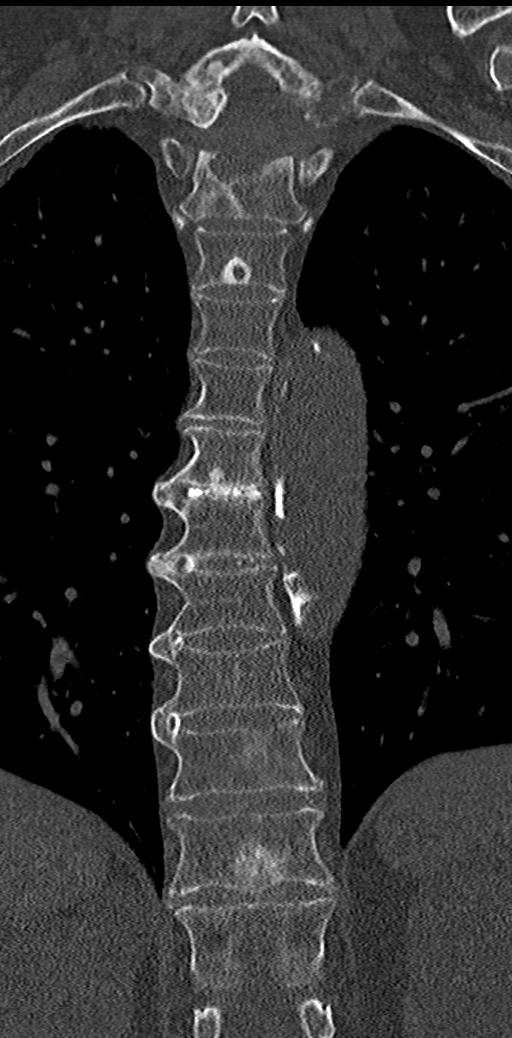
[im 45/75  bone]
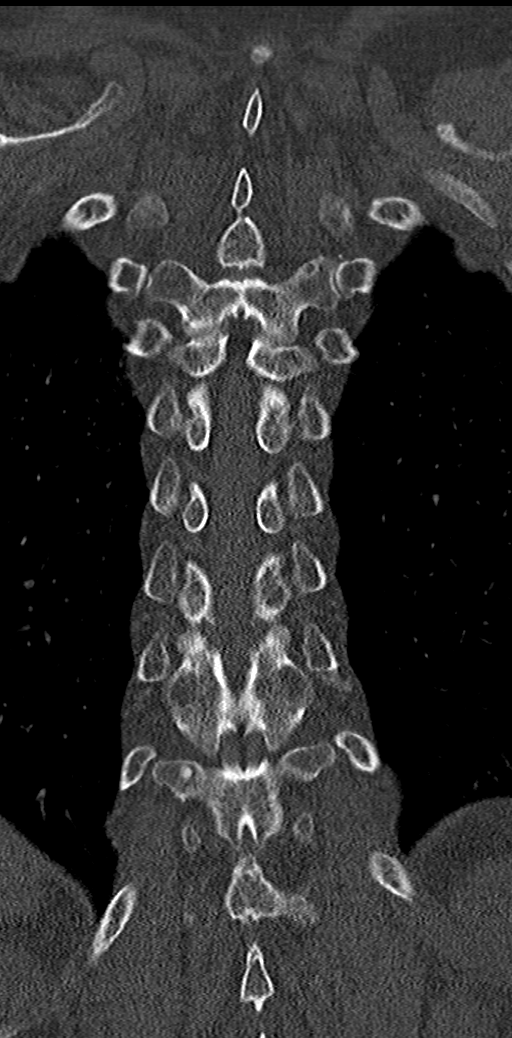

[Series 6: t spine sag · sagittal · 0.29mm/px · 5 of 49 slices shown]
[im 17/49  bone]
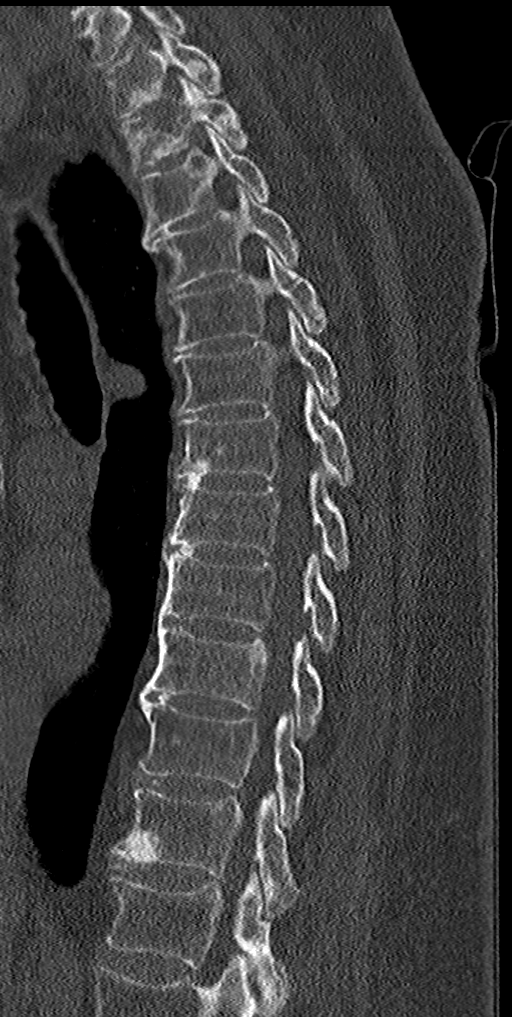
[im 21/49  bone]
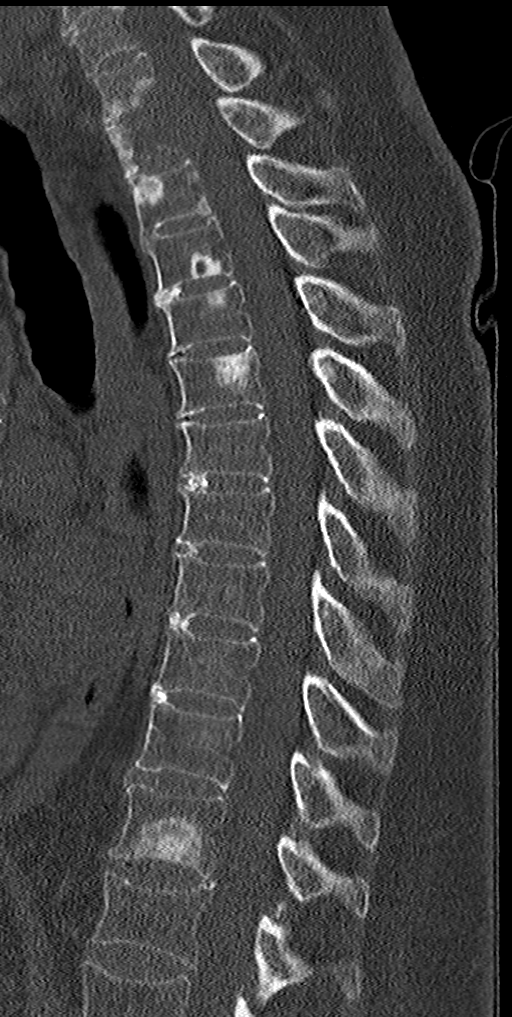
[im 25/49  bone]
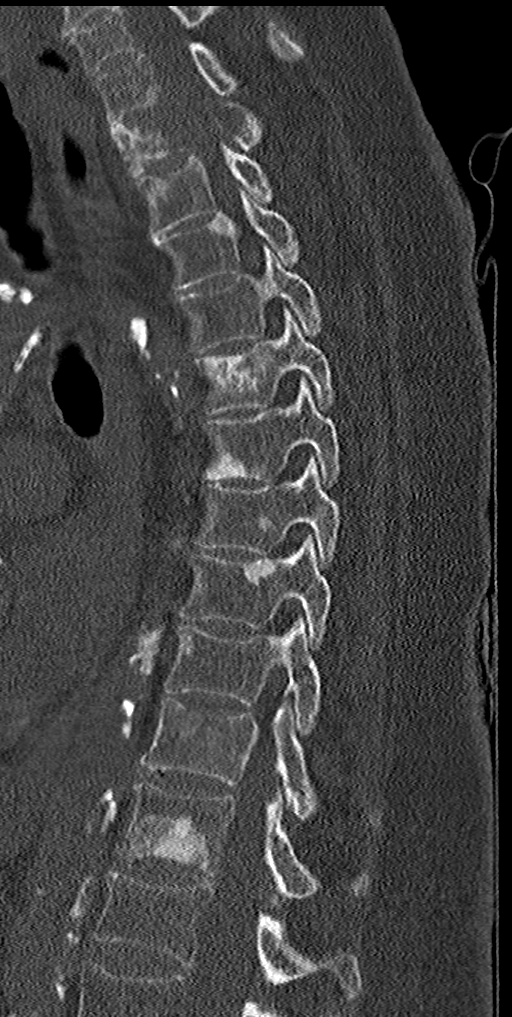
[im 29/49  bone]
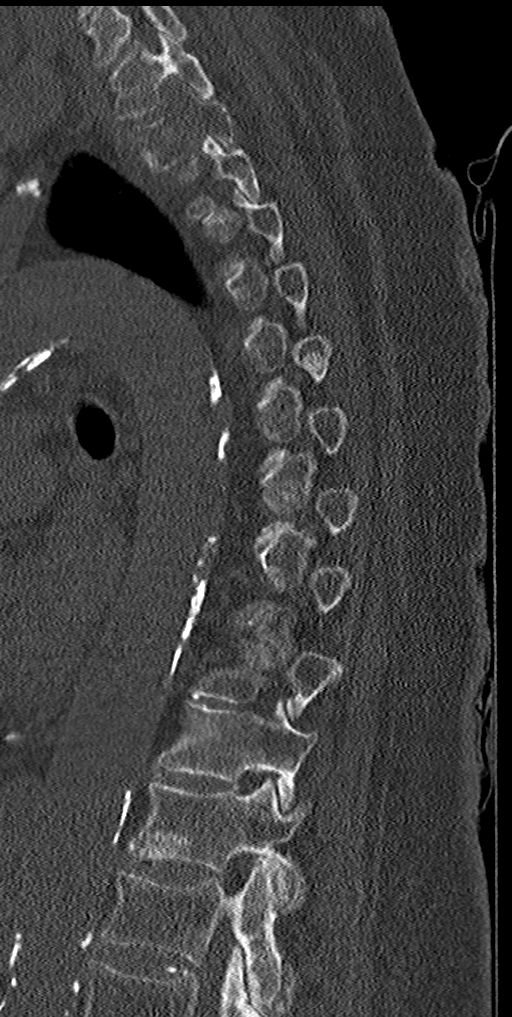
[im 33/49  bone]
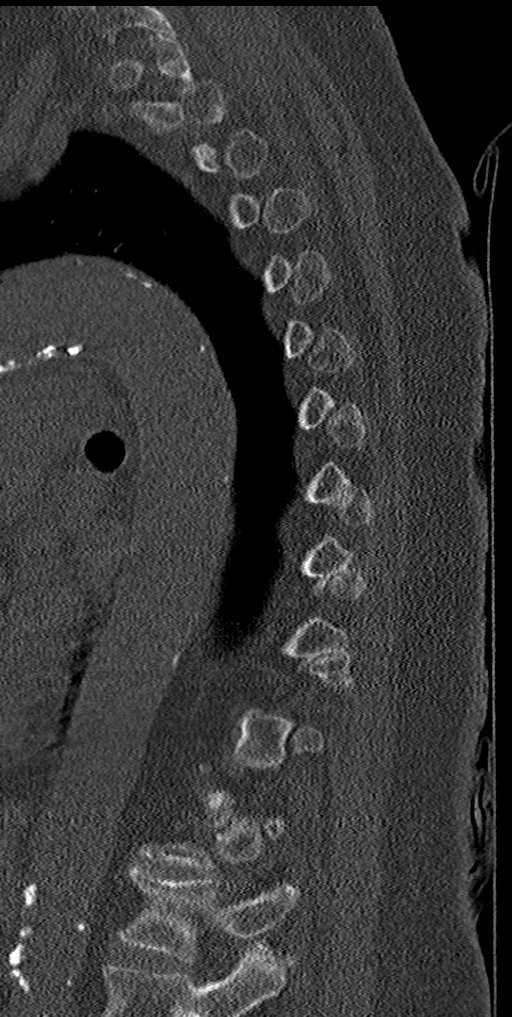

[10 of 33 positions shown; findings below may reference images not displayed]

FINDINGS: Alignment: No substantial sagittal subluxation.

Vertebrae: Numerous sclerotic lesions throughout the thoracic spine,
compatible with metastases. There likely lesions at every thoracic
level lesions involving the vertebral bodies and posterior elements.
The largest lesion is at T2 and is destructive with bony erosive
change of vertebral body and posterior elements. There is extensive
epidural extension tumor into the canal and left foramen with
associated canal and foraminal stenosis, potentially severe. There
is height loss of the T3 vertebral body, compatible with pathologic
fracture. Osteopenia.

Paraspinal and other soft tissues: Please see concurrent CT
chest/abdomen/pelvis for intrathoracic and intra-abdominal
evaluation

Disc levels: Epidural tumor at C2 level, described above. Mild
multilevel thoracic degenerative change with anterior osteophytes.
IMPRESSION: 1. Numerous metastases throughout the thoracic spine. The largest
lesion is at T2 with extensive bony erosive change of the vertebral
body and posterior elements and bulky epidural extension of tumor
into the canal and left foramen at this level. Resulting canal and
foraminal stenosis, likely severe. An MRI with contrast could
further evaluate the extent of epidural tumor and the canal, cord
and foramina if clinically indicated.
2. T2 vertebral body height loss, compatible with pathologic
fracture.
3. Osteopenia.
4. Please see concurrent CT chest/abdomen/pelvis for intrathoracic
and intra-abdominal evaluation

## 2020-09-12 IMAGING — CT CT CHEST-ABD-PELV W/O CM
2 of 4 series · 12 of 36 positions shown, 14 images · non-contrast
Comparison: Thoracic spine evaluation of the same date.

CLINICAL DATA: 80-year-old female with history of blunt trauma
after fall and weakness, thoracic back pain by report.

EXAM:
CT CHEST, ABDOMEN AND PELVIS WITHOUT CONTRAST
TECHNIQUE: Multidetector CT imaging of the chest, abdomen and pelvis was
performed following the standard protocol without IV contrast.

[Series 2: axial st · axial · 0.73mm/px · z∈[+670,+1140]mm · 9 of 116 slices shown, 11 images]
[im 11/116  mediastinal]
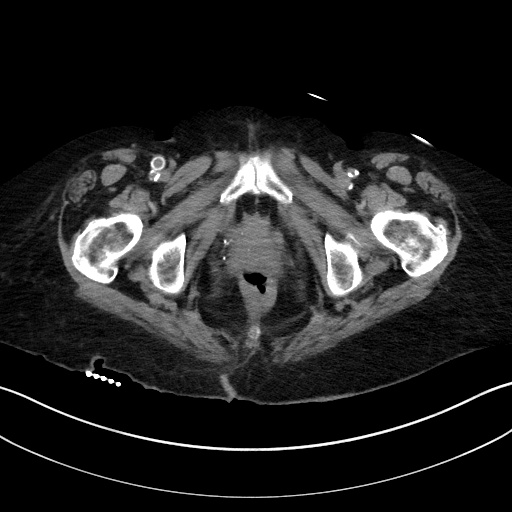
[im 11/116  bone]
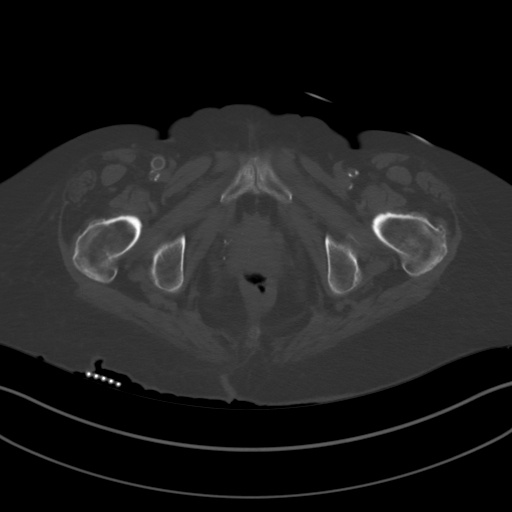
[im 21/116  mediastinal]
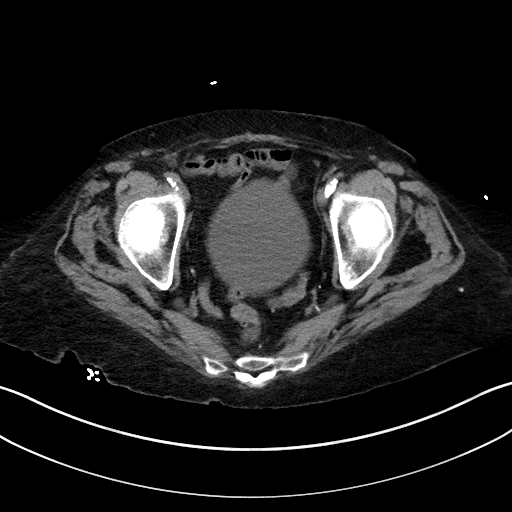
[im 32/116  mediastinal]
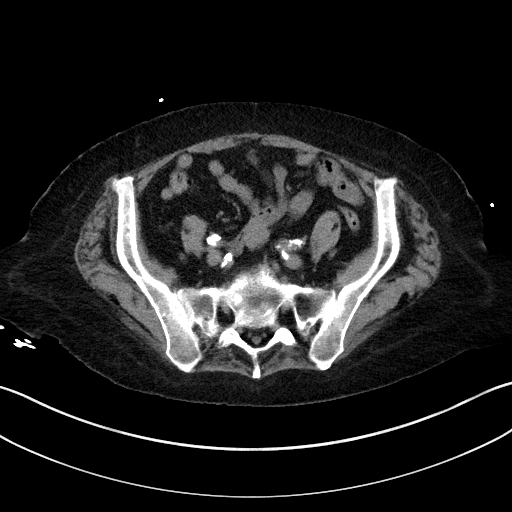
[im 42/116  mediastinal]
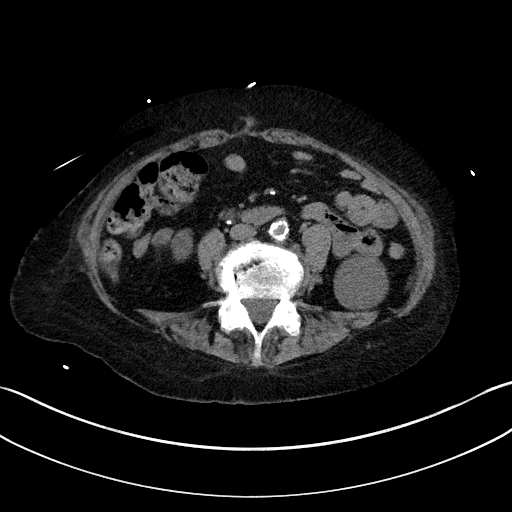
[im 63/116  mediastinal]
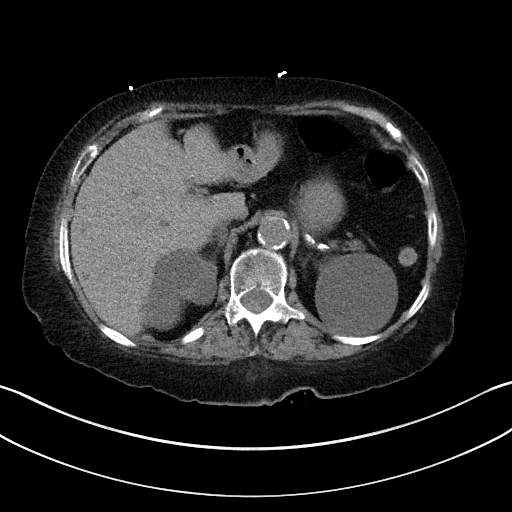
[im 74/116  mediastinal]
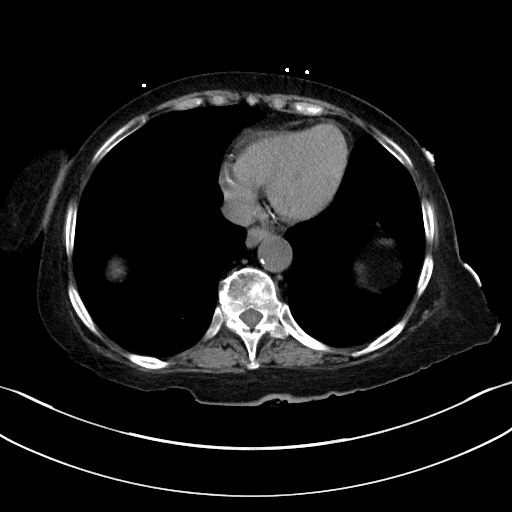
[im 84/116  mediastinal]
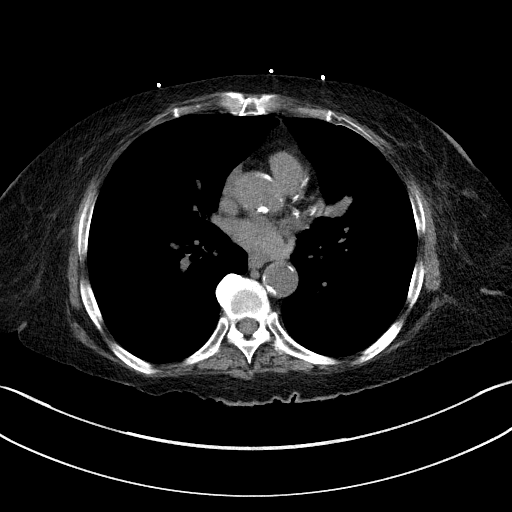
[im 95/116  mediastinal]
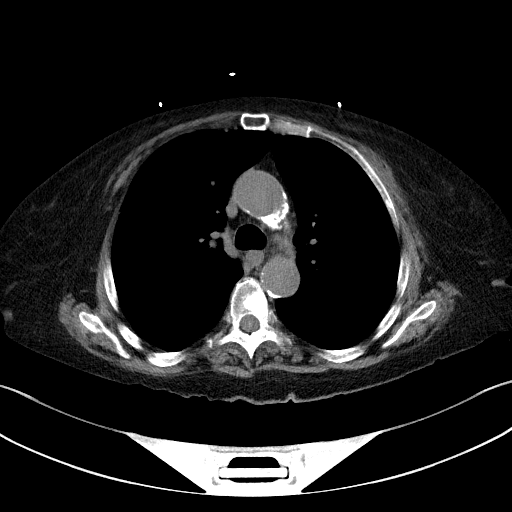
[im 105/116  mediastinal]
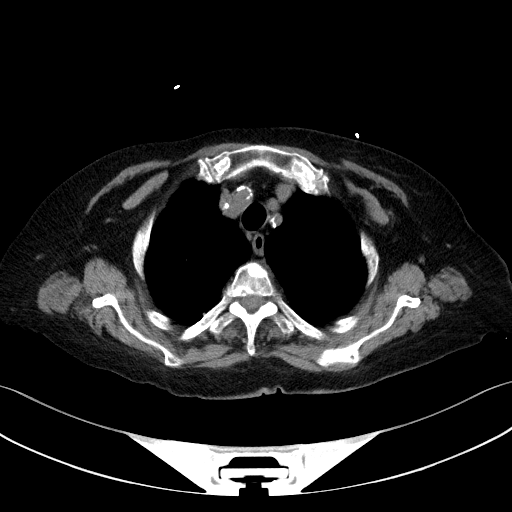
[im 105/116  bone]
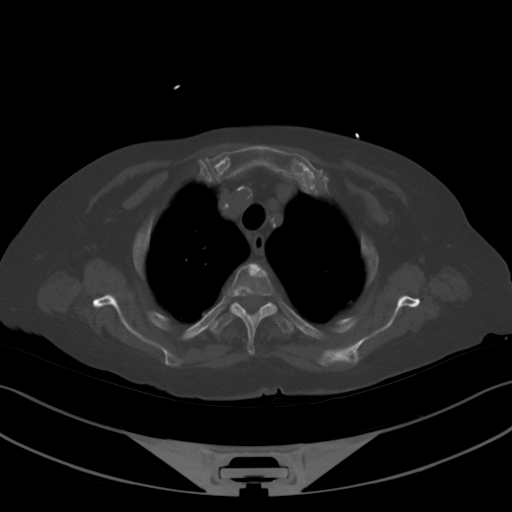

[Series 5: coronal · coronal · 0.72mm/px · 3 of 118 slices shown]
[im 24/118  mediastinal]
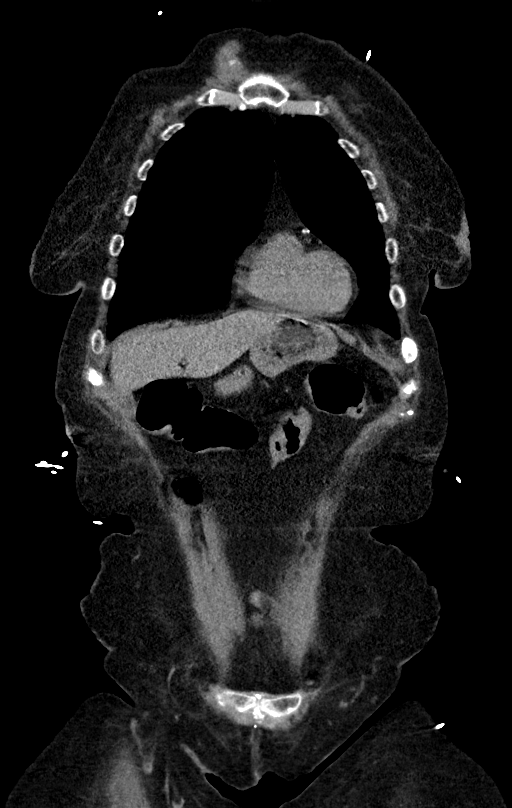
[im 47/118  mediastinal]
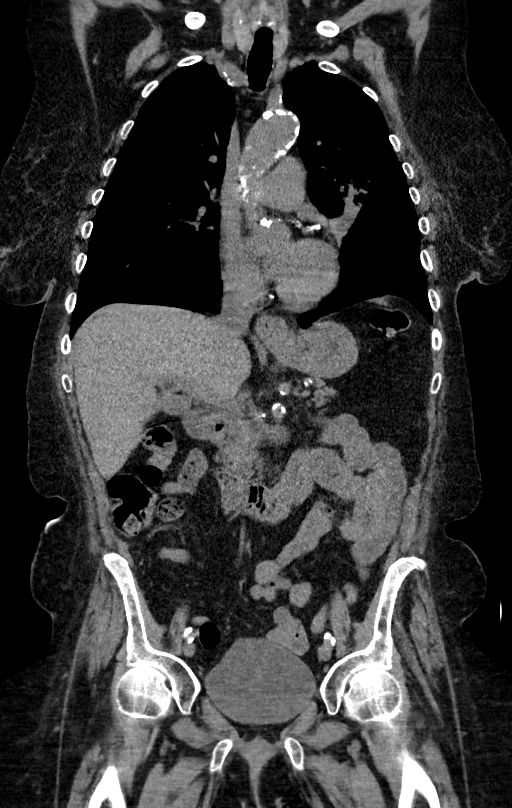
[im 71/118  mediastinal]
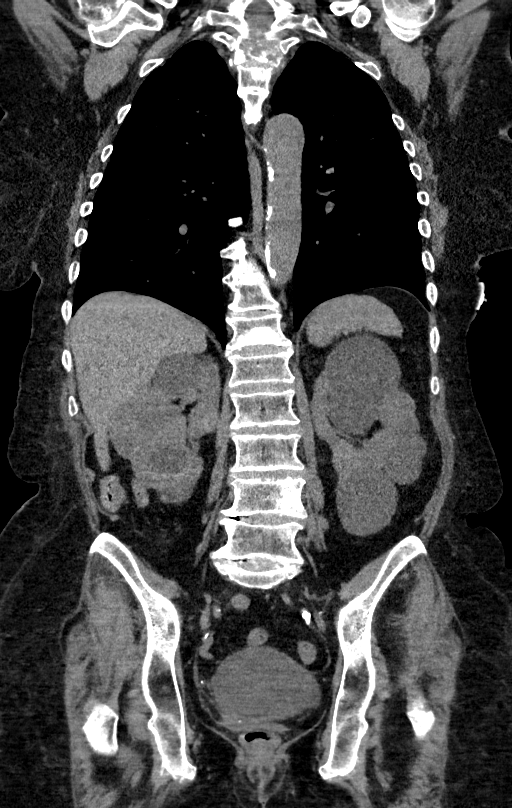

[12 of 36 positions shown; findings below may reference images not displayed]

FINDINGS: CT CHEST FINDINGS

Cardiovascular: Calcified atheromatous plaque of the thoracic aorta.
No aneurysmal dilation. Central pulmonary vasculature is
unremarkable. Heart size is normal without pericardial effusion.
Three-vessel coronary artery disease. Limited assessment of
cardiovascular structures given lack of intravenous contrast.

Mediastinum/Nodes: Thoracic inlet structures are unremarkable. No
axillary lymphadenopathy. No mediastinal adenopathy. Fullness of the
LEFT hilum with central pulmonary mass, see below. Esophagus grossly
normal.

Lungs/Pleura: LEFT upper lobe mass contiguous with LEFT hilum with
spiculated morphologic features and distortion of the LEFT hilum,
associated with some volume loss extending into the lingula. In the
axial plane this

Scattered pulmonary nodules throughout the chest, for instance on
image measures approximately 3.2 x 2.0 cm. [DATE] there is a small 5
mm RIGHT middle lobe pulmonary nodule. Numerous other tiny nodules
in the 4-5 mm range seen in the bilateral chest. No pleural
effusion. Small amount of material layering in the RIGHT mainstem
bronchus, airways otherwise unremarkable aside from findings
described above.

Musculoskeletal: Multiple sclerotic lesions throughout the spine,
see dedicated spine evaluation for further detail. Additionally,
there is destruction of the T2 thoracic vertebral body with soft
tissue extending into the central canal narrowing the central canal
at this level. Destruction of posterior T2 vertebral body, LEFT
pedicle and with other findings better displayed in reported
separately on dedicated thoracic spine CT. See below for full
musculoskeletal details.

CT ABDOMEN PELVIS FINDINGS

Hepatobiliary: Scattered small low-density lesions in the liver
incompletely characterized on noncontrast imaging some of which may
represent cysts. No comparison imaging is available. No
pericholecystic stranding. No gross biliary duct dilation.

Pancreas: Signs of pancreatic atrophy.

Spleen: Spleen is normal size without visible abnormality.

Adrenals/Urinary Tract: Bilateral adrenal thickening displays
low-density and maintains adreniform contours. This is more likely
related to adrenal hyperplasia particularly on the LEFT.

Cysts in the bilateral kidneys. No hydronephrosis. No perinephric
stranding. Urinary bladder with smooth contours. No adjacent
stranding.

RIGHT-sided renal lesion in the interpolar RIGHT kidney does not
meet criteria for cyst and shows heterogeneity measuring 31
Hounsfield units, at 4.5 x 4.3 cm, incompletely characterized on
noncontrast imaging.

Fifty Hounsfield unit density, homogeneous lesion arising from the
medial RIGHT kidney measures 2.5 x 2.0 cm arising from the upper
posterior hilar lip.

Stomach/Bowel: No acute gastrointestinal process.

Vascular/Lymphatic: Calcified atheromatous plaque in the abdominal
aorta without aneurysmal dilation. There is no gastrohepatic or
hepatoduodenal ligament lymphadenopathy. No retroperitoneal or
mesenteric lymphadenopathy.

No pelvic sidewall lymphadenopathy.

Reproductive: Post hysterectomy.

Other: No ascites. No sign of free air. RIGHT inguinal hernia
contains fat.

Musculoskeletal: Numerous sclerotic foci, see thoracic spine imaging
for destructive lesion at T2 which shows canal compromise.

Sclerotic lesions also present in the lumbar spine mixed lytic and
sclerotic focus at the L4 vertebral level.

Sclerotic lesions also in the bony pelvis with destructive lesion in
the LEFT greater trochanter showing early bony destruction and
subtle sclerosis. Sclerotic focus on image 83 of series 2 measuring
approximately cm in the LEFT iliac. Similar findings seen inferiorly
on image 92 of series 2 with more subtle scattered areas of
sclerosis elsewhere in the bony pelvis. Subtle area in the proximal
femur seen on image 107 of series [DATE] measure as much as 3.9 x
cm no sign of acute fracture of the bony pelvis.
IMPRESSION: 1. Findings suspicious for LEFT-sided pulmonary neoplasm with
extensive bony metastatic disease.
2. Marked central canal narrowing due to vertebral destruction at T2
better defined on the dedicated CT of the thoracic spine. MRI of the
thoracic spine may be helpful as there is severe central canal
narrowing associated with this finding.
3. Scattered pulmonary nodules suspicious for small pulmonary
metastases in the current context.
4. Findings suspicious for solid RIGHT renal lesions also raising
the question of solid renal neoplasms. Suggest follow-up ultrasound
evaluation as an initial means of evaluating this finding if the
patient cannot receive intravenous contrast.
5. RIGHT inguinal hernia.
6. Aortic atherosclerosis.

Aortic Atherosclerosis ([AA]-[AA]).

## 2020-09-12 IMAGING — MR MR HEAD WO/W CM
18 series · 44 of 48 positions shown · IV contrast (gadavist)
Comparison: Prior CT from earlier the same day.

CLINICAL DATA: Initial evaluation for brain mass or lesion.
Previous studies concerning for pulmonary neoplasm with metastatic
disease.

EXAM:
MRI HEAD WITHOUT AND WITH CONTRAST
TECHNIQUE: Multiplanar, multiecho pulse sequences of the brain and surrounding
structures were obtained without and with intravenous contrast.
CONTRAST:  9mL GADAVIST GADOBUTROL 1 MMOL/ML IV SOLN

[Series 5: ax dwi_tracew · axial · 3.0mm · 0.57mm/px · z∈[-124,+29]mm · 2 of 48 slices shown (1 of 2)]
[im 1/48]
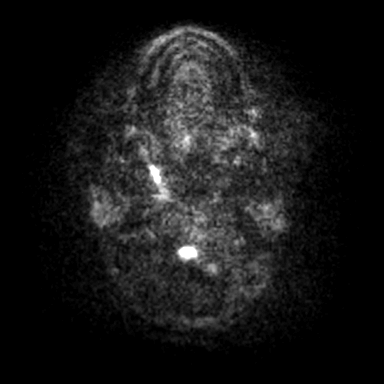
[im 48/48]
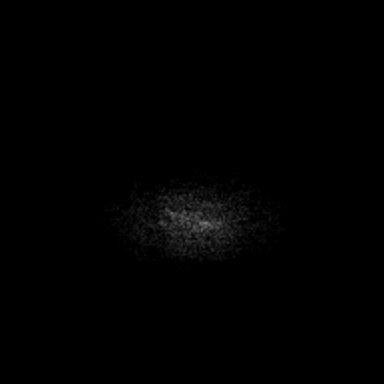

[Series 6: ax dwi_adc · axial · 3.0mm · 0.57mm/px · z∈[-124,+29]mm · 3 of 48 slices shown (1 of 2)]
[im 1/48]
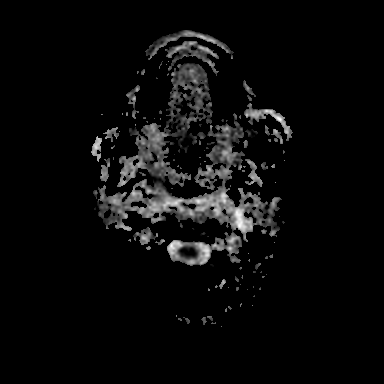
[im 24/48]
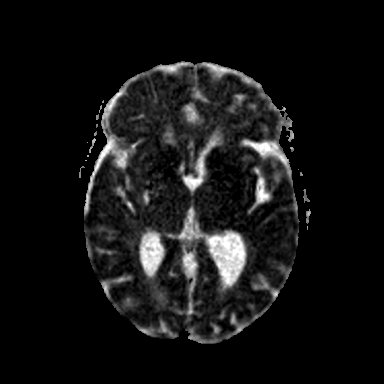
[im 48/48]
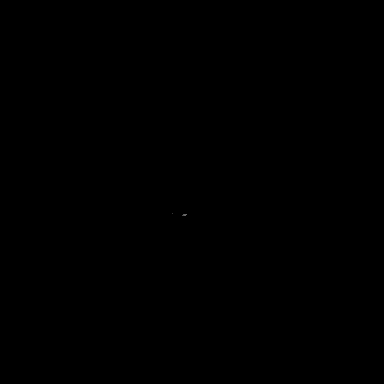

[Series 7: cor dwi_tracew · coronal · 5.0mm · 0.60mm/px · 2 of 36 slices shown]
[im 1/36]
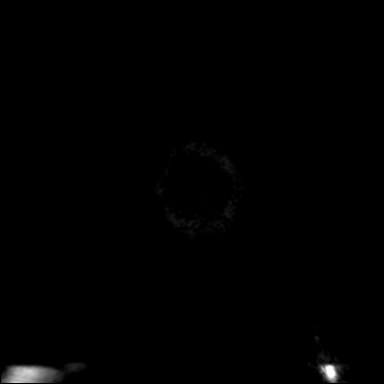
[im 36/36]
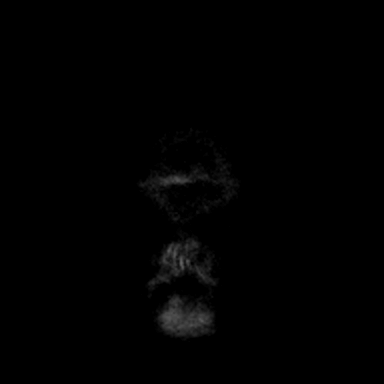

[Series 8: cor dwi_adc · coronal · 5.0mm · 0.60mm/px · 2 of 33 slices shown]
[im 1/33]
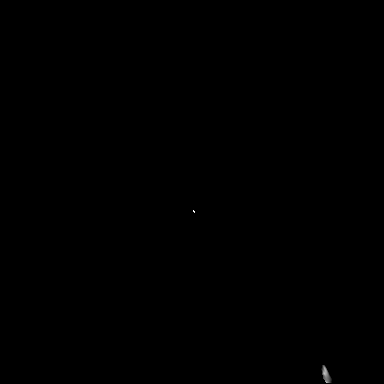
[im 33/33]
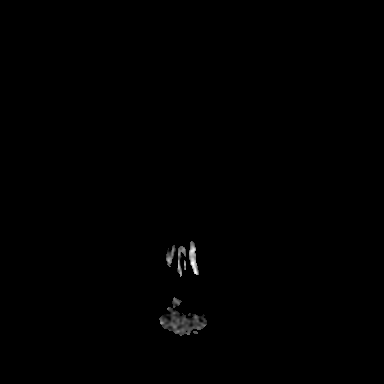

[Series 9: ax dwi_tracew · axial · 3.0mm · 1.25mm/px · z∈[-124,+29]mm · 3 of 48 slices shown (2 of 2)]
[im 1/48]
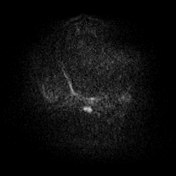
[im 24/48]
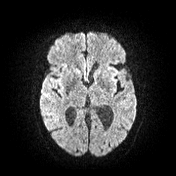
[im 48/48]
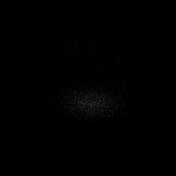

[Series 10: ax dwi_adc · axial · 3.0mm · 1.25mm/px · z∈[-124,+29]mm · 3 of 47 slices shown (2 of 2)]
[im 1/47]
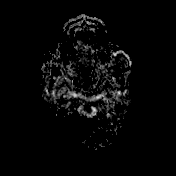
[im 24/47]
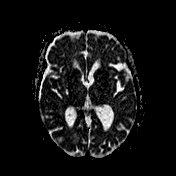
[im 47/47]
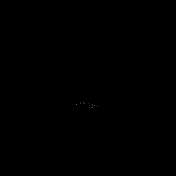

[Series 11: T1 · sagittal · 5.0mm · 0.57mm/px · 1 of 25 slices shown (1 of 3)]
[im 1/25]
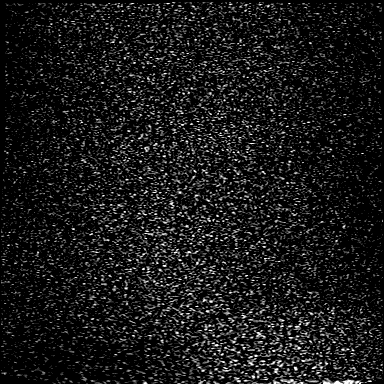

[Series 12: T2 · axial · 5.0mm · 0.45mm/px · z∈[-123,+30]mm · 2 of 27 slices shown (1 of 2)]
[im 1/27]
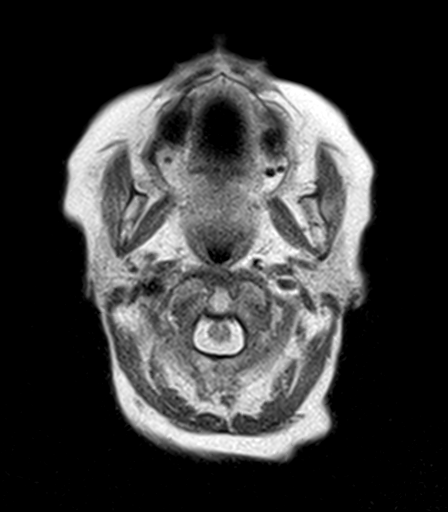
[im 27/27]
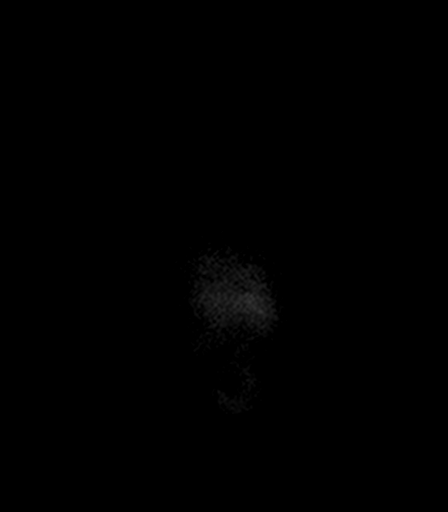

[Series 14: pha_images · axial · 3.0mm · 0.90mm/px · z∈[-130,+41]mm · 3 of 58 slices shown]
[im 1/58]
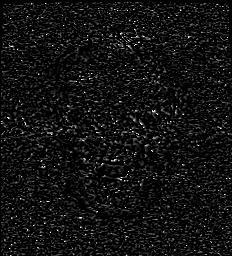
[im 29/58]
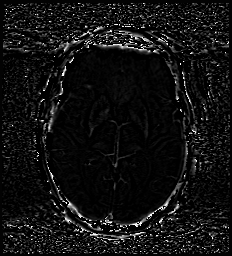
[im 58/58]
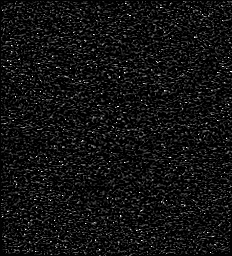

[Series 15: swi_images · axial · 3.0mm · 0.90mm/px · 1 of 60 slices shown]
[im 1/60]
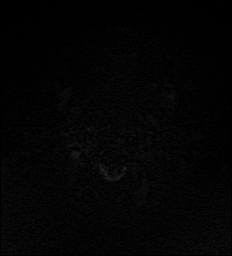

[Series 17: FLAIR · axial · 3.0mm · 0.53mm/px · z∈[-127,+32]mm · 3 of 55 slices shown]
[im 1/55]
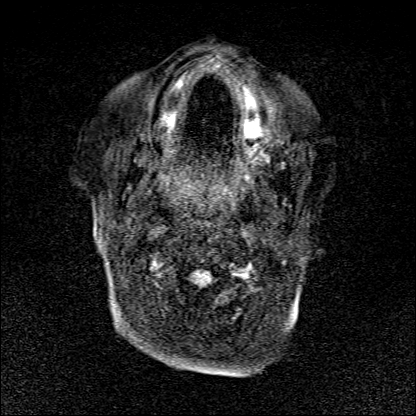
[im 28/55]
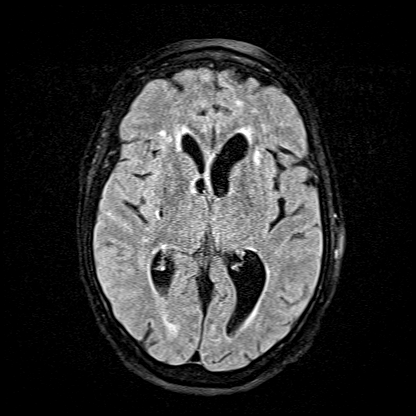
[im 55/55]
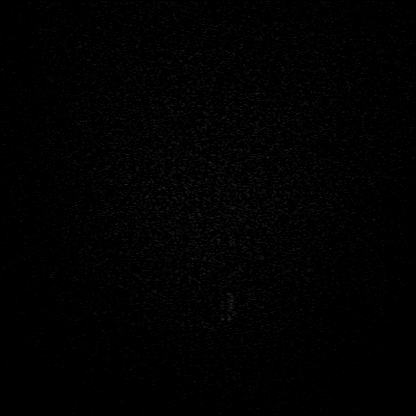

[Series 18: T1 · axial · 1.0mm · 0.98mm/px · z∈[-131,+41]mm · 8 of 176 slices shown (2 of 3)]
[im 1/176]
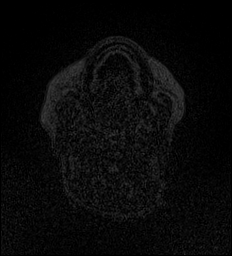
[im 20/176]
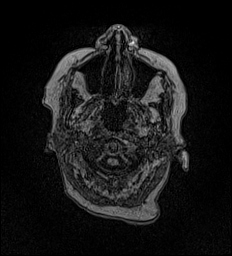
[im 59/176]
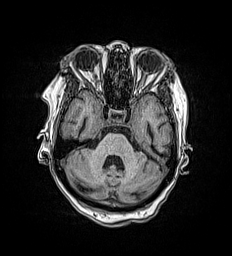
[im 78/176]
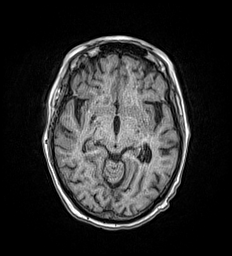
[im 98/176]
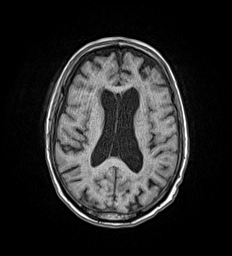
[im 117/176]
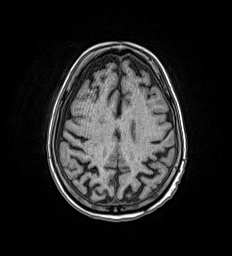
[im 156/176]
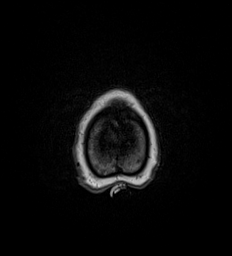
[im 176/176]
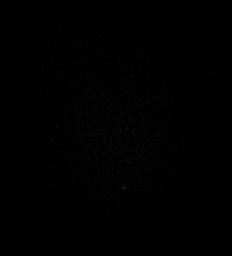

[Series 19: T2 · coronal · 5.0mm · 0.45mm/px · 2 of 31 slices shown (2 of 2)]
[im 1/31]
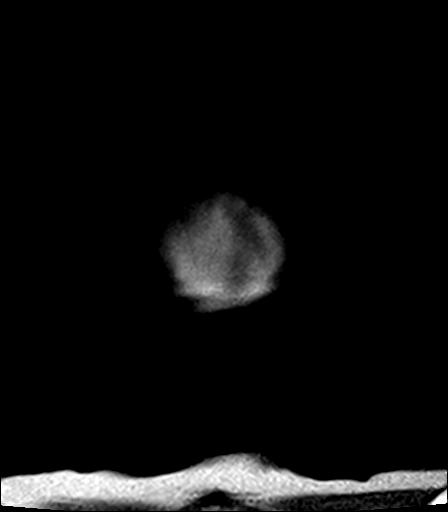
[im 31/31]
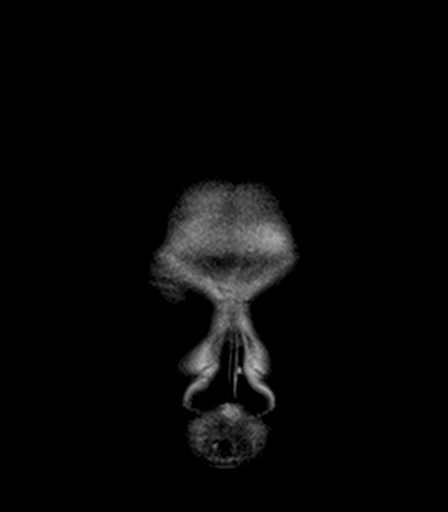

[Series 20: T1 · axial · 5.0mm · 0.90mm/px · z∈[-123,+30]mm · 2 of 27 slices shown (3 of 3)]
[im 1/27]
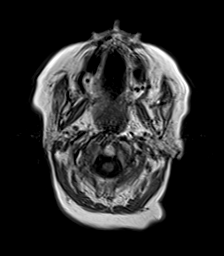
[im 27/27]
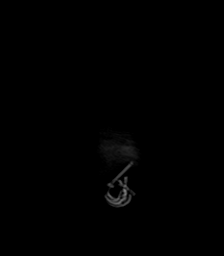

[Series 48: T1 post-contrast · axial · 5.0mm · 0.90mm/px · z∈[-123,+30]mm · 2 of 27 slices shown (1 of 4)]
[im 1/27]
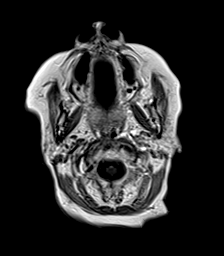
[im 27/27]
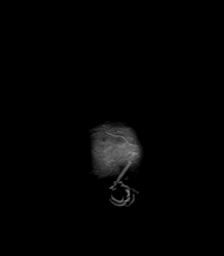

[Series 49: T1 post-contrast · coronal · 5.0mm · 0.57mm/px · 2 of 29 slices shown (2 of 4)]
[im 1/29]
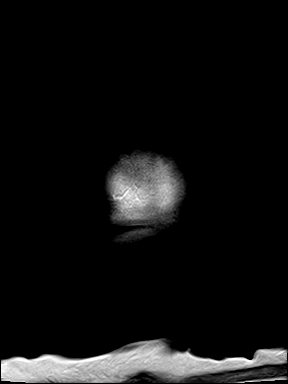
[im 29/29]
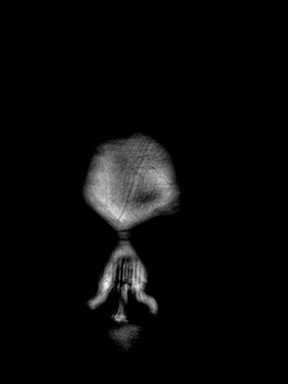

[Series 50: T1 post-contrast · coronal · 5.0mm · 0.90mm/px · 2 of 29 slices shown (3 of 4)]
[im 1/29]
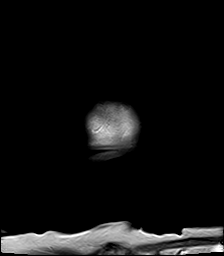
[im 29/29]
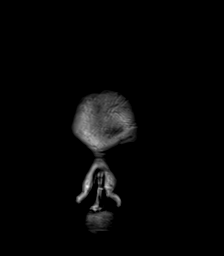

[Series 51: T1 post-contrast · sagittal · 5.0mm · 0.57mm/px · 1 of 24 slices shown (4 of 4)]
[im 1/24]
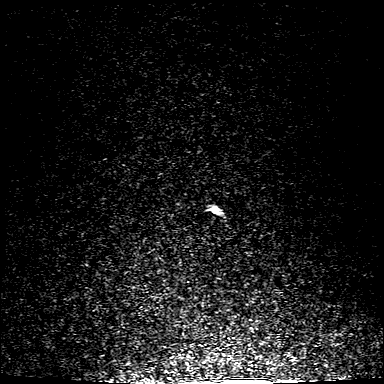

[44 of 48 positions shown; findings below may reference images not displayed]

FINDINGS: Brain: Examination mildly degraded by motion artifact.

Generalized age-related cerebral atrophy. Patchy T2/FLAIR
hyperintensity involving the periventricular, deep, and subcortical
white matter both cerebral hemispheres most consistent with chronic
small vessel ischemic disease, moderate nature. Superimposed remote
lacunar infarct at the right lentiform nucleus.

No foci of diffusion abnormality to suggest acute or subacute
ischemia. Gray-white matter differentiation maintained. No
encephalomalacia to suggest chronic cortical infarction. No acute
intracranial hemorrhage. Single subcentimeter focus of
susceptibility artifact at the anterior right frontal corona radiata
noted (series 15, image 31), likely a small chronic microhemorrhage.
No acute blood products seen at this location on prior CT.

No mass lesion. No hydrocephalus. Small T2 hyperintense extra-axial
collections seen overlying the bilateral cerebral convexities,
consistent with hygromas. These measure up to 6 mm on the right and
2 mm on the left. Relatively mild mass effect on the subjacent right
cerebral hemisphere without significant midline shift.

Pituitary gland and suprasellar region normal. Midline structures
intact.

No abnormal enhancement.

Vascular: Major intracranial vascular flow voids are grossly
maintained at the skull base.

Skull and upper cervical spine: Craniocervical junction within
normal limits. Bone marrow signal intensity normal. No visible focal
molar placing lesion. No scalp soft tissue abnormality.

Sinuses/Orbits: Patient status post bilateral ocular lens
replacement. Globes and orbital soft tissues demonstrate no acute
finding. Paranasal sinuses are largely clear. No mastoid effusion.
Inner ear structures grossly normal.

Other: None.
IMPRESSION: 1. No acute intracranial abnormality. No evidence for intracranial
metastatic disease.
2. Small bilateral subdural hygromas measuring up to 6 mm on the
right and 2 mm on the left. Relatively mild mass effect on the
subjacent right cerebral hemisphere without significant midline
shift.
3. Underlying age-related cerebral atrophy with moderate chronic
microvascular ischemic disease. Small remote lacunar infarct at the
right lentiform nucleus.

## 2020-09-12 IMAGING — MR MR LUMBAR SPINE WO/W CM
6 of 7 series · 30 of 48 positions shown · IV contrast (gadavist)
Comparison: Prior CT from earlier the same day.

CLINICAL DATA: Initial evaluation for metastatic lung carcinoma,
back pain, recent fall.

EXAM:
MRI LUMBAR SPINE WITHOUT AND WITH CONTRAST
TECHNIQUE: Multiplanar and multiecho pulse sequences of the lumbar spine were
obtained without and with intravenous contrast.
CONTRAST:  9mL GADAVIST GADOBUTROL 1 MMOL/ML IV SOLN

[Series 43: T2 · sagittal · 4.0mm · 0.81mm/px · 4 of 17 slices shown (1 of 2)]
[im 1/17]
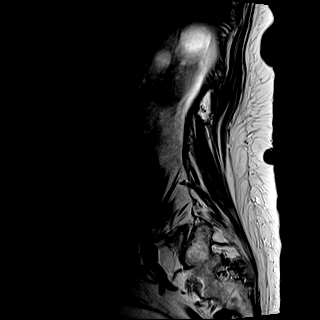
[im 6/17]
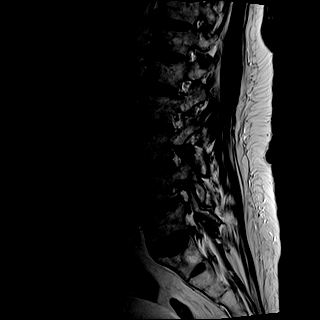
[im 11/17]
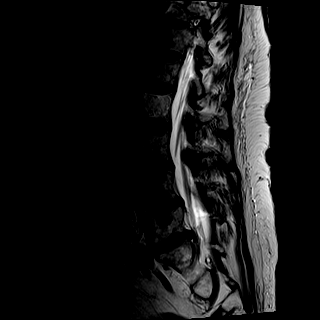
[im 17/17]
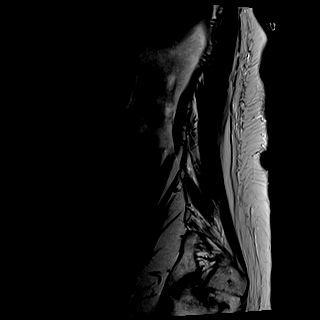

[Series 44: T1 · sagittal · 4.0mm · 0.81mm/px · 4 of 17 slices shown (1 of 2)]
[im 1/17]
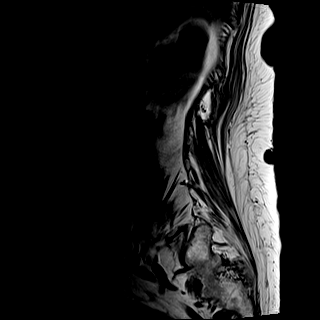
[im 6/17]
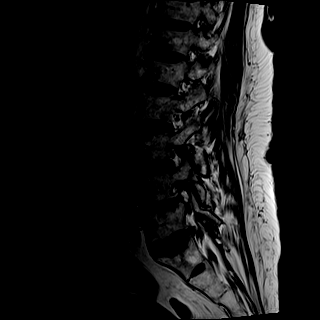
[im 11/17]
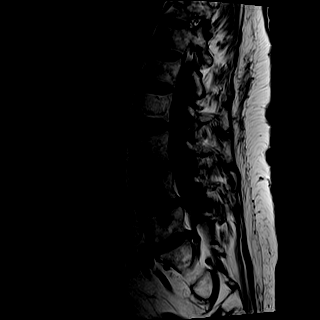
[im 17/17]
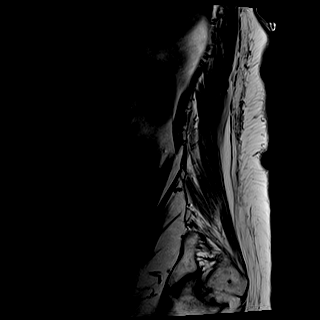

[Series 45: STIR · sagittal · 4.0mm · 0.41mm/px · 1 of 17 slices shown]
[im 1/17]
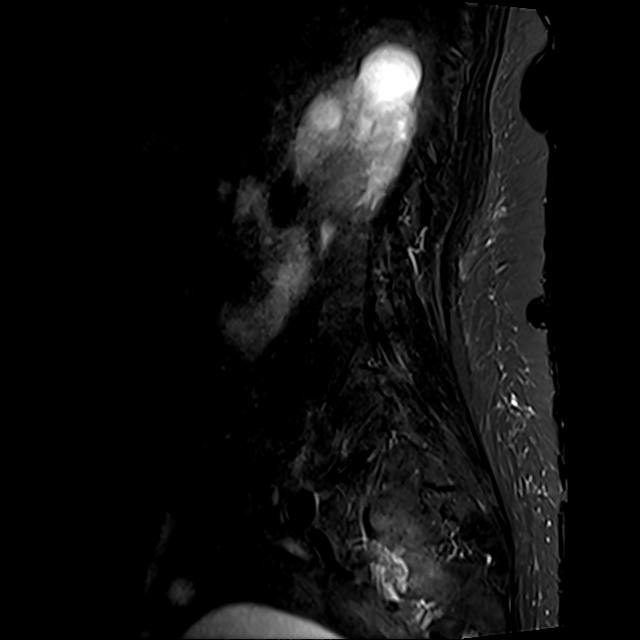

[Series 46: T2 · axial · 4.0mm · 0.78mm/px · z∈[-649,-456]mm · 8 of 36 slices shown (2 of 2)]
[im 1/36]
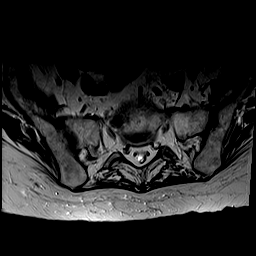
[im 4/36]
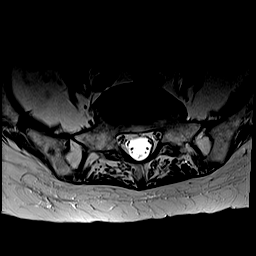
[im 12/36]
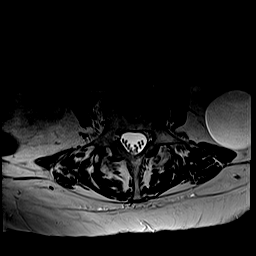
[im 16/36]
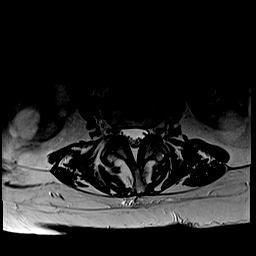
[im 20/36]
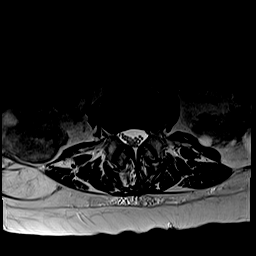
[im 24/36]
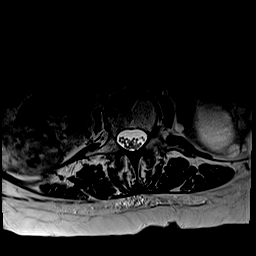
[im 32/36]
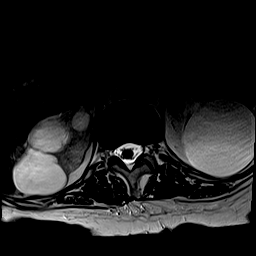
[im 36/36]
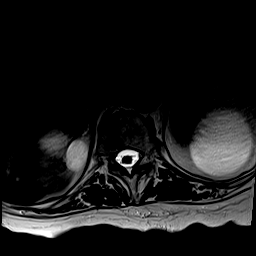

[Series 47: T1 · axial · 4.0mm · 0.39mm/px · z∈[-649,-456]mm · 8 of 36 slices shown (2 of 2)]
[im 1/36]
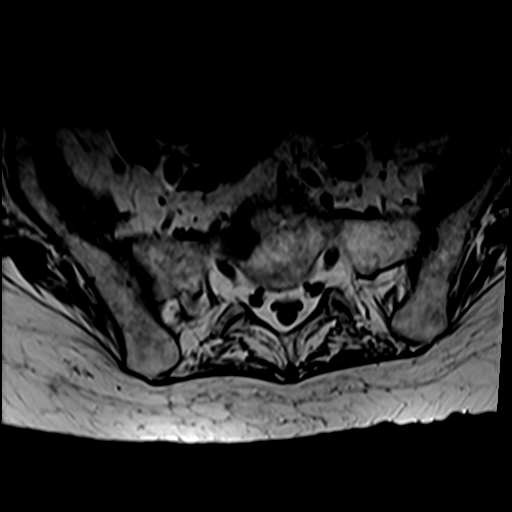
[im 4/36]
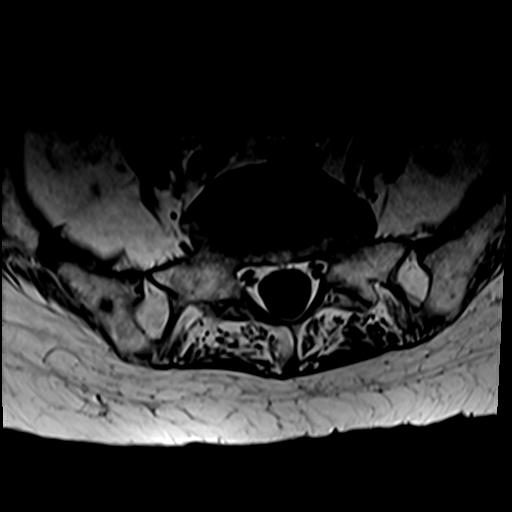
[im 12/36]
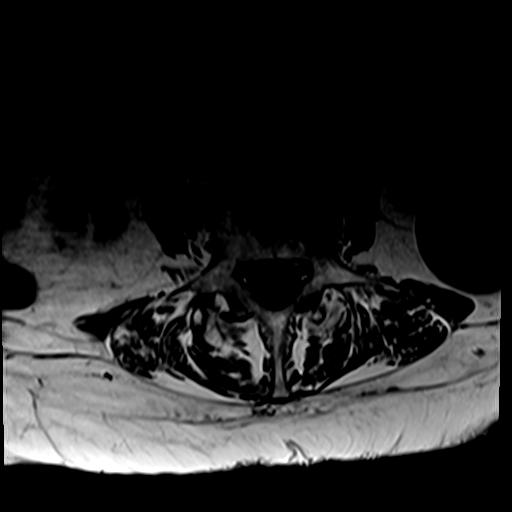
[im 16/36]
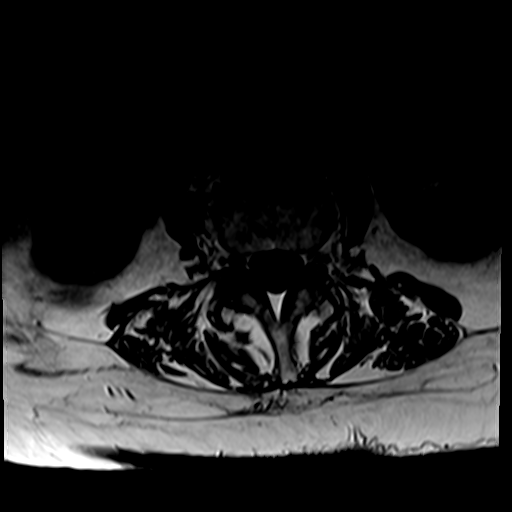
[im 20/36]
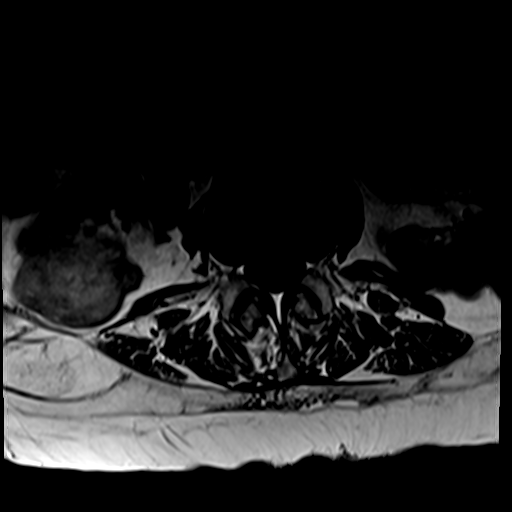
[im 24/36]
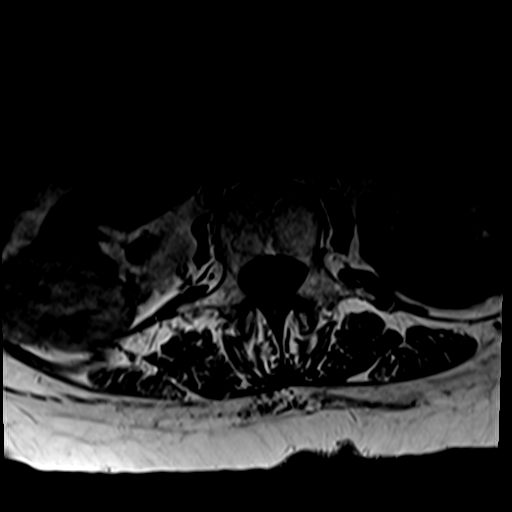
[im 32/36]
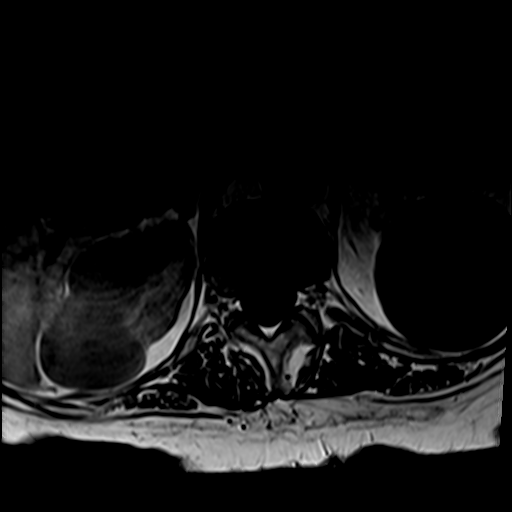
[im 36/36]
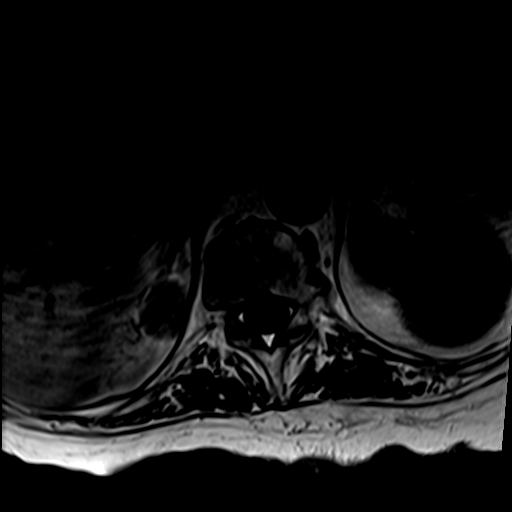

[Series 54: T1 fat-sat post-contrast · sagittal · 4.0mm · 0.81mm/px · 5 of 17 slices shown]
[im 1/17]
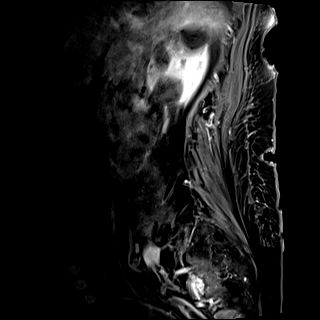
[im 5/17]
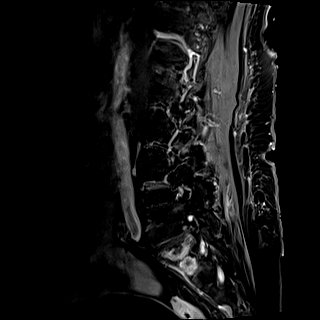
[im 9/17]
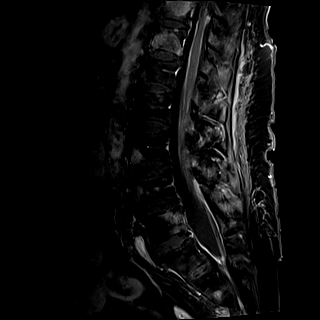
[im 13/17]
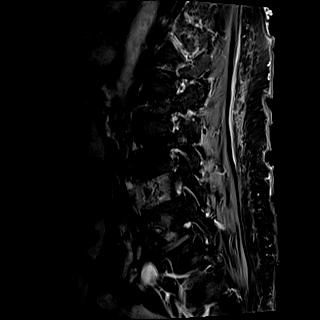
[im 17/17]
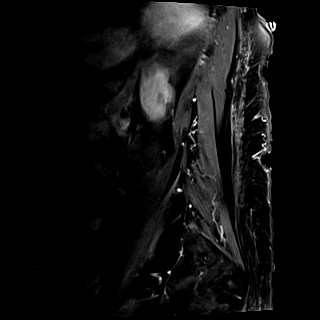

[30 of 48 positions shown; findings below may reference images not displayed]

FINDINGS: Segmentation: Standard. Lowest well-formed disc space labeled the
L5-S1 level.

Alignment: Trace anterolisthesis of L3 on L4, with 4 mm
anterolisthesis of L4 on L5, and 3 mm anterolisthesis of L5 on S1.
Findings chronic and facet mediated.

Vertebrae: Multiple scattered osseous metastases seen throughout the
lumbar spine as well as the visualized sacrum and pelvis.
Involvement of essentially all levels. For reference purposes, the
largest lesion is positioned at L4 and measures approximately
cm. No significant extra osseous or epidural extension is seen. No
visible associated pathologic fracture or other complication.

Conus medullaris and cauda equina: Conus extends to the L1-2 level.
Conus and cauda equina appear normal.

Paraspinal and other soft tissues: Paraspinous soft tissues
demonstrate no acute finding. Innumerable scattered cysts seen about
the visualized kidneys, several of which are mildly complex in
appearance. Additionally, there is a markedly heterogeneous
enhancing solid-appearing mass arising from the interpolar right
kidney measuring approximately 5.8 cm (series 55, image 11),
concerning for a primary renal cell neoplasm. Few additional
subcentimeter cystic lesions noted within the partially visualized
liver, not well evaluated on this exam.

Disc levels:

L1-2: Negative interspace. Mild bilateral facet hypertrophy. No
canal or foraminal stenosis.

L2-3: Negative interspace. Mild to moderate facet hypertrophy. No
canal or foraminal stenosis.

L3-4: Trace anterolisthesis. Mild disc bulge. Moderate facet
hypertrophy. No spinal stenosis. Mild bilateral L3 foraminal
narrowing.

L4-5: 4 mm anterolisthesis. Mild disc bulge with disc desiccation.
Moderate facet hypertrophy. No spinal stenosis. Mild right greater
than left L4 foraminal narrowing.

L5-S1: Trace anterolisthesis. Disc bulge with disc desiccation and
reactive endplate spurring. Mild facet hypertrophy. No spinal
stenosis. Mild bilateral L5 foraminal narrowing.
IMPRESSION: 1. Widespread osseous metastatic disease throughout the lumbar spine
and visualized pelvis. No significant extra osseous or epidural
involvement. No pathologic fracture or other complication.
2. 5.8 cm mass arising from the interpolar right kidney, concerning
for an additional possible primary renal cell neoplasm. Further
evaluation with dedicated renal mass protocol CT and/or MRI
recommended for further evaluation.
3. Mild for age degenerative spondylosis and facet hypertrophy
throughout the lumbar spine without significant spinal stenosis.
Associated mild bilateral L3 through L5 foraminal narrowing.

## 2020-09-12 IMAGING — CT CT CERVICAL SPINE W/O CM
3 of 4 series · 12 of 33 positions shown, 14 images · non-contrast
Comparison: None.

CLINICAL DATA: Neck trauma (Age >= 65y)

EXAM:
CT CERVICAL SPINE WITHOUT CONTRAST
TECHNIQUE: Multidetector CT imaging of the cervical spine was performed without
intravenous contrast. Multiplanar CT image reconstructions were also
generated.

[Series 4: sagittal bone · sagittal · 0.25mm/px · 5 of 51 slices shown, 6 images]
[im 17/51  bone]
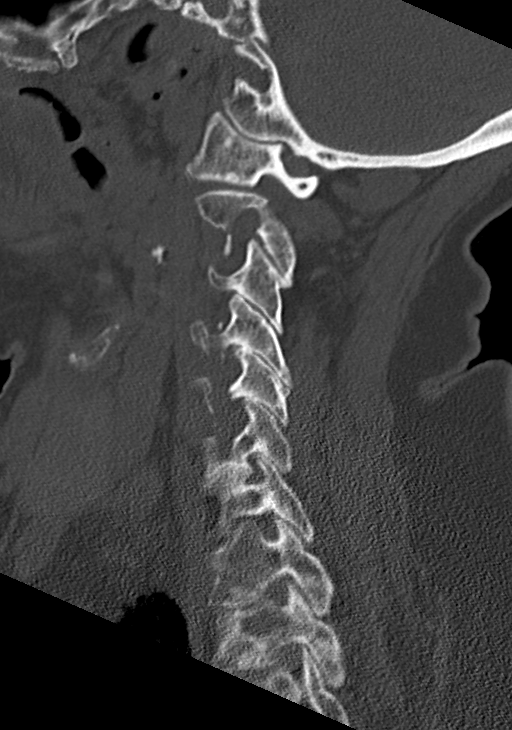
[im 21/51  bone]
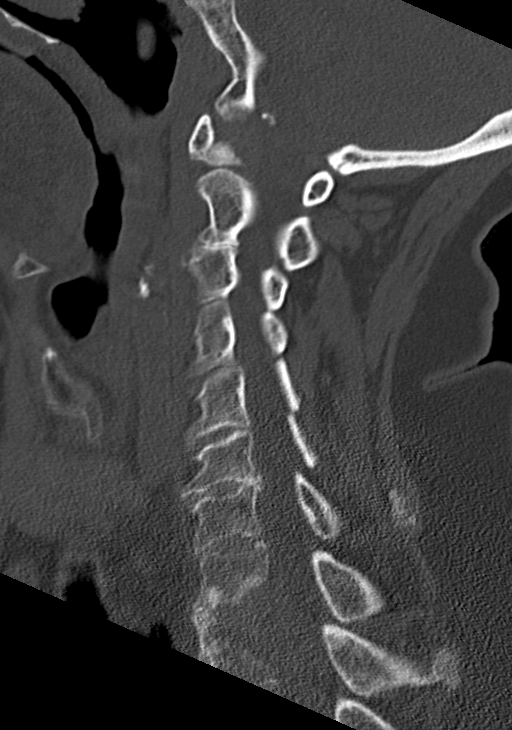
[im 26/51  soft-tissue]
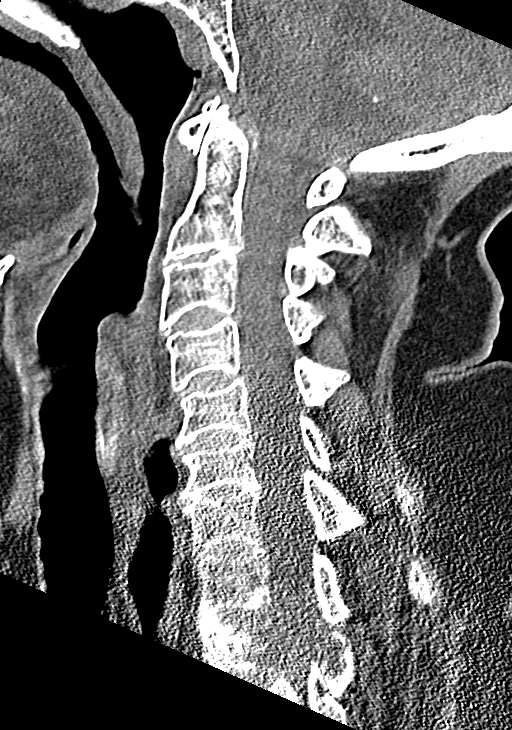
[im 26/51  bone]
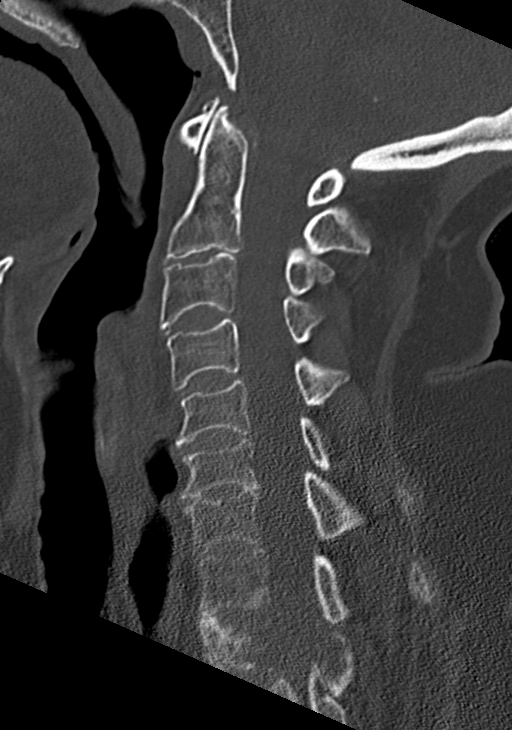
[im 30/51  bone]
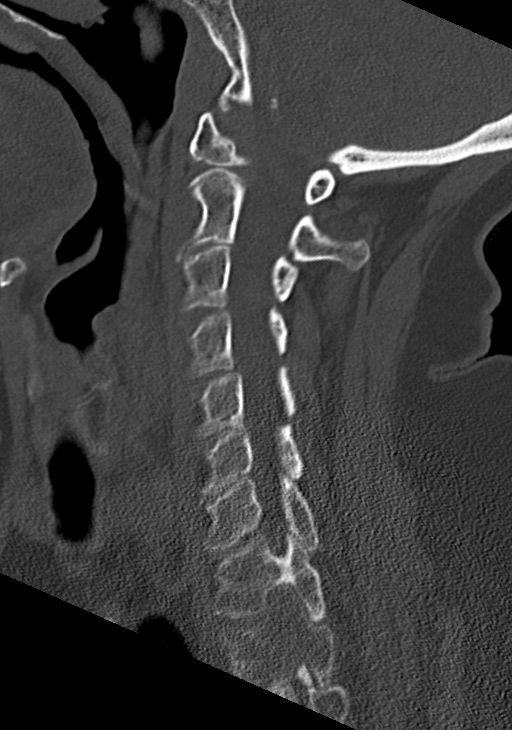
[im 34/51  bone]
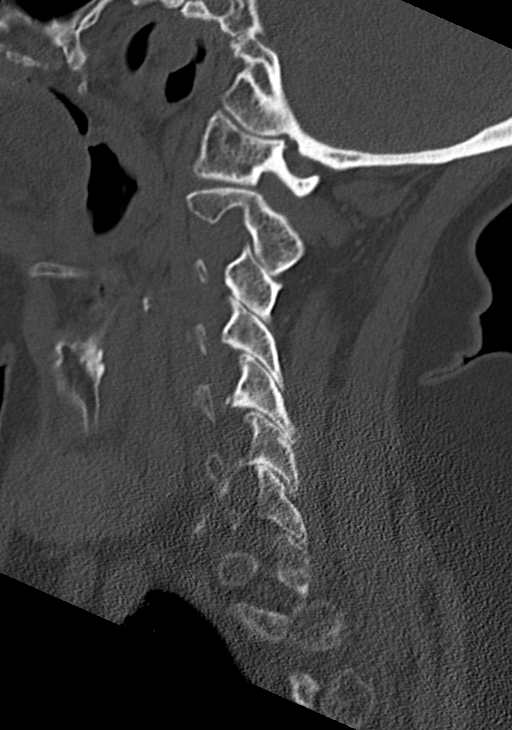

[Series 5: coronal bone · coronal · 0.25mm/px · 3 of 43 slices shown]
[im 9/43  bone]
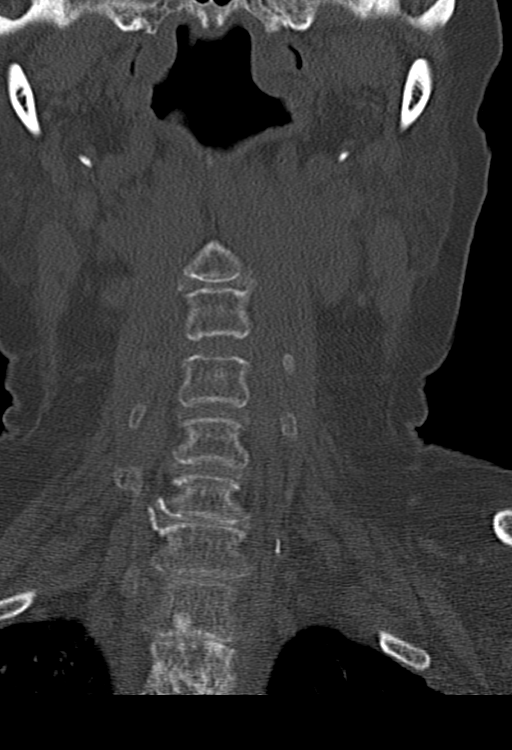
[im 17/43  bone]
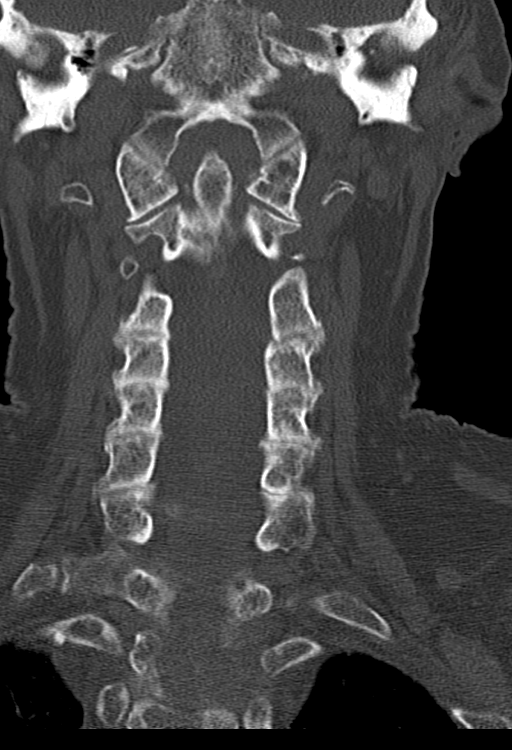
[im 26/43  bone]
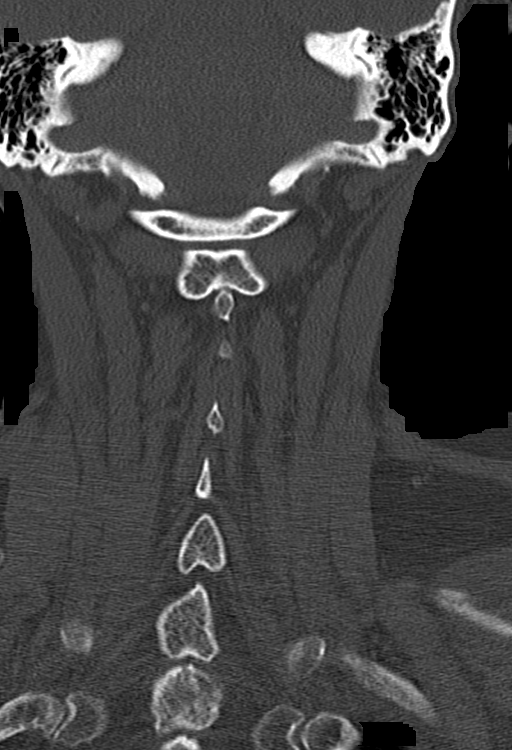

[Series 6: orthogonal bone · axial · 0.23mm/px · z∈[+1163,+1267]mm · 4 of 87 slices shown, 5 images]
[im 15/87  soft-tissue]
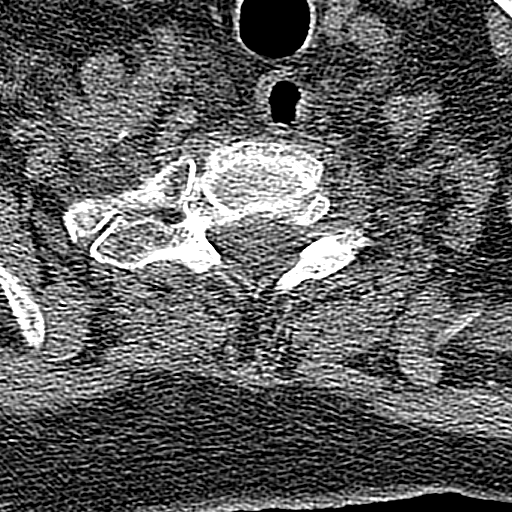
[im 15/87  bone]
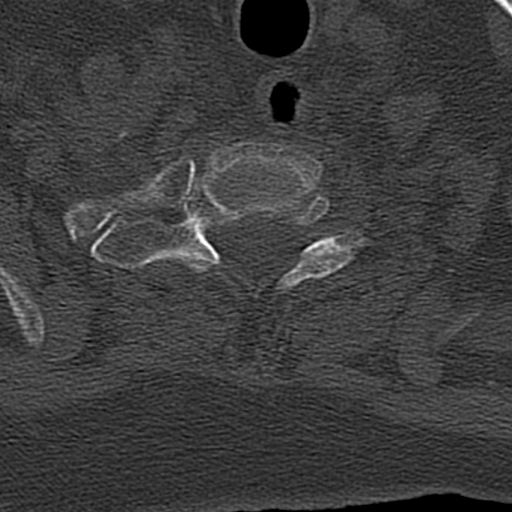
[im 29/87  bone]
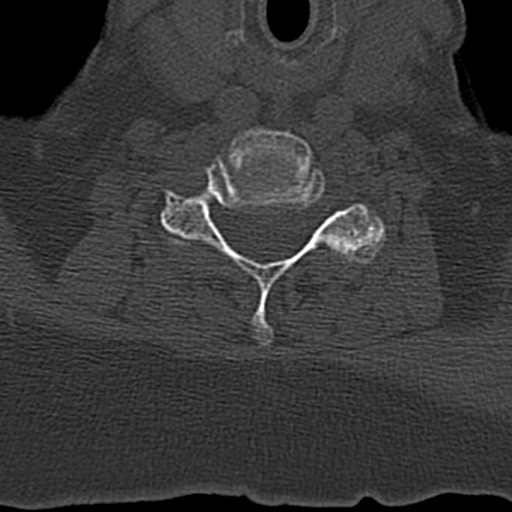
[im 58/87  bone]
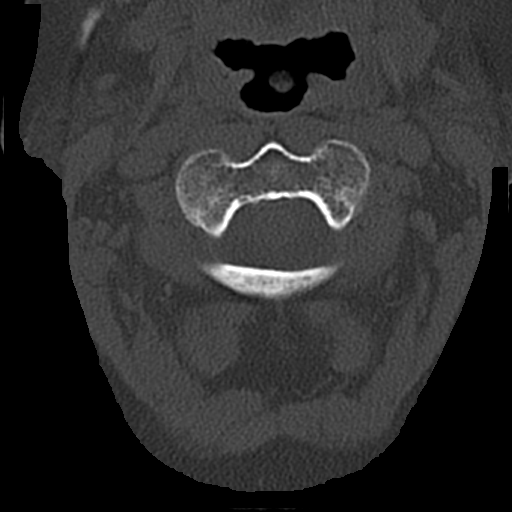
[im 72/87  bone]
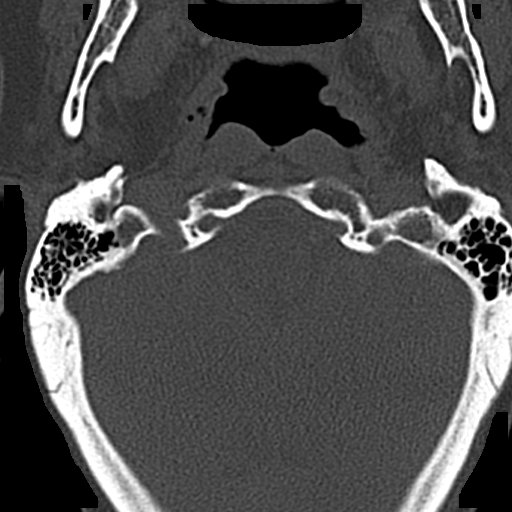

[12 of 33 positions shown; findings below may reference images not displayed]

FINDINGS: Alignment: Straightening of the normal cervical lordosis. No
substantial sagittal subluxation.

Skull base and vertebrae: No convincing evidence of acute fracture
cervical spine. Mild height loss of multiple lower cervical
vertebral bodies, favored to represent degenerative remodeling given
no associated lucency, sclerosis or surrounding paraspinal edema.
Small sclerotic lesions in the posterior elements at C7, suggestive
metastatic given findings in the thoracic spine.

Soft tissues and spinal canal: No prevertebral fluid or swelling. No
visible canal hematoma. See CT of the thoracic spine for evaluation
of canal findings at C2.

Disc levels: Mild-to-moderate multilevel degenerative disc disease
and facet arthropathy.

Upper chest: Evaluated on concurrent CT chest/abdomen/pelvis.

Other: Calcific atherosclerosis of carotid arteries.
IMPRESSION: 1. No convincing evidence of acute fracture or traumatic
malalignment cervical spine.
2. Probable small sclerotic metastases in the posterior elements at
C7. An MRI of the cervical spine with contrast provide more
sensitive evaluation if clinically indicated.
3. Please see CT of thoracic spine for description of extensive
metastatic disease in the thoracic spine, including epidural
extension of tumor.

## 2020-09-12 IMAGING — CR DG CHEST 2V
1 series · 2 of 2 positions shown · non-contrast
Comparison: [20]

CLINICAL DATA: Shortness of breath, history of lung cancer

EXAM:
CHEST - 2 VIEW

[Series 1: dg chest 2 view · 0.14mm/px · 2 of 2 slices shown]
[im 1/2]
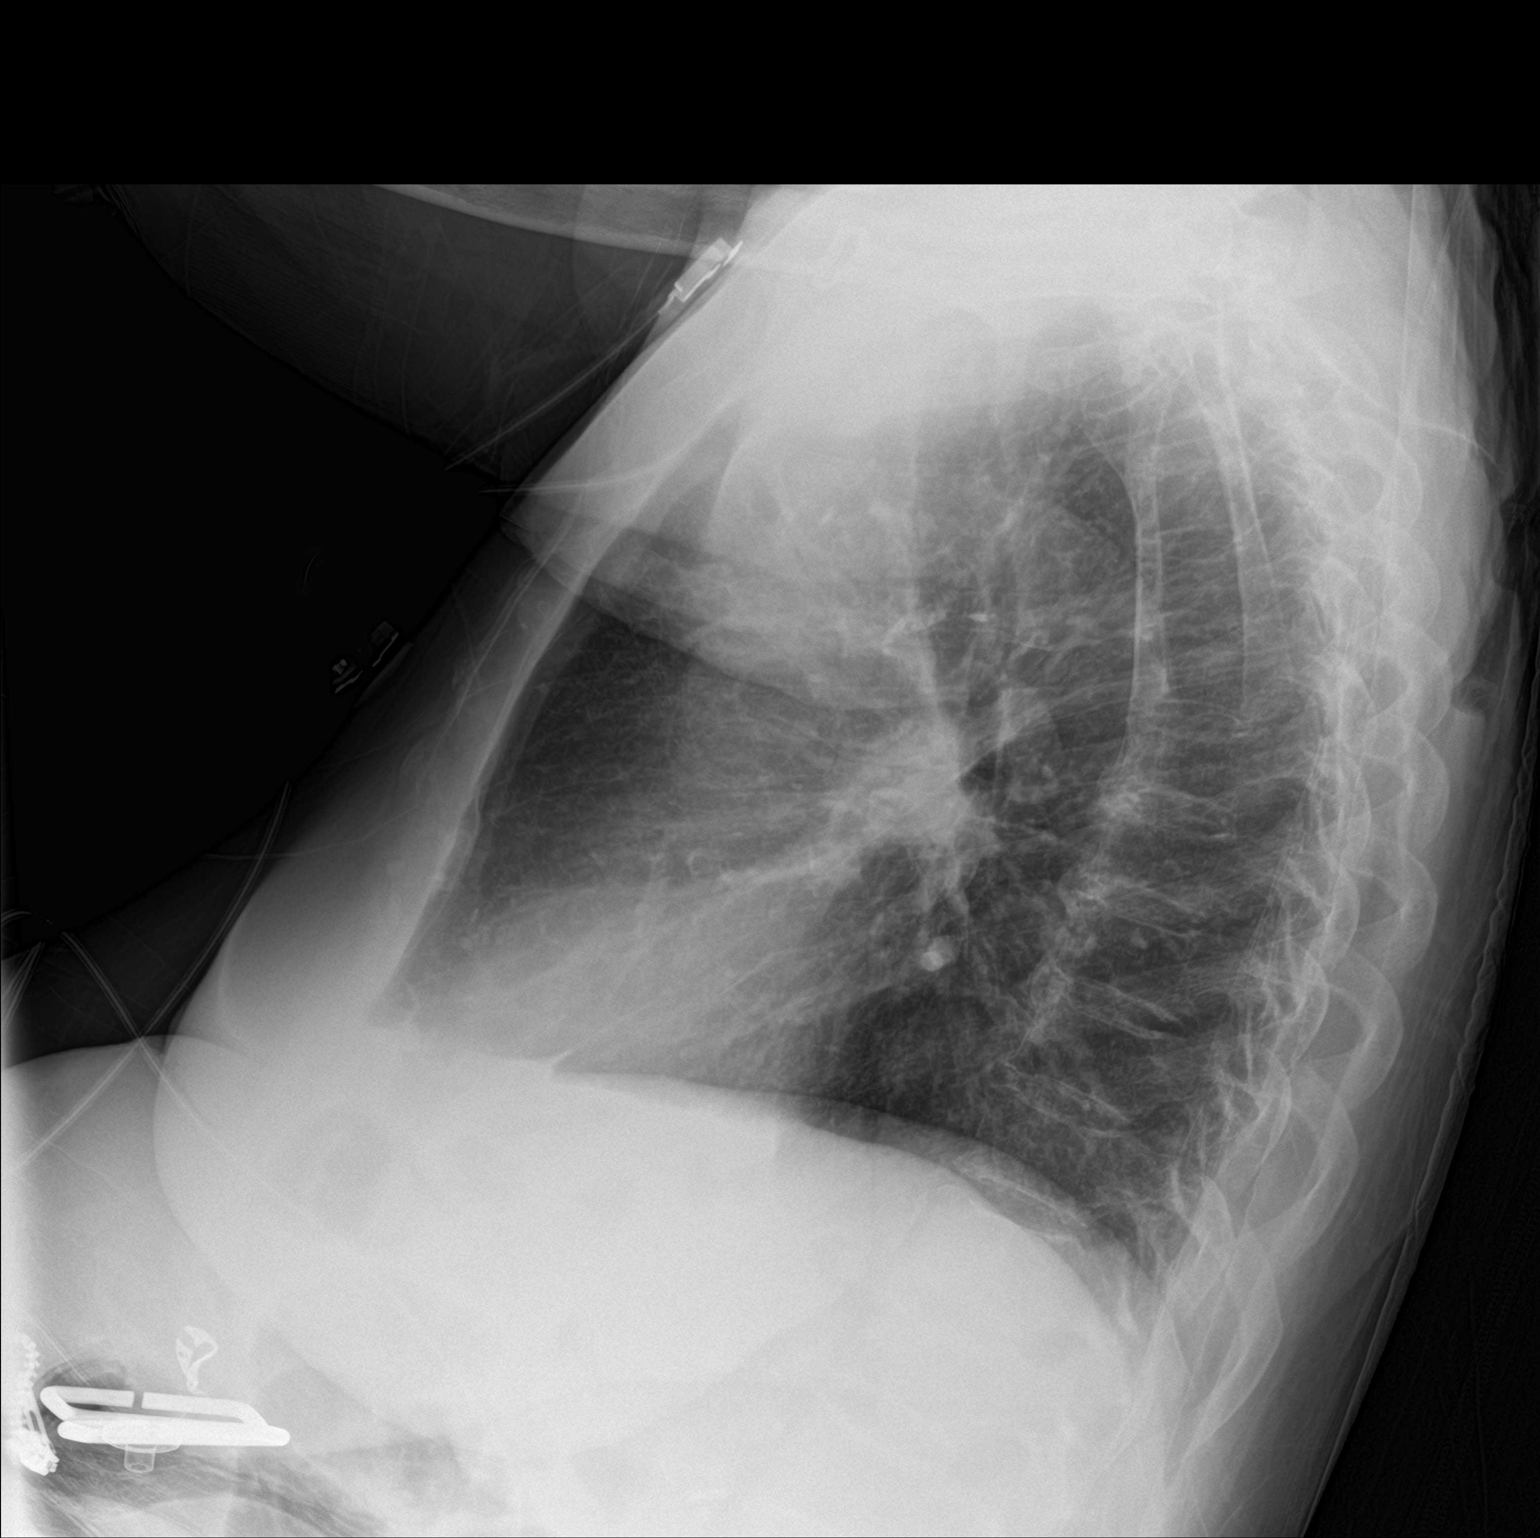
[im 2/2]
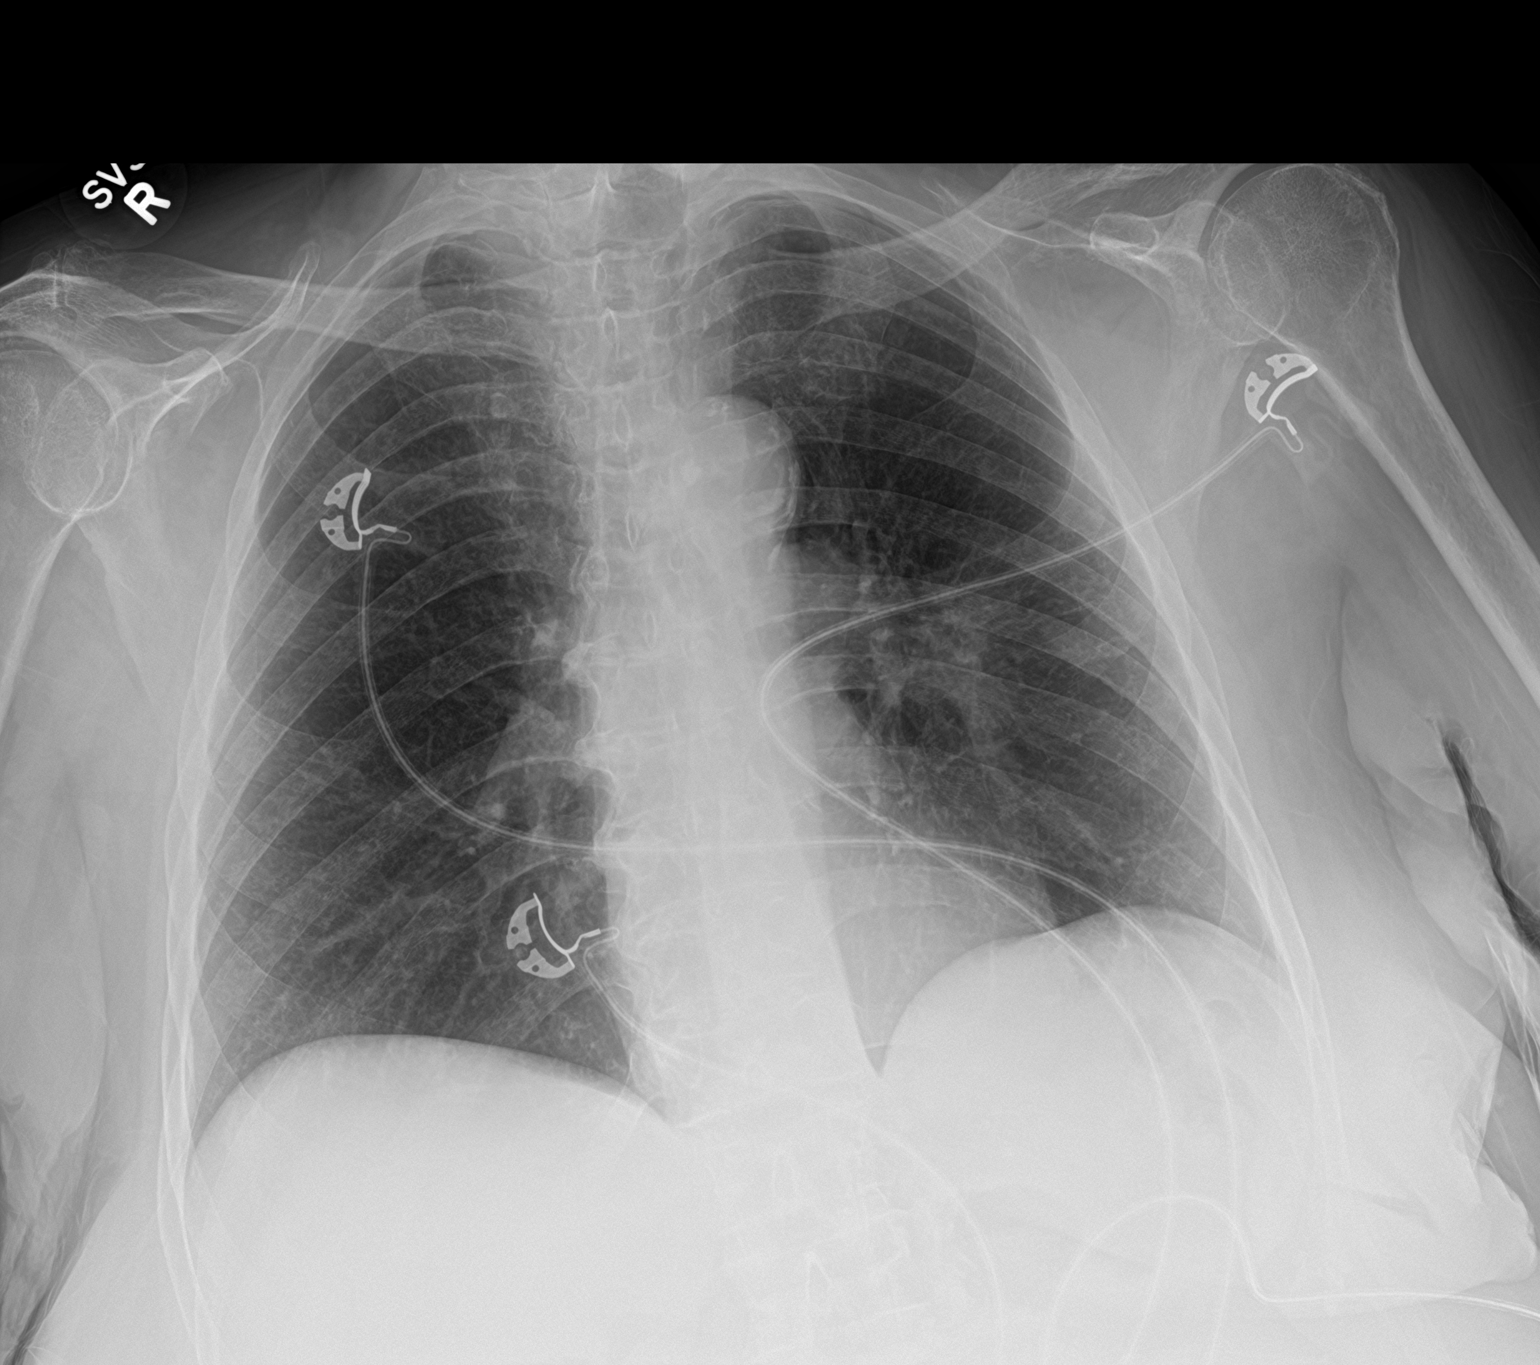

[2 of 2 positions shown; findings below may reference images not displayed]

FINDINGS: Patchy left perihilar opacity. No pleural effusion or pneumothorax.
Cardiomediastinal contours are within normal limits with normal
heart size. Calcified plaque along the thoracic aorta. No acute
osseous abnormality.
IMPRESSION: Patchy left perihilar opacity. May reflect known lung cancer or
pneumonia.

## 2020-09-12 IMAGING — CT CT HEAD W/O CM
3 series · 15 of 47 positions shown, 18 images · non-contrast
Comparison: None.

CLINICAL DATA: Head trauma, minor (Age >= 65y)

EXAM:
CT HEAD WITHOUT CONTRAST
TECHNIQUE: Contiguous axial images were obtained from the base of the skull
through the vertex without intravenous contrast.

[Series 2: head wo · axial · 0.41mm/px · z∈[+1306,+1431]mm · 9 of 30 slices shown, 12 images]
[im 3/30  brain]
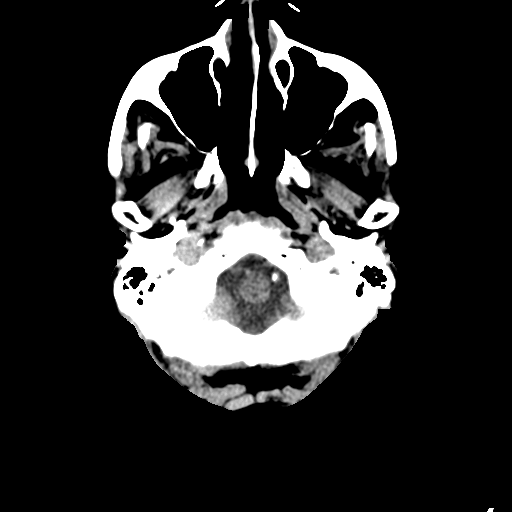
[im 3/30  bone]
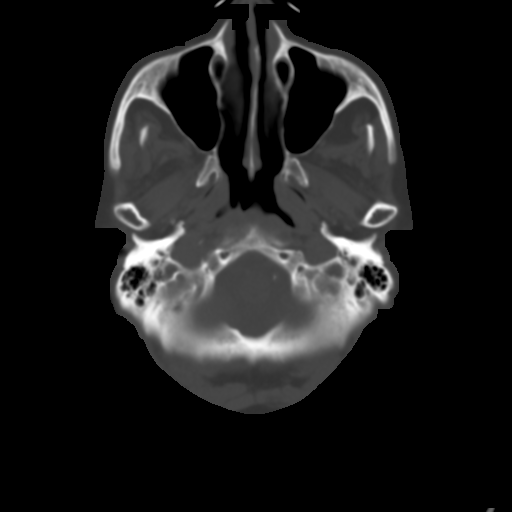
[im 6/30  brain]
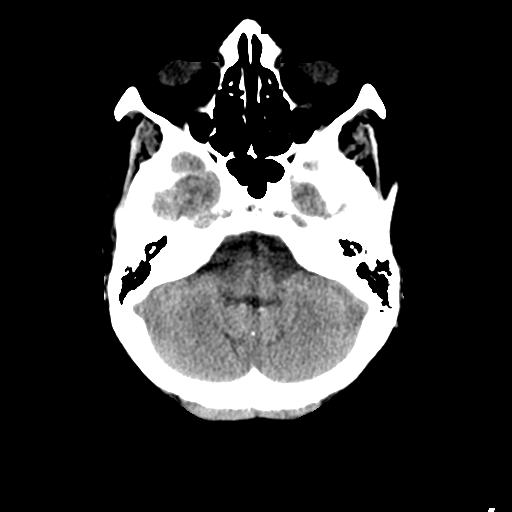
[im 9/30  brain]
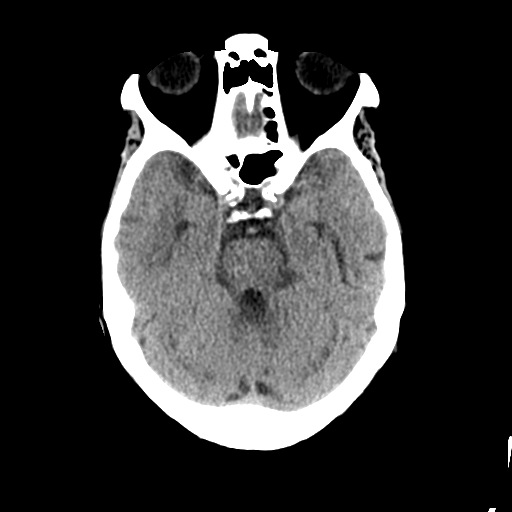
[im 12/30  brain]
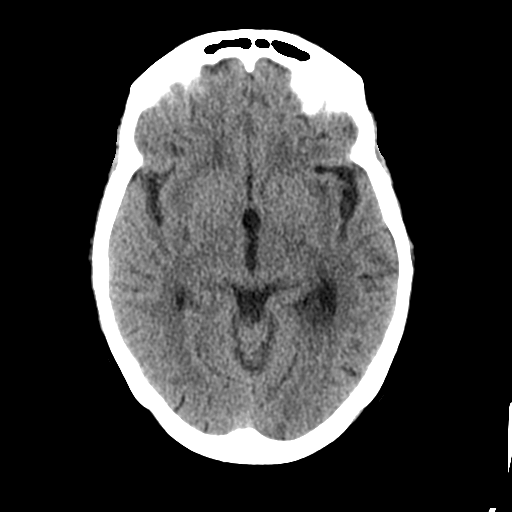
[im 16/30  brain]
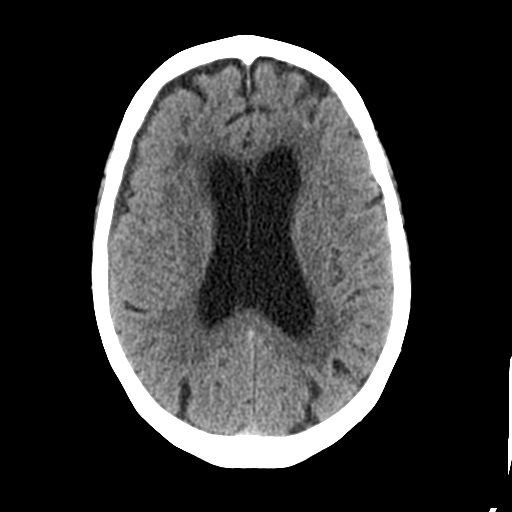
[im 16/30  bone]
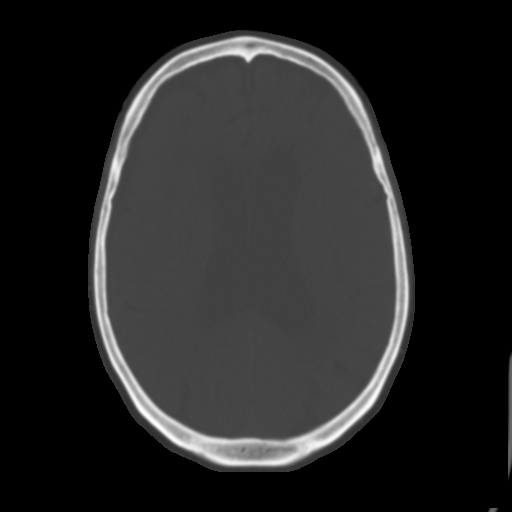
[im 19/30  brain]
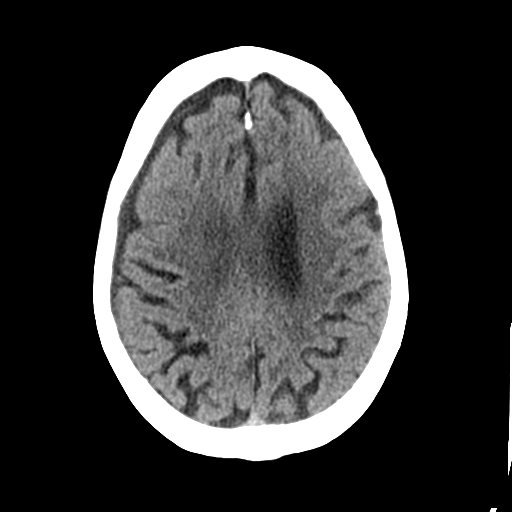
[im 22/30  brain]
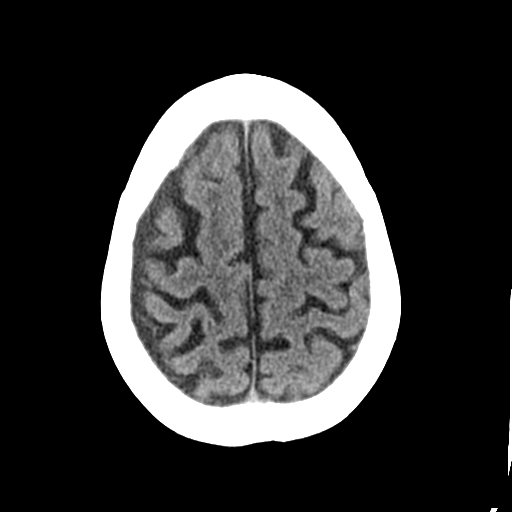
[im 25/30  brain]
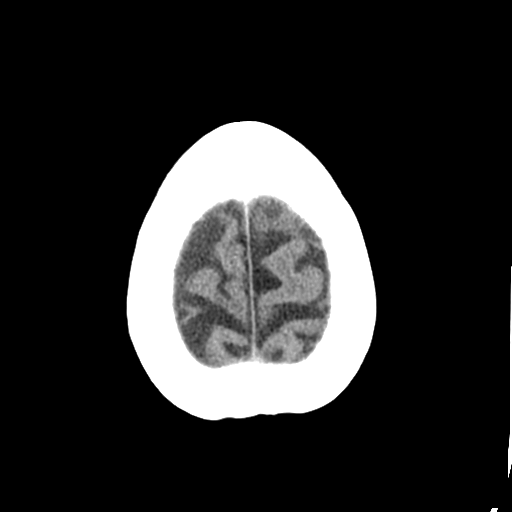
[im 28/30  brain]
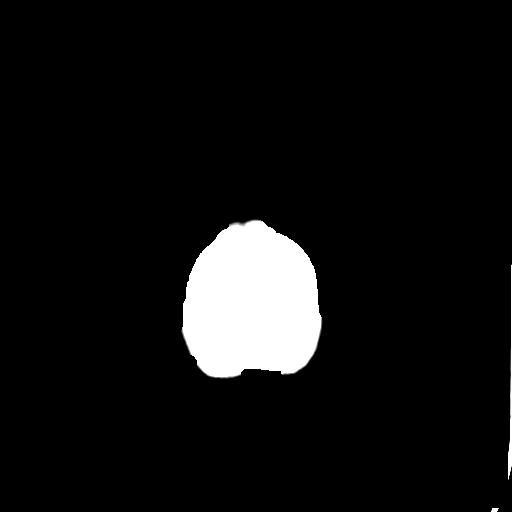
[im 28/30  bone]
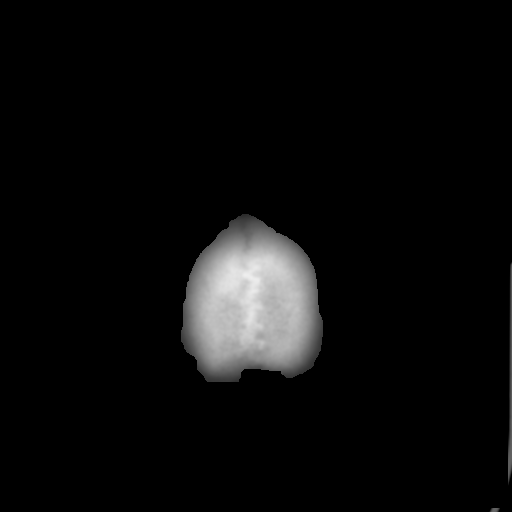

[Series 4: coronal soft tissue · coronal · 0.29mm/px · 3 of 66 slices shown]
[im 22/66  brain]
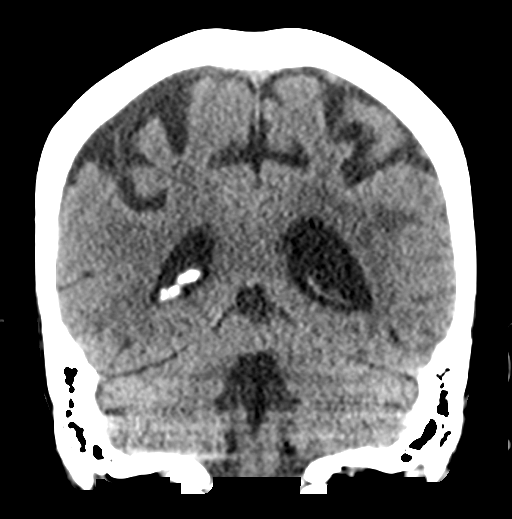
[im 29/66  brain]
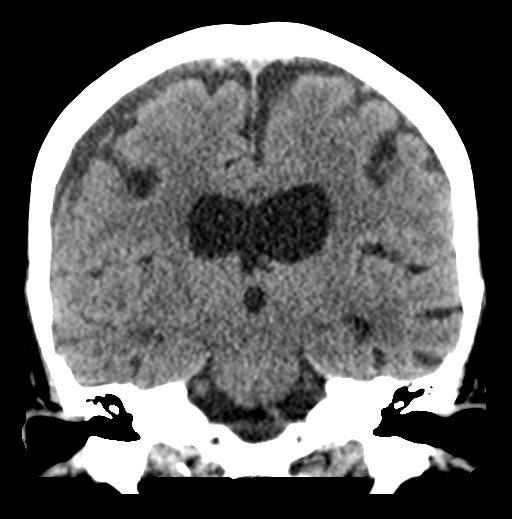
[im 37/66  brain]
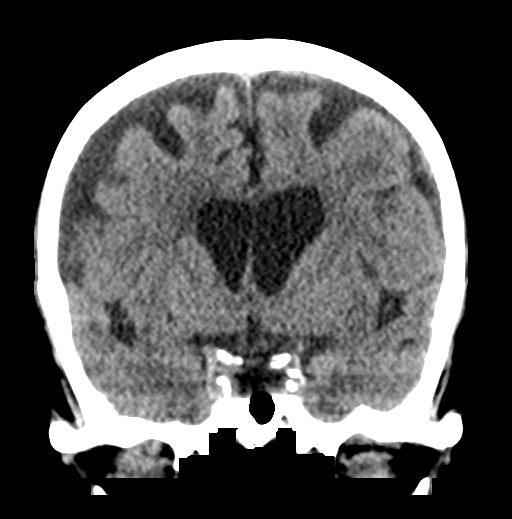

[Series 5: sagittal soft tissue · sagittal · 0.30mm/px · 3 of 51 slices shown]
[im 17/51  brain]
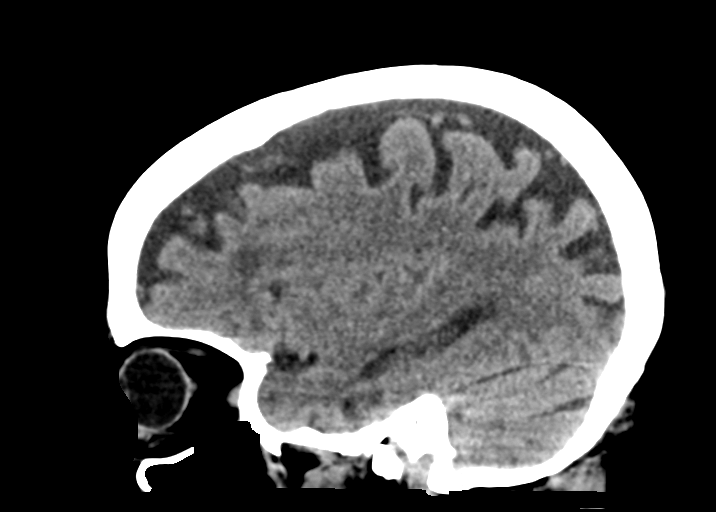
[im 26/51  brain]
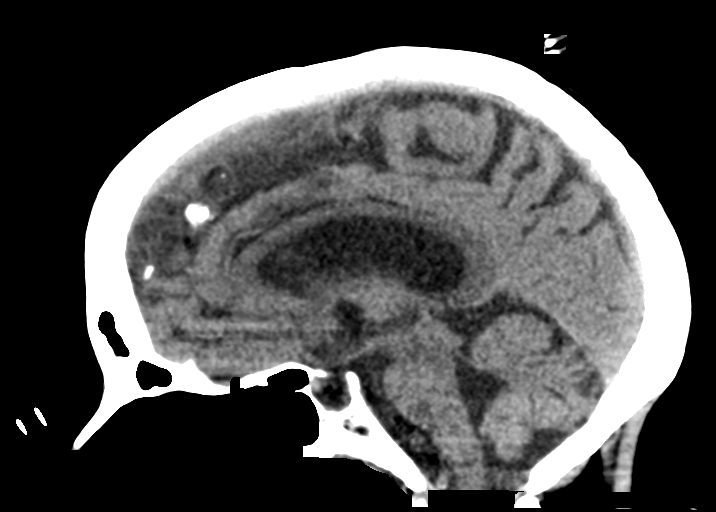
[im 34/51  brain]
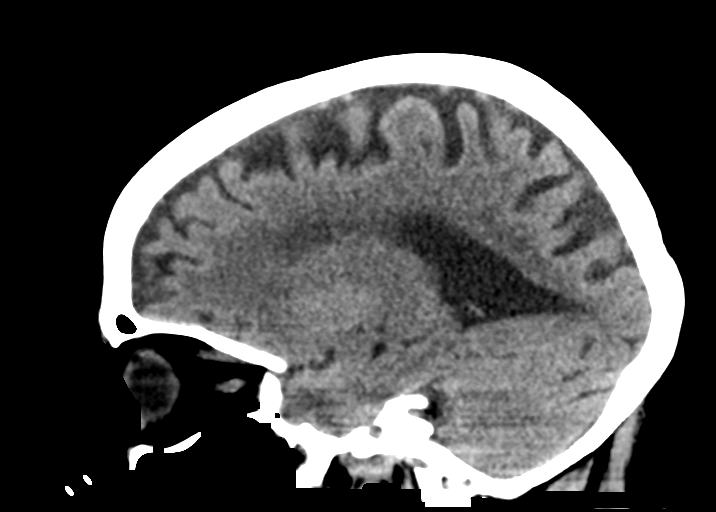

[15 of 47 positions shown; findings below may reference images not displayed]

FINDINGS: Brain: Approximately 6 mm low-density right cerebral convexity
subdural collection. Mild mass effect on the adjacent frontal lobe
without significant midline shift. No definite hyperdense component.
No evidence of acute large vascular territory infarct. Remote
appearing small infarcts the right basal ganglia, extending
overlying corona radiata. Additional patchy white matter
hypoattenuation, likely the sequela of chronic microvascular
ischemic disease. Mild-to-moderate atrophy with ex vacuo ventricular
dilation. No hydrocephalus. No evidence of a mass lesion. Partially
empty and expanded sella.

Vascular: No hyperdense vessel identified. Calcific intracranial
atherosclerosis.

Skull: No acute fracture.

Sinuses/Orbits: Clear visualized sinuses no acute orbital findings.

Other: No mastoid effusions.
IMPRESSION: 1. Approximately 6 mm low-density right cerebral convexity subdural
collection, most likely a chronic subdural hematoma and/or hygroma.
Mild mass effect on the adjacent frontal lobe without significant
midline shift.
2. Small remote appearing infarct in the right basal ganglia, mild
to moderate chronic microvascular ischemic disease and atrophy.
3. An MRI with contrast could provide more sensitive evaluation for
metastatic disease if clinically indicated.

## 2020-09-12 IMAGING — MR MR THORACIC SPINE WO/W CM
6 of 9 series · 25 of 48 positions shown · IV contrast (9ml Gadavist)
Comparison: Prior CT from earlier the same day.
COMPARISON: Prior CT from earlier the same day.

Addendum:
CLINICAL DATA: Initial evaluation for metastatic lung carcinoma.

EXAM:
MRI THORACIC WITHOUT AND WITH CONTRAST
TECHNIQUE: Multiplanar and multiecho pulse sequences of the thoracic spine were
obtained without and with intravenous contrast.
CONTRAST:  9mL GADAVIST GADOBUTROL 1 MMOL/ML IV SOLN

[Series 36: T1 · sagittal · 5.0mm · 1.88mm/px · 1 of 9 slices shown (1 of 3)]
[im 1/9]
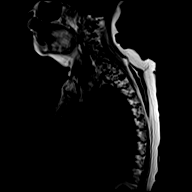

[Series 37: T2 · sagittal · 3.0mm · 1.06mm/px · 3 of 17 slices shown (1 of 2)]
[im 1/17]
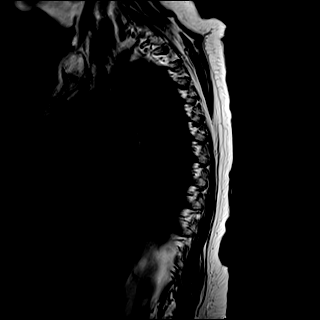
[im 9/17]
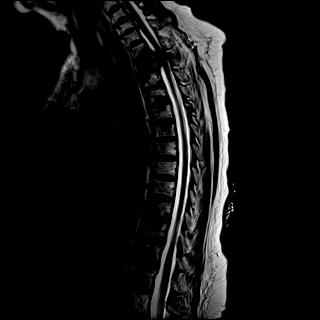
[im 17/17]
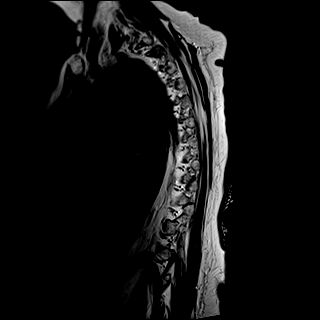

[Series 38: T1 · sagittal · 3.0mm · 1.06mm/px · 4 of 17 slices shown (2 of 3)]
[im 1/17]
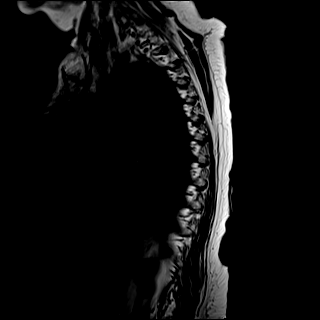
[im 6/17]
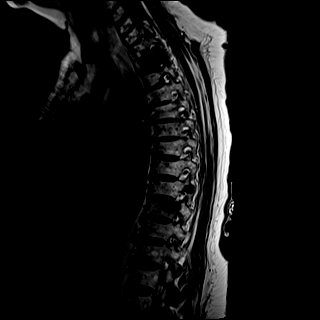
[im 11/17]
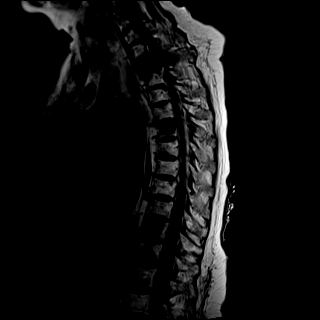
[im 17/17]
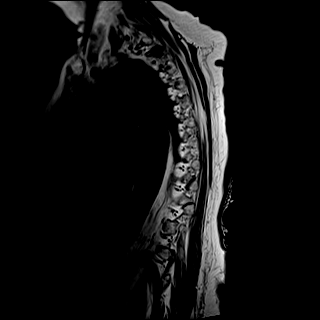

[Series 40: T2 · axial · 4.0mm · 0.59mm/px · z∈[-473,-277]mm · 8 of 39 slices shown (2 of 2)]
[im 1/39]
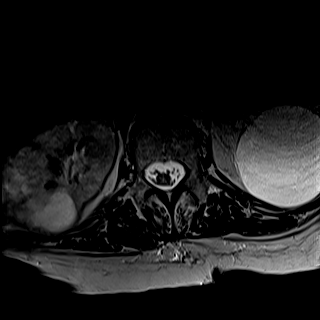
[im 6/39]
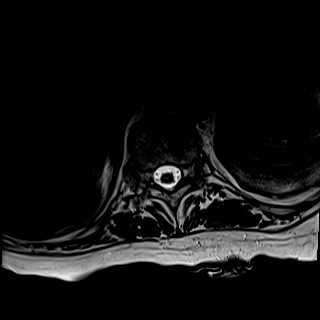
[im 11/39]
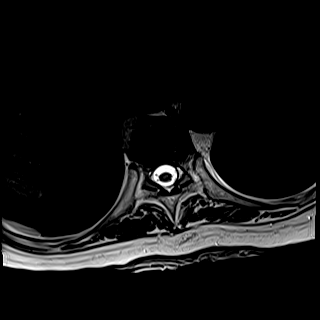
[im 17/39]
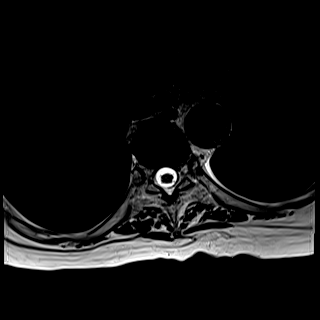
[im 22/39]
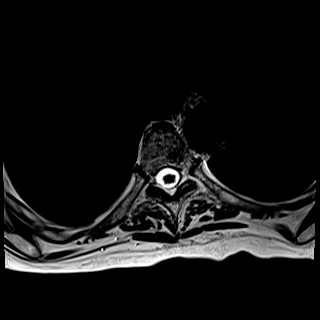
[im 28/39]
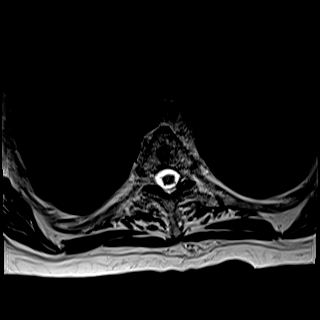
[im 33/39]
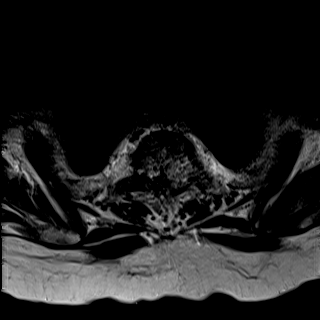
[im 39/39]
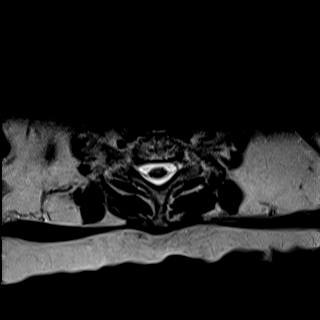

[Series 42: T1 · axial · non-contrast · 4.0mm · 0.37mm/px · z∈[-473,-277]mm · 8 of 39 slices shown (3 of 3)]
[im 1/39]
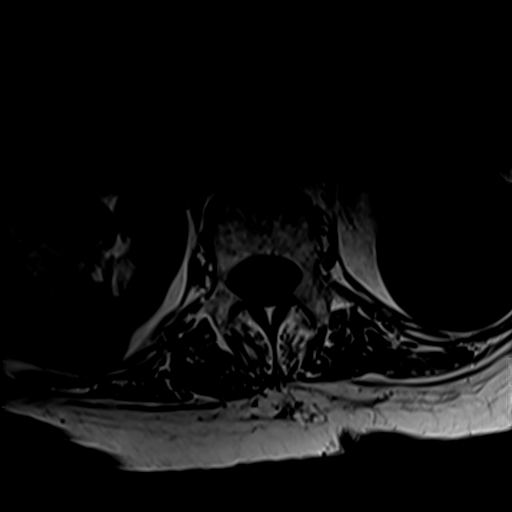
[im 6/39]
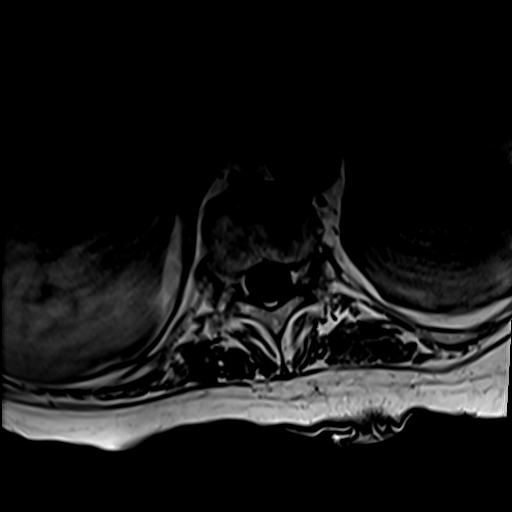
[im 11/39]
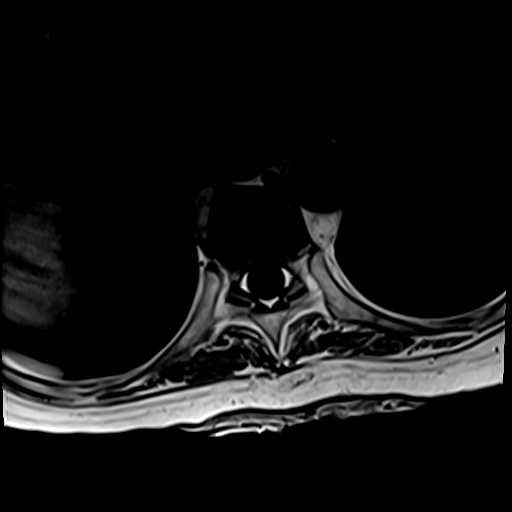
[im 17/39]
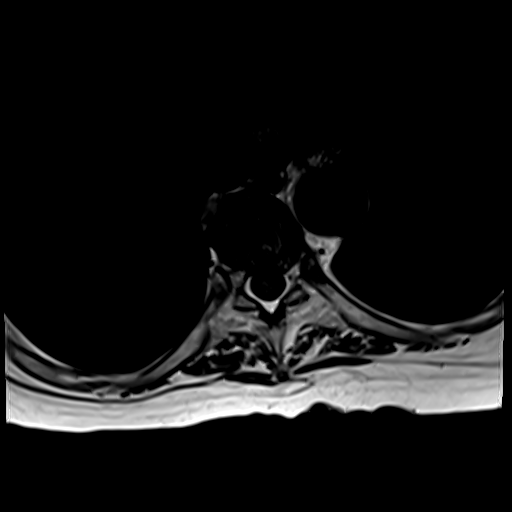
[im 22/39]
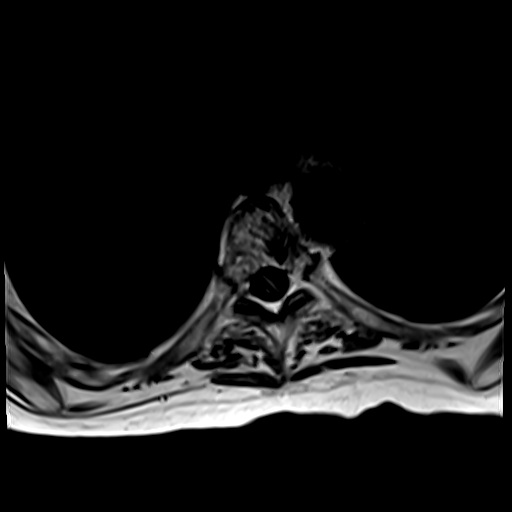
[im 28/39]
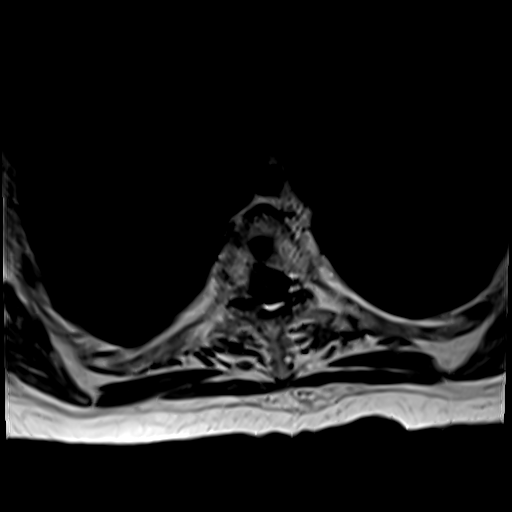
[im 33/39]
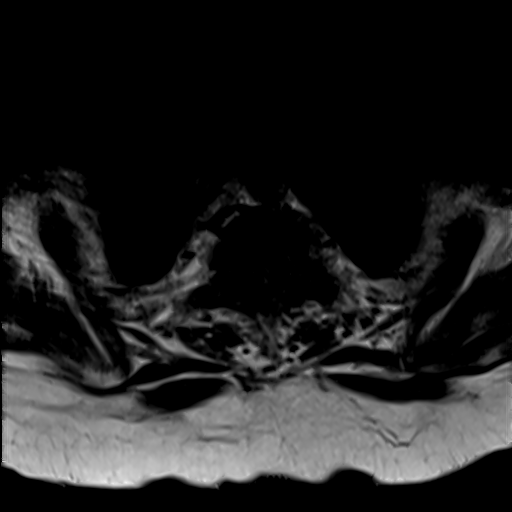
[im 39/39]
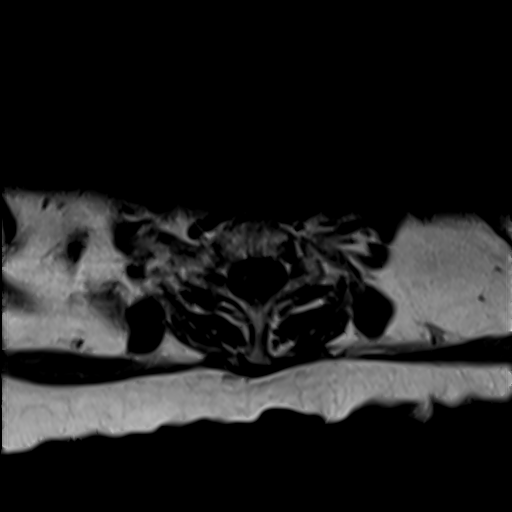

[Series 53: T1 fat-sat post-contrast · sagittal · 3.0mm · 1.06mm/px · 1 of 17 slices shown]
[im 1/17]
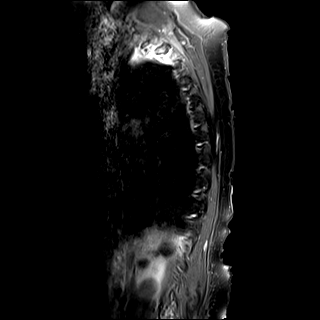

[25 of 48 positions shown; findings below may reference images not displayed]

FINDINGS: Alignment: Trace anterolisthesis of C7 on T1. Alignment otherwise
normal throughout the thoracic spine with preservation of the normal
thoracic kyphosis.

Vertebrae: Multiple metastatic lesions seen throughout the thoracic
spine, involving nearly all levels. Involvement is greatest at the
level of T2 where the T2 vertebral body is essentially completely
replaced by tumor. Associated height loss of up to 20%, consistent
with associated pathologic fracture. Extensive extra osseous and
epidural extension with tumor seen extending into the ventral and
left greater than right epidural space (series 40, image 6).
Secondary severe spinal stenosis with mass effect on the adjacent
cord, which is flattened and slightly deviated posteriorly and to
the right. Thecal sac measures approximately 5 mm in transverse
diameter at its most narrow point. No visible cord signal changes.
Tumor also extends into the left greater than right T1-2 and T2-3
neural foramina, with resultant severe stenosis on the left, and
more moderate narrowing on the right.

Otherwise, no other significant epidural or extraosseous tumor seen
elsewhere within the thoracic spine. No other definite pathologic
fracture at this time.

Cord: Severe spinal stenosis with impingement of the thoracic spinal
cord at the level of T2 as above. No definite cord signal changes.
Elsewhere, signal intensity within the thoracic spinal cord is
otherwise within normal limits. No other significant epidural or
intracanalicular tumor. No other abnormal enhancement.

Paraspinal and other soft tissues: Paraspinous soft tissues
demonstrate no other acute finding. Multiple prominent T2
hyperintense cyst noted about the visualized kidneys.

Disc levels:

Mild lower cervical spondylosis noted without high-grade stenosis.
Normal for age multilevel disc desiccation seen throughout the
thoracic spine. No other significant disc bulge or focal disc
herniation. No other spinal stenosis. Posterior element hypertrophy
at T11-12 with no more than mild foraminal narrowing. Foramina
otherwise widely patent.
IMPRESSION: 1. Widespread metastatic osseous disease throughout the thoracic
spine, with greatest involvement at the level of T2 where there is
associated pathologic fracture with up to 20% height loss. Prominent
epidural extension at this level with resultant severe spinal
stenosis and impingement of the thoracic spinal cord, but no visible
cord signal changes. Associated extension into the left greater than
right T1-2 and T2-3 neural foramina with severe stenosis on the
left. Neuro surgical consultation recommended.
2. Additional scattered osseous metastatic disease elsewhere
throughout the thoracic spine, with no other significant extra
osseous or epidural tumor at this time. No other significant
stenosis.

Current attempt is being made to contact the ordering clinician
regarding these findings. Resultant well be conveyed as soon as
possible.

ADDENDUM:
Results communicated by telephone on [DATE] at [DATE] to the
hospitalist taking care of the patient, Dr BLAIN.

*** End of Addendum ***
FINDINGS: Alignment: Trace anterolisthesis of C7 on T1. Alignment otherwise
normal throughout the thoracic spine with preservation of the normal
thoracic kyphosis.

Vertebrae: Multiple metastatic lesions seen throughout the thoracic
spine, involving nearly all levels. Involvement is greatest at the
level of T2 where the T2 vertebral body is essentially completely
replaced by tumor. Associated height loss of up to 20%, consistent
with associated pathologic fracture. Extensive extra osseous and
epidural extension with tumor seen extending into the ventral and
left greater than right epidural space (series 40, image 6).
Secondary severe spinal stenosis with mass effect on the adjacent
cord, which is flattened and slightly deviated posteriorly and to
the right. Thecal sac measures approximately 5 mm in transverse
diameter at its most narrow point. No visible cord signal changes.
Tumor also extends into the left greater than right T1-2 and T2-3
neural foramina, with resultant severe stenosis on the left, and
more moderate narrowing on the right.

Otherwise, no other significant epidural or extraosseous tumor seen
elsewhere within the thoracic spine. No other definite pathologic
fracture at this time.

Cord: Severe spinal stenosis with impingement of the thoracic spinal
cord at the level of T2 as above. No definite cord signal changes.
Elsewhere, signal intensity within the thoracic spinal cord is
otherwise within normal limits. No other significant epidural or
intracanalicular tumor. No other abnormal enhancement.

Paraspinal and other soft tissues: Paraspinous soft tissues
demonstrate no other acute finding. Multiple prominent T2
hyperintense cyst noted about the visualized kidneys.

Disc levels:

Mild lower cervical spondylosis noted without high-grade stenosis.
Normal for age multilevel disc desiccation seen throughout the
thoracic spine. No other significant disc bulge or focal disc
herniation. No other spinal stenosis. Posterior element hypertrophy
at T11-12 with no more than mild foraminal narrowing. Foramina
otherwise widely patent.
IMPRESSION: 1. Widespread metastatic osseous disease throughout the thoracic
spine, with greatest involvement at the level of T2 where there is
associated pathologic fracture with up to 20% height loss. Prominent
epidural extension at this level with resultant severe spinal
stenosis and impingement of the thoracic spinal cord, but no visible
cord signal changes. Associated extension into the left greater than
right T1-2 and T2-3 neural foramina with severe stenosis on the
left. Neuro surgical consultation recommended.
2. Additional scattered osseous metastatic disease elsewhere
throughout the thoracic spine, with no other significant extra
osseous or epidural tumor at this time. No other significant
stenosis.

Current attempt is being made to contact the ordering clinician
regarding these findings. Resultant well be conveyed as soon as
possible.

## 2020-09-12 MED ORDER — CARVEDILOL 12.5 MG PO TABS
12.5000 mg | ORAL_TABLET | Freq: Two times a day (BID) | ORAL | Status: DC
  Administered 2020-09-13 – 2020-09-23 (×19): 12.5 mg via ORAL
  Filled 2020-09-12 (×20): qty 1

## 2020-09-12 MED ORDER — ALBUTEROL SULFATE HFA 108 (90 BASE) MCG/ACT IN AERS
2.0000 | INHALATION_SPRAY | Freq: Four times a day (QID) | RESPIRATORY_TRACT | Status: DC | PRN
  Filled 2020-09-12: qty 6.7

## 2020-09-12 MED ORDER — FELODIPINE ER 10 MG PO TB24
10.0000 mg | ORAL_TABLET | Freq: Every day | ORAL | Status: DC
  Administered 2020-09-13 – 2020-09-23 (×11): 10 mg via ORAL
  Filled 2020-09-12 (×11): qty 1

## 2020-09-12 MED ORDER — LIDOCAINE 5 % EX PTCH
1.0000 | MEDICATED_PATCH | CUTANEOUS | Status: DC
  Administered 2020-09-12 – 2020-09-21 (×8): 1 via TRANSDERMAL
  Filled 2020-09-12 (×12): qty 1

## 2020-09-12 MED ORDER — POTASSIUM CHLORIDE CRYS ER 20 MEQ PO TBCR
40.0000 meq | EXTENDED_RELEASE_TABLET | Freq: Once | ORAL | Status: AC
  Administered 2020-09-12: 40 meq via ORAL
  Filled 2020-09-12: qty 2

## 2020-09-12 MED ORDER — SODIUM CHLORIDE 0.9 % IV SOLN: 200.0000 mg | Freq: Once | INTRAVENOUS | Status: DC

## 2020-09-12 MED ORDER — ATORVASTATIN CALCIUM 20 MG PO TABS
80.0000 mg | ORAL_TABLET | Freq: Every day | ORAL | Status: DC
  Administered 2020-09-12 – 2020-09-22 (×11): 80 mg via ORAL
  Filled 2020-09-12 (×11): qty 4

## 2020-09-12 MED ORDER — SENNOSIDES-DOCUSATE SODIUM 8.6-50 MG PO TABS: 1.0000 | ORAL_TABLET | Freq: Every evening | ORAL | Status: DC | PRN

## 2020-09-12 MED ORDER — PANTOPRAZOLE SODIUM 40 MG PO TBEC
40.0000 mg | DELAYED_RELEASE_TABLET | Freq: Two times a day (BID) | ORAL | Status: DC
  Administered 2020-09-12 – 2020-09-23 (×22): 40 mg via ORAL
  Filled 2020-09-12 (×23): qty 1

## 2020-09-12 MED ORDER — SODIUM CHLORIDE 0.9 % IV SOLN: 100.0000 mg | Freq: Every day | INTRAVENOUS | Status: DC

## 2020-09-12 MED ORDER — ONDANSETRON HCL 4 MG/2ML IJ SOLN: 4.0000 mg | Freq: Four times a day (QID) | INTRAMUSCULAR | Status: DC | PRN

## 2020-09-12 MED ORDER — SODIUM CHLORIDE 0.9 % IV SOLN
200.0000 mg | Freq: Once | INTRAVENOUS | Status: AC
  Administered 2020-09-13: 200 mg via INTRAVENOUS
  Filled 2020-09-12 (×2): qty 40

## 2020-09-12 MED ORDER — ACETAMINOPHEN 500 MG PO TABS
1000.0000 mg | ORAL_TABLET | Freq: Four times a day (QID) | ORAL | Status: DC | PRN
  Administered 2020-09-18 – 2020-09-20 (×2): 1000 mg via ORAL
  Filled 2020-09-12 (×2): qty 2

## 2020-09-12 MED ORDER — SALINE SPRAY 0.65 % NA SOLN
1.0000 | NASAL | Status: DC | PRN
  Filled 2020-09-12: qty 44

## 2020-09-12 MED ORDER — SODIUM CHLORIDE 0.9 % IV SOLN
100.0000 mg | Freq: Every day | INTRAVENOUS | Status: AC
  Administered 2020-09-13 – 2020-09-16 (×4): 100 mg via INTRAVENOUS
  Filled 2020-09-12: qty 100
  Filled 2020-09-12 (×2): qty 20
  Filled 2020-09-12: qty 100

## 2020-09-12 MED ORDER — LACTATED RINGERS IV SOLN: INTRAVENOUS | Status: AC

## 2020-09-12 MED ORDER — HYDROCORTISONE NA SUCCINATE PF 250 MG IJ SOLR
200.0000 mg | Freq: Once | INTRAMUSCULAR | Status: AC
  Administered 2020-09-12: 200 mg via INTRAVENOUS
  Filled 2020-09-12: qty 200

## 2020-09-12 MED ORDER — GADOBUTROL 1 MMOL/ML IV SOLN
9.0000 mL | Freq: Once | INTRAVENOUS | Status: AC | PRN
  Administered 2020-09-12: 9 mL via INTRAVENOUS

## 2020-09-12 MED ORDER — SODIUM CHLORIDE 0.9% FLUSH
3.0000 mL | Freq: Two times a day (BID) | INTRAVENOUS | Status: DC
  Administered 2020-09-12 – 2020-09-23 (×20): 3 mL via INTRAVENOUS

## 2020-09-12 MED ORDER — GUAIFENESIN-DM 100-10 MG/5ML PO SYRP: 10.0000 mL | ORAL_SOLUTION | ORAL | Status: DC | PRN

## 2020-09-12 MED ORDER — DEXAMETHASONE 0.5 MG PO TABS
2.0000 mg | ORAL_TABLET | Freq: Every day | ORAL | Status: DC
  Administered 2020-09-13 – 2020-09-23 (×11): 2 mg via ORAL
  Filled 2020-09-12 (×11): qty 4

## 2020-09-12 MED ORDER — ONDANSETRON HCL 4 MG PO TABS: 4.0000 mg | ORAL_TABLET | Freq: Four times a day (QID) | ORAL | Status: DC | PRN

## 2020-09-12 MED ORDER — SODIUM CHLORIDE 0.9 % IV BOLUS
500.0000 mL | Freq: Once | INTRAVENOUS | Status: AC
  Administered 2020-09-12: 500 mL via INTRAVENOUS

## 2020-09-12 MED ORDER — ACETAMINOPHEN 500 MG PO TABS
1000.0000 mg | ORAL_TABLET | Freq: Once | ORAL | Status: AC
  Administered 2020-09-12: 1000 mg via ORAL
  Filled 2020-09-12: qty 2

## 2020-09-12 NOTE — Consult Note (Signed)
Remdesivir - Pharmacy Brief Note     A/P:  Remdesivir 200 mg IVPB once followed by 100 mg IVPB daily x 4 days.   Dorothe Pea, PharmD, BCPS Clinical Pharmacist   09/12/2020 8:41 PM

## 2020-09-12 NOTE — ED Notes (Signed)
Patient transported to CT 

## 2020-09-12 NOTE — ED Triage Notes (Addendum)
Pt comes into the ED via EMS from home with initially call for fall and weakness, states she fell out of bed last night, thoracic back pain. Pt has multiple lido patches on back for chronic back pain , bone and lung CA, last chemo was last month, per EMS "lung sounds are junky"  Temp 98.1 ST 117 99%RA RR22 120/83 [id number]

## 2020-09-12 NOTE — ED Notes (Signed)
Patient transported to X-ray 

## 2020-09-12 NOTE — H&P (Signed)
History and Physical    Ariana Lee:[id number] DOB: March 15, 1940 DOA: 09/12/2020  PCP: Pcp, No  Patient coming from: Home via EMS  I have personally briefly reviewed patient's old medical records in [place]  Chief Complaint: Fall, weakness, back pain  HPI: Ariana Lee is a 80 y.o. 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  Patient receives her care through [facility].  She has known stage IV lung adenocarcinoma and renal cell carcinoma.  She received previous chemotherapy with pembrolizumab Hepfer treatments were stopped in early July due to decreasing performance status.  She has been having worsening generalized weakness, fatigue, and has been falling with increasing frequency despite using a walker.  She has not been able to complete her ADLs as well.  She has had poor appetite, nausea, and vomiting.  She was started on oral Decadron 2 mg daily to help with fatigue, nausea, appetite on 08/23/2020.  She was seen by her PCP on 09/07/2020 after falling again at home.  She was noted to have contusion on her head and a CT head without contrast was performed which was negative for acute intracranial abnormality or fracture.  Patient states she had another fall yesterday (09/11/2020) after she stumbled.  She did not hit her head but had some bruising to her left arm.  She has been feeling increasingly weak.  She reports weakness in both of her legs but that her left leg has been weaker compared to her right.  She says this has been going on for last 2-3 weeks.  She reports worsening cough from baseline with occasional sputum production.  She denies any significant shortness of breath.  She has been having occasional nausea with emesis.  She denies any chest pain, abdominal pain, dysuria,  diarrhea.   ED Course:  Initial vitals showed BP 155/77, pulse 112, RR 14, temp 98.0 F, SPO2 99% on room air.  Labs show WBC 16.7, hemoglobin 13.7, platelets 153,000, sodium 140, potassium 3.4, bicarb 20, BUN 20, creatinine 1.33, serum glucose 97, high-sensitivity troponin I 65 > 72, BNP 124.3, CK 115, AST 26, ALT 21, alk phos 192, total bilirubin 1.0.  2 view chest x-ray shows patchy left perihilar opacity.  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  Mild mass-effect on adjacent frontal lobe seen without significant midline shift.  Small remote appearing infarct in the right basal ganglia also noted.  CT cervical spine without contrast negative for evidence of acute fracture or traumatic malalignment.  Probable small sclerotic metastases in posterior C7 noted.  CT thoracic spine shows numerous metastases throughout the T-spine.  Largest lesion T2 with extensive bony erosive change resulting in likely severe canal and foraminal stenosis.  CT chest/abdomen/pelvis without contrast shows changes of left-sided pulmonary neoplasm with extensive bony metastatic disease.  Marked central canal narrowing due to vertebral destruction at T2 seen.  Scattered pulmonary nodules and findings of solid right renal lesions noted.  MRI brain, thoracic, and lumbar spine ordered and pending.  Patient was given 500 cc normal saline, IV Solu-Cortef 200 mg, oral K 40 mEqs.  EDP discussed with on-call neurosurgery who recommended no emergent intervention needed at this time.  May benefit from radiation therapy in the  future.  The hospitalist service was consulted to admit for further management.  Review of Systems: All systems reviewed and are negative except as documented in history of present illness above.   Past Medical History:  Diagnosis Date   Allergy    Arthritis     Asthma    Coronary artery disease    GERD (gastroesophageal reflux disease)    History of orthopnea    Hypertension    Metastatic primary lung cancer, left ([place]) 09/12/2020   Myocardial infarction [facility])    2003   Orthopnea    Peripheral vascular disease ([place])    Sleep apnea    TO BE TESTED    Past Surgical History:  Procedure Laterality Date   ABDOMINAL HYSTERECTOMY     BREAST BIOPSY Left 1992   neg   CATARACT EXTRACTION W/PHACO Right 11/12/2016   Procedure: CATARACT EXTRACTION PHACO AND INTRAOCULAR LENS PLACEMENT (IOC)-RIGHT;  Surgeon: Birder Robson, MD;  Location: [facility] ORS;  Service: Ophthalmology;  Laterality: Right;  [place] 00:51.1 AP% 19.7 CDE 10.08 Fluid pack lot # [id number]   CATARACT EXTRACTION W/PHACO Left 12/10/2016   Procedure: CATARACT EXTRACTION PHACO AND INTRAOCULAR LENS PLACEMENT (IOC);  Surgeon: Birder Robson, MD;  Location: [facility] ORS;  Service: Ophthalmology;  Laterality: Left;  [place] 00:51 AP% 19.9 CDE 10.24 Fluid pack lot # [id number]   CESAREAN SECTION     CORONARY ANGIOPLASTY     STENT 2003   SHOULDER ARTHROSCOPY     PINS   VASCULAR SURGERY     STENT RIGHT LEG    Social History:  reports that she has been smoking cigarettes. She has been smoking an average of .25 packs per day. She has never used smokeless tobacco. She reports that she does not drink alcohol and does not use drugs.  Allergies  Allergen Reactions   Ivp Dye [Iodinated Diagnostic Agents] Hives   Percocet [Oxycodone-Acetaminophen] Hives    Family History  Problem Relation Age of Onset   Prostate cancer Brother    CAD Father    Colon cancer Father      Prior to Admission medications   Medication Sig Start Date End Date Taking? Authorizing Provider  albuterol (PROVENTIL HFA;VENTOLIN HFA) 108 (90 BASE) MCG/ACT inhaler Inhale 2 puffs into the lungs every 4 (four) hours as needed for wheezing or shortness of breath.    [provider]  aspirin (ASPIRIN EC) 81 MG EC tablet  Take 81 mg by mouth daily. Swallow whole.    [provider]  atorvastatin (LIPITOR) 80 MG tablet Take 80 mg by mouth at bedtime.  08/30/14   [provider]  colchicine 0.6 MG tablet Take 1 tablet (0.6 mg total) by mouth daily. 03/04/18 03/04/19  Fisher, Linden Dolin, PA-C  diclofenac sodium (VOLTAREN) 1 % GEL Apply 2 g topically 4 (four) times daily. 03/04/18   Caryn Section, Linden Dolin, PA-C  DUREZOL 0.05 % EMUL Place 1 drop 2 (two) times daily into the right eye.  10/31/16   [provider]  felodipine (PLENDIL) 10 MG 24 hr tablet Take 10 mg by mouth daily.    [provider]  hydrochlorothiazide (HYDRODIURIL) 25 MG tablet Take 25 mg by mouth daily.    [provider]  ILEVRO 0.3 % ophthalmic suspension BEGINNING 2 DAYS BEFORE SURGERY use 1 drop into affected eye once. 12/03/16   [provider]  latanoprost (XALATAN) 0.005 % ophthalmic solution Place 1 drop into both eyes at bedtime. 10/25/16   [provider]  metoprolol tartrate (LOPRESSOR) 50 MG tablet Take 50 mg 2 (two) times daily by mouth.     [provider]  naproxen (NAPROSYN) 250 MG tablet Take 250 mg daily as needed by mouth for mild pain (for pain.).     [provider]  nitroGLYCERIN (NITROSTAT) 0.3 MG SL tablet Place 0.3 mg under the tongue every 5 (five) minutes as needed for chest pain.    [provider]  nystatin (MYCOSTATIN/NYSTOP) powder Apply 1 g topically 2 (two) times daily as needed (applied under skin folds (stomach/breast)).    [provider]  omeprazole (PRILOSEC) 20 MG capsule Take 20 mg by mouth daily before breakfast.    [provider]  predniSONE (STERAPRED UNI-PAK 21 TAB) 10 MG (21) TBPK tablet Take 6 pills on day one then decrease by 1 pill each day 03/04/18   Versie Starks, PA-C  tiotropium [facility]) 18 MCG inhalation capsule Place 1 capsule daily as needed into inhaler and inhale for shortness of breath. 09/16/16 09/16/17   [provider]    Physical Exam: Vitals:   09/12/20 1442 09/12/20 1500 09/12/20 1530 09/12/20 1806  BP: (!) 155/77 (!) 162/78 (!) 151/83 (!) 153/76  Pulse: (!) 111 (!) 108 (!) 101 (!) 103  Resp: _0 Temp: 98 F (36.7 C)     TempSrc: Oral     SpO2: 99% 100% 100% 100%  Weight:      Height:       Constitutional: Resting in bed with head elevated, appears tired but in NAD, calm, comfortable Eyes: PERRL, lids and conjunctivae normal ENMT: Mucous membranes are dry. Posterior pharynx clear of any exudate or lesions.Normal dentition.  Neck: normal, supple, no masses. Respiratory: Faint rhonchi upper lobes, no crackles. Normal respiratory effort. No accessory muscle use.  Cardiovascular: Regular rate and rhythm, no murmurs / rubs / gallops.  Trace bilateral lower extremity edema. 2+ pedal pulses. Abdomen: no tenderness, no masses palpated. No hepatosplenomegaly. Bowel sounds positive.  Musculoskeletal: no clubbing / cyanosis. No joint deformity upper and lower extremities. No contractures. Normal muscle tone.  Skin: no rashes, lesions, ulcers. No induration Neurologic: CN 2-12 grossly intact. Sensation intact. Strength 5/5 in bilateral upper extremities, 4/5 LLE and 5/5 RLE. Psychiatric: Normal judgment and insight. Alert and oriented x 3. Normal mood.   Labs on Admission: I have personally reviewed following labs and imaging studies  CBC: Recent Labs  Lab 09/12/20 1054  WBC 16.7*  HGB 13.7  HCT 42.6  MCV 85.7  PLT 665   Basic Metabolic Panel: Recent Labs  Lab 09/12/20 1054  NA 140  K 3.4*  CL 103  CO2 20*  GLUCOSE 97  BUN 20  CREATININE 1.33*  CALCIUM 9.3   GFR: Estimated Creatinine Clearance: 36.9 mL/min (A) (by C-G formula based on SCr of 1.33 mg/dL (H)). Liver Function Tests: Recent Labs  Lab 09/12/20 1350  AST 26  ALT 21  ALKPHOS 192*  BILITOT 1.0  PROT 6.2*  ALBUMIN 3.1*   No results for input(s): LIPASE, AMYLASE in the last 168  hours. No results for input(s): AMMONIA in the last 168 hours. Coagulation Profile: No results for input(s): INR, PROTIME in the last 168 hours. Cardiac Enzymes: Recent Labs  Lab 09/12/20 1350  CKTOTAL 115   BNP (last 3 results) No results for input(s): PROBNP in the last 8760 hours. HbA1C: No results for input(s): HGBA1C in the last 72 hours. CBG: Recent Labs  Lab 09/12/20 1513  GLUCAP 105*   Lipid Profile: No results for input(s): CHOL, HDL, LDLCALC, TRIG, CHOLHDL, LDLDIRECT in the last 72 hours. Thyroid Function Tests: No results for input(s): TSH, T4TOTAL, FREET4, T3FREE, THYROIDAB in the last 72 hours. Anemia Panel: No results for input(s): VITAMINB12, FOLATE, FERRITIN, TIBC, IRON, RETICCTPCT in the last 72 hours. Urine analysis:    Component Value Date/Time   COLORURINE YELLOW (A) 05/20/2017 0923   APPEARANCEUR HAZY (A) 05/20/2017 0923   LABSPEC 1.014 05/20/2017 0923   PHURINE 7.0 05/20/2017 0923   GLUCOSEU NEGATIVE 05/20/2017 0923   HGBUR NEGATIVE 05/20/2017 0923   BILIRUBINUR NEGATIVE 05/20/2017 0923   KETONESUR NEGATIVE 05/20/2017 0923   PROTEINUR NEGATIVE 05/20/2017 0923   NITRITE NEGATIVE 05/20/2017 0923   LEUKOCYTESUR SMALL (A) 05/20/2017 0923    Radiological Exams on Admission: DG Chest 2 View  Result Date: 09/12/2020 CLINICAL DATA:  Shortness of breath, history of lung cancer EXAM: CHEST - 2 VIEW COMPARISON:  2016 FINDINGS: Patchy left perihilar opacity. No pleural effusion or pneumothorax. Cardiomediastinal contours are within normal limits with normal heart size. Calcified plaque along the thoracic aorta. No acute osseous abnormality. IMPRESSION: Patchy left perihilar opacity. May reflect known lung cancer or pneumonia. Electronically Signed   By: Macy Mis M.D.   On: 09/12/2020 15:28   CT HEAD WO CONTRAST (5MM)  Result Date: 09/12/2020 CLINICAL DATA:  Head trauma, minor (Age >= 65y) EXAM: CT HEAD WITHOUT CONTRAST TECHNIQUE: Contiguous axial  images were obtained from the base of the skull through the vertex without intravenous contrast. COMPARISON:  None. FINDINGS: Brain: Approximately 6 mm low-density right cerebral convexity subdural collection. Mild mass effect on the adjacent frontal lobe without significant midline shift. No definite hyperdense component. No evidence of acute large vascular territory infarct. Remote appearing small infarcts the right basal ganglia, extending overlying corona radiata. Additional patchy white matter hypoattenuation, likely the sequela of chronic microvascular ischemic disease. Mild-to-moderate atrophy with ex vacuo ventricular dilation. No hydrocephalus. No evidence of a mass lesion. Partially empty and expanded sella. Vascular: No hyperdense vessel identified. Calcific intracranial atherosclerosis. Skull: No acute fracture. Sinuses/Orbits: Clear visualized sinuses no acute orbital findings. Other: No mastoid effusions. IMPRESSION: 1. 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 Mild mass effect on the adjacent frontal lobe without significant midline shift. 2. Small remote appearing infarct in the right basal ganglia, mild to moderate chronic microvascular ischemic disease and atrophy. 3. An MRI with contrast could provide more sensitive evaluation for metastatic disease if clinically indicated. Electronically Signed   By: Margaretha Sheffield M.D.   On: 09/12/2020 16:35   CT Cervical Spine Wo Contrast  Result Date: 09/12/2020 CLINICAL DATA:  Neck trauma (Age >= 65y) EXAM: CT CERVICAL SPINE WITHOUT CONTRAST TECHNIQUE: Multidetector CT imaging of the cervical spine was performed without intravenous contrast. Multiplanar CT image reconstructions were also generated. COMPARISON:  None. FINDINGS: Alignment: Straightening of the normal cervical lordosis. No substantial sagittal subluxation. Skull base and vertebrae: No convincing  evidence of acute fracture cervical spine. Mild height loss of multiple lower cervical vertebral bodies, favored to represent degenerative remodeling given no associated lucency, sclerosis or surrounding paraspinal edema. Small sclerotic lesions in the posterior elements at C7, suggestive metastatic given findings in the thoracic spine. Soft tissues and spinal canal: No prevertebral fluid or swelling. No visible canal hematoma. See CT of the thoracic spine for evaluation of canal findings at C2. Disc levels: Mild-to-moderate multilevel degenerative disc disease and  facet arthropathy. Upper chest: Evaluated on concurrent CT chest/abdomen/pelvis. Other: Calcific atherosclerosis of carotid arteries. IMPRESSION: 1. No convincing evidence of acute fracture or traumatic malalignment cervical spine. 2. Probable small sclerotic metastases in the posterior elements at C7. An MRI of the cervical spine with contrast provide more sensitive evaluation if clinically indicated. 3. Please see CT of thoracic spine for description of extensive metastatic disease in the thoracic spine, including epidural extension of tumor. Electronically Signed   By: Margaretha Sheffield M.D.   On: 09/12/2020 16:45   CT T-SPINE NO CHARGE  Result Date: 09/12/2020 CLINICAL DATA:  Fall W19.XXXA (ICD-10-CM) EXAM: CT THORACIC SPINE WITHOUT CONTRAST TECHNIQUE: Multidetector CT images of the thoracic were obtained using the standard protocol without intravenous contrast. COMPARISON:  None. FINDINGS: Alignment: No substantial sagittal subluxation. Vertebrae: Numerous sclerotic lesions throughout the thoracic spine, compatible with metastases. There likely lesions at every thoracic level lesions involving the vertebral bodies and posterior elements. The largest lesion is at T2 and is destructive with bony erosive change of vertebral body and posterior elements. There is extensive epidural extension tumor into the canal and left foramen with associated canal  and foraminal stenosis, potentially severe. There is height loss of the T3 vertebral body, compatible with pathologic fracture. Osteopenia. Paraspinal and other soft tissues: Please see concurrent CT chest/abdomen/pelvis for intrathoracic and intra-abdominal evaluation Disc levels: Epidural tumor at C2 level, described above. Mild multilevel thoracic degenerative change with anterior osteophytes. IMPRESSION: 1. Numerous metastases throughout the thoracic spine. The largest lesion is at T2 with extensive bony erosive change of the vertebral body and posterior elements and bulky epidural extension of tumor into the canal and left foramen at this level. Resulting canal and foraminal stenosis, likely severe. An MRI with contrast could further evaluate the extent of epidural tumor and the canal, cord and foramina if clinically indicated. 2. T2 vertebral body height loss, compatible with pathologic fracture. 3. Osteopenia. 4. Please see concurrent CT chest/abdomen/pelvis for intrathoracic and intra-abdominal evaluation Electronically Signed   By: Margaretha Sheffield M.D.   On: 09/12/2020 16:27   CT CHEST ABDOMEN PELVIS WO CONTRAST  Result Date: 09/12/2020 CLINICAL DATA:  80 year old female with history of blunt trauma after fall and weakness, thoracic back pain by report. EXAM: CT CHEST, ABDOMEN AND PELVIS WITHOUT CONTRAST TECHNIQUE: Multidetector CT imaging of the chest, abdomen and pelvis was performed following the standard protocol without IV contrast. COMPARISON:  Thoracic spine evaluation of the same date. FINDINGS: CT CHEST FINDINGS Cardiovascular: Calcified atheromatous plaque of the thoracic aorta. No aneurysmal dilation. Central pulmonary vasculature is unremarkable. Heart size is normal without pericardial effusion. Three-vessel coronary artery disease. Limited assessment of cardiovascular structures given lack of intravenous contrast. Mediastinum/Nodes: Thoracic inlet structures are unremarkable. No axillary  lymphadenopathy. No mediastinal adenopathy. Fullness of the LEFT hilum with central pulmonary mass, see below. Esophagus grossly normal. Lungs/Pleura: LEFT upper lobe mass contiguous with LEFT hilum with spiculated morphologic features and distortion of the LEFT hilum, associated with some volume loss extending into the lingula. In the axial plane this Scattered pulmonary nodules throughout the chest, for instance on image measures approximately 3.2 x 2.0 cm. 24/4 there is a small 5 mm RIGHT middle lobe pulmonary nodule. Numerous other tiny nodules in the 4-5 mm range seen in the bilateral chest. No pleural effusion. Small amount of material layering in the RIGHT mainstem bronchus, airways otherwise unremarkable aside from findings described above. Musculoskeletal: Multiple sclerotic lesions throughout the spine, see dedicated spine evaluation for further detail.  Additionally, there is destruction of the T2 thoracic vertebral body with soft tissue extending into the central canal narrowing the central canal at this level. Destruction of posterior T2 vertebral body, LEFT pedicle and with other findings better displayed in reported separately on dedicated thoracic spine CT. See below for full musculoskeletal details. CT ABDOMEN PELVIS FINDINGS Hepatobiliary: Scattered small low-density lesions in the liver incompletely characterized on noncontrast imaging some of which may represent cysts. No comparison imaging is available. No pericholecystic stranding. No gross biliary duct dilation. Pancreas: Signs of pancreatic atrophy. Spleen: Spleen is normal size without visible abnormality. Adrenals/Urinary Tract: Bilateral adrenal thickening displays low-density and maintains adreniform contours. This is more likely related to adrenal hyperplasia particularly on the LEFT. Cysts in the bilateral kidneys. No hydronephrosis. No perinephric stranding. Urinary bladder with smooth contours. No adjacent stranding. RIGHT-sided renal  lesion in the interpolar RIGHT kidney does not meet criteria for cyst and shows heterogeneity measuring 31 Hounsfield units, at 4.5 x 4.3 cm, incompletely characterized on noncontrast imaging. Fifty Hounsfield unit density, homogeneous lesion arising from the medial RIGHT kidney measures 2.5 x 2.0 cm arising from the upper posterior hilar lip. Stomach/Bowel: No acute gastrointestinal process. Vascular/Lymphatic: Calcified atheromatous plaque in the abdominal aorta without aneurysmal dilation. There is no gastrohepatic or hepatoduodenal ligament lymphadenopathy. No retroperitoneal or mesenteric lymphadenopathy. No pelvic sidewall lymphadenopathy. Reproductive: Post hysterectomy. Other: No ascites. No sign of free air. RIGHT inguinal hernia contains fat. Musculoskeletal: Numerous sclerotic foci, see thoracic spine imaging for destructive lesion at T2 which shows canal compromise. Sclerotic lesions also present in the lumbar spine mixed lytic and sclerotic focus at the L4 vertebral level. Sclerotic lesions also in the bony pelvis with destructive lesion in the LEFT greater trochanter showing early bony destruction and subtle sclerosis. Sclerotic focus on image 83 of series 2 measuring approximately cm in the LEFT iliac. Similar findings seen inferiorly on image 92 of series 2 with more subtle scattered areas of sclerosis elsewhere in the bony pelvis. Subtle area in the proximal femur seen on image 107 of series 2 may measure as much as 3.9 x 2.5 cm no sign of acute fracture of the bony pelvis. IMPRESSION: 1. 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 2. 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 MRI of the thoracic spine may be helpful as there is severe central canal narrowing associated with this finding. 3. Scattered pulmonary nodules suspicious for small pulmonary metastases in the  current context. 4. Findings suspicious for solid RIGHT renal lesions also raising the question of solid renal neoplasms. Suggest follow-up ultrasound evaluation as an initial means of evaluating this finding if the patient cannot receive intravenous contrast. 5. RIGHT inguinal hernia. 6. Aortic atherosclerosis. Aortic Atherosclerosis (ICD10-I70.0). Electronically Signed   By: Zetta Bills M.D.   On: 09/12/2020 16:45    EKG: Personally reviewed. Sinus tachycardia, rate 105, PVC.  No prior for comparison.  Assessment/Plan Principal Problem:   Generalized weakness Active Problems:   CAD (coronary artery disease), native coronary artery   Essential hypertension   Hypercholesterolemia   PAD (peripheral artery disease) (HCC)   Acute kidney injury superimposed on CKD (HCC)   Metastatic primary lung cancer, left (HCC)   Arasely H Vancamp is a 80 y.o. 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  Acute on chronic generalized weakness in the setting of COVID-19 viral infection: Patient with chronic generalized weakness/deconditioning due to underlying cancer worsened in the setting of COVID-19 viral infection.  Appears dehydrated.  Currently saturating well on room air.  CT chest shows known changes of lung cancer and pulmonary nodules but no evidence of superimposed pneumonia. -Start IV remdesivir -Continue IV fluid hydration overnight -PT/OT eval -Supplemental oxygen if needed -Continue home oral Decadron 2 mg daily for appetite/nausea/fatigue  AKI on CKD stage IIIa: Likely prerenal due to dehydration.  Continue IV fluid hydration with LR_0  mL/hour overnight.  Hold home lisinopril.  Stage IV left upper lung adenocarcinoma with osseous metastases: Follows with [facility]  on[facility].  On drug holiday (previously on Keytruda) due to poor performance status last few months.  CT imaging showing numerous metastases throughout the thoracic spine, largest at T2 with likely severe canal and foraminal stenosis which is likely contributing to her increased left lower extremity weakness. 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  Patient did have recent CT head 8/18, report in [place] did not comment on potential subdural hematoma. -Follow MRI brain -Hold home aspirin and pharmacologic VTE prophylaxis for now  Hypokalemia: Mild, supplementing.  COPD: Overall stable.  Continue albuterol as needed.  Hypertension: Resume home Coreg, felodipine.  Holding lisinopril as above.  CAD: Troponin minimally elevated 65 > 72.  Denies any chest pain.  EKG without significant acute ischemic changes.  Likely demand changes in setting of dehydration and COVID-19 viral infection.  Continue atorvastatin.  Holding aspirin for now as above.  Right renal cell carcinoma: Seen again on CT imaging.  DVT prophylaxis: SCDs only for now Code Status: Full code, discussed with patient Family Communication: Discussed with patient's son Bess Kinds by phone Disposition Plan: From home, dispo pending clinical progress Consults called: EDP discussed with on-call neurosurgery Level of care: Med-Surg Admission status:  Status is: Observation  The patient remains OBS appropriate and will d/c before 2 midnights.  Dispo: The patient is from: Home              Anticipated d/c is to:  Home versus SNF              Patient currently is not medically stable to d/c.  Zada Finders MD [facility]  If 7PM-7AM, please contact night-coverage [email or web address]  09/12/2020, 6:58 PM

## 2020-09-12 NOTE — ED Provider Notes (Signed)
[facility] Emergency Department Provider Note  ____________________________________________   Event Date/Time   First MD Initiated Contact with Patient 09/12/20 1500     (approximate)  I have reviewed the triage vital signs and the nursing notes.   HISTORY  Chief Complaint Shortness of Breath and Weakness    HPI Ariana Lee is a 80 y.o. female with history of bone and lung cancer, chronic back pain who comes in for a fall out of bed last night.  Patient notably tachycardic with EMS with sugars of 314.  According to patient ever since she started chemotherapy she has had some difficulty with ambulating.  She states that her last chemotherapy was a few weeks ago.  She gets that every 3 weeks.  She states that she had a mechanical fall yesterday when she bumped into one of her family members and fell backwards onto her back.  She reports pain in her upper back.  She states that her pain is constant, severe, was not getting better overnight and therefore she presented to the ER today.  Patient does report that she did hit her head.  Denies loss of consciousness.  She really denies any abdominal pain and states that her cough is chronic for her since starting chemotherapy.  Shortness of breath was noted in the triage note patient states that she does not really feel short of breath is more of just the pain in her upper back.  Denies any history of blood clots             Past Medical History:  Diagnosis Date   Allergy    Arthritis    Asthma    Coronary artery disease    GERD (gastroesophageal reflux disease)    History of orthopnea    Hypertension    Myocardial infarction [facility])    2003   Orthopnea    Peripheral vascular disease ([place])    Sleep apnea    TO BE TESTED    Patient Active Problem List   Diagnosis Date Noted   Weakness of both lower limbs 09/09/2014    Past Surgical History:  Procedure Laterality Date   ABDOMINAL HYSTERECTOMY      BREAST BIOPSY Left 1992   neg   CATARACT EXTRACTION W/PHACO Right 11/12/2016   Procedure: CATARACT EXTRACTION PHACO AND INTRAOCULAR LENS PLACEMENT (IOC)-RIGHT;  Surgeon: Birder Robson, MD;  Location: [facility] ORS;  Service: Ophthalmology;  Laterality: Right;  [place] 00:51.1 AP% 19.7 CDE 10.08 Fluid pack lot # [id number]   CATARACT EXTRACTION W/PHACO Left 12/10/2016   Procedure: CATARACT EXTRACTION PHACO AND INTRAOCULAR LENS PLACEMENT (IOC);  Surgeon: Birder Robson, MD;  Location: [facility] ORS;  Service: Ophthalmology;  Laterality: Left;  [place] 00:51 AP% 19.9 CDE 10.24 Fluid pack lot # [id number]   CESAREAN SECTION     CORONARY ANGIOPLASTY     STENT 2003   SHOULDER ARTHROSCOPY     PINS   VASCULAR SURGERY     STENT RIGHT LEG    Prior to Admission medications   Medication Sig Start Date End Date Taking? Authorizing Provider  albuterol (PROVENTIL HFA;VENTOLIN HFA) 108 (90 BASE) MCG/ACT inhaler Inhale 2 puffs into the lungs every 4 (four) hours as needed for wheezing or shortness of breath.    [provider]  aspirin (ASPIRIN EC) 81 MG EC tablet Take 81 mg by mouth daily. Swallow whole.    [provider]  atorvastatin (LIPITOR) 80 MG tablet Take 80 mg by mouth at bedtime.  08/30/14   [provider]  colchicine 0.6 MG tablet Take 1 tablet (0.6 mg total) by mouth daily. 03/04/18 03/04/19  Fisher, Linden Dolin, PA-C  diclofenac sodium (VOLTAREN) 1 % GEL Apply 2 g topically 4 (four) times daily. 03/04/18   Caryn Section, Linden Dolin, PA-C  DUREZOL 0.05 % EMUL Place 1 drop 2 (two) times daily into the right eye.  10/31/16   [provider]  felodipine (PLENDIL) 10 MG 24 hr tablet Take 10 mg by mouth daily.    [provider]  hydrochlorothiazide (HYDRODIURIL) 25 MG tablet Take 25 mg by mouth daily.    [provider]  ILEVRO 0.3 % ophthalmic suspension BEGINNING 2 DAYS BEFORE SURGERY use 1 drop into affected eye once. 12/03/16   [provider]  latanoprost  (XALATAN) 0.005 % ophthalmic solution Place 1 drop into both eyes at bedtime. 10/25/16   [provider]  metoprolol tartrate (LOPRESSOR) 50 MG tablet Take 50 mg 2 (two) times daily by mouth.     [provider]  naproxen (NAPROSYN) 250 MG tablet Take 250 mg daily as needed by mouth for mild pain (for pain.).     [provider]  nitroGLYCERIN (NITROSTAT) 0.3 MG SL tablet Place 0.3 mg under the tongue every 5 (five) minutes as needed for chest pain.    [provider]  nystatin (MYCOSTATIN/NYSTOP) powder Apply 1 g topically 2 (two) times daily as needed (applied under skin folds (stomach/breast)).    [provider]  omeprazole (PRILOSEC) 20 MG capsule Take 20 mg by mouth daily before breakfast.    [provider]  predniSONE (STERAPRED UNI-PAK 21 TAB) 10 MG (21) TBPK tablet Take 6 pills on day one then decrease by 1 pill each day 03/04/18   Versie Starks, PA-C  tiotropium [facility]) 18 MCG inhalation capsule Place 1 capsule daily as needed into inhaler and inhale for shortness of breath. 09/16/16 09/16/17  [provider]    Allergies Ivp dye [iodinated diagnostic agents] and Percocet [oxycodone-acetaminophen]  Family History  Problem Relation Age of Onset   Prostate cancer Brother    CAD Father    Colon cancer Father     Social History Social History   Tobacco Use   Smoking status: Every Day    Packs/day: 0.25    Types: Cigarettes   Smokeless tobacco: Never  Substance Use Topics   Alcohol use: No   Drug use: Never      Review of Systems Constitutional: No fever/chills Eyes: No visual changes. ENT: No sore throat.  Positive head Cardiovascular: No chest pain Respiratory: Positive for SOB noted in triage but patient's now denies Gastrointestinal: No abdominal pain.  No nausea, no vomiting.  No diarrhea.  No constipation. Genitourinary: Negative for dysuria. Musculoskeletal: Positive back pain skin: Negative for  rash. Neurological: Negative for headaches, focal weakness or numbness. All other ROS negative ____________________________________________   PHYSICAL EXAM:  VITAL SIGNS: ED Triage Vitals [09/12/20 1055]  Enc Vitals Group     BP 137/73     Pulse Rate 100     Resp 20     Temp 98 F (36.7 C)     Temp Source Oral     SpO2 100 %     Weight 200 lb (90.7 kg)     Height 5\' 5"  (1.651 m)     Head Circumference      Peak Flow      Pain Score      Pain  Loc      Pain Edu?      Excl. in [place]?     Constitutional: Alert and oriented. Well appearing and in no acute distress. Eyes: Conjunctivae are normal. EOMI. Head: Atraumatic. Nose: No congestion/rhinnorhea. Mouth/Throat: Mucous membranes are moist.   Neck: No stridor. Trachea Midline. FROM low C-spine tenderness Cardiovascular: Normal rate, regular rhythm. Grossly normal heart sounds.  Good peripheral circulation. Respiratory: Frequent coughing, coarse lung Gastrointestinal: Soft and nontender. No distention. No abdominal bruits.  Musculoskeletal: No lower extremity tenderness nor edema.  No joint effusions. Neurologic:  Normal speech and language. No gross focal neurologic deficits are appreciated.  Skin:  Skin is warm, dry and intact. No rash noted. Psychiatric: Mood and affect are normal. Speech and behavior are normal. GU: Deferred  Back: T-spine tenderness ____________________________________________   LABS (all labs ordered are listed, but only abnormal results are displayed)  Labs Reviewed  BASIC METABOLIC PANEL - Abnormal; Notable for the following components:      Result Value   Potassium 3.4 (*)    CO2 20 (*)    Creatinine, Ser 1.33 (*)    GFR, Estimated 40 (*)    Anion gap 17 (*)    All other components within normal limits  CBC - Abnormal; Notable for the following components:   WBC 16.7 (*)    RDW 16.4 (*)    All other components within normal limits  URINALYSIS, COMPLETE (UACMP) WITH MICROSCOPIC  CBG  MONITORING, ED   ____________________________________________   ED ECG REPORT I, Vanessa Lenox, the attending physician, personally viewed and interpreted this ECG.  Sinus tachycardia rate of 105, no ST elevation, T wave version in aVL, occasional PVC  ____________________________________________  RADIOLOGY Robert Bellow, personally viewed and evaluated these images (plain radiographs) as part of my medical decision making, as well as reviewing the written report by the radiologist.  ED MD interpretation: Patchiness on the left perihilar area  Official radiology report(s): DG Chest 2 View  Result Date: 09/12/2020 CLINICAL DATA:  Shortness of breath, history of lung cancer EXAM: CHEST - 2 VIEW COMPARISON:  2016 FINDINGS: Patchy left perihilar opacity. No pleural effusion or pneumothorax. Cardiomediastinal contours are within normal limits with normal heart size. Calcified plaque along the thoracic aorta. No acute osseous abnormality. IMPRESSION: Patchy left perihilar opacity. May reflect known lung cancer or pneumonia. Electronically Signed   By: Macy Mis M.D.   On: 09/12/2020 15:28    ____________________________________________   PROCEDURES  Procedure(s) performed (including Critical Care):  .1-3 Lead EKG Interpretation  Date/Time: 09/12/2020 3:35 PM Performed by: Vanessa [place], MD Authorized by: Vanessa [place], MD     Interpretation: abnormal     ECG rate:  100s   ECG rate assessment: tachycardic     Rhythm: sinus tachycardia     Ectopy: none     Conduction: normal     ____________________________________________   INITIAL IMPRESSION / ASSESSMENT AND PLAN / ED COURSE   Ariana Lee was evaluated in Emergency Department on 09/12/2020 for the symptoms described in the history of present illness. She was evaluated in the context of the global COVID-19 pandemic, which necessitated consideration that the patient might be at risk for infection with the SARS-CoV-2  virus that causes COVID-19. Institutional protocols and algorithms that pertain to the evaluation of patients at risk for COVID-19 are in a state of rapid change based on information released by regulatory bodies including the CDC and federal and state  organizations. These policies and algorithms were followed during the patient's care in the ED.     Patient has a history of cancer and comes with a fall notably tachycardic.  Her white count was elevated making me concern for the possibility of infection at this time do not have a source.  Will get COVID swab, urine to evaluate for UTI as well as proceed with CT imaging due to fall and new back pain.  Chest x-ray was concerning for pneumonia so we will get CT to further evaluate for potential pneumonia and add a procalcitonin.  This could just be her lung cancer however.  We will get a CT of her abdomen to make sure no evidence of abdominal infection.  Patient does have contrast allergy and I considered the possibility of PE so work-up was otherwise reassuring we will consider prepping patient for potential contrast studies.  At this time she is adamant that she does not have any shortness of breath.  I did review patient's meds with pharmacy and patient did start dexamethasone on August 3 2 mg daily for 30 days so is possible that her white count elevation could be from this however given that she is a cancer patient I think that it would be best to continue this infectious work-up.   Initial troponin was elevated we will get a repeat to trend out.  Patient was COVID-positive which could explain patient's weakness.  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  I did discuss with neurosurgery Dr. Lacinda Axon who stated this did not need repeat imaging.  However I also did review an few weeks ago she had a normal CT head so this must of happened in the last few weeks.   Also the CT scan of her spine was concerning for a pathological fracture of T8 to vertebral body as well as numerous mets with concern for possible stenosis I recommend an MRI.  Given patient is got pain there we will get MRI to further evaluate as well as an MRI of her brain to see if there is any metastatic disease there.  I discussed with the patient admission here for MRIs, pain control, treatment for COVID for her weakness due to her being high risk with chemotherapy.  She does get her chemotherapy treatments over at [facility] and wants to continue there.  But she is willing to come into the hospital         ____________________________________________   FINAL CLINICAL IMPRESSION(S) / ED DIAGNOSES   Final diagnoses:  Fall  Weakness  COVID-19  Metastatic malignant neoplasm, unspecified site [facility])     MEDICATIONS GIVEN DURING THIS VISIT:  Medications  lidocaine (LIDODERM) 5 % 1 patch (1 patch Transdermal Patch Applied 09/12/20 1535)  sodium chloride 0.9 % bolus 500 mL (500 mLs Intravenous New Bag/Given 09/12/20 1803)  acetaminophen (TYLENOL) tablet 1,000 mg (1,000 mg Oral Given 09/12/20 1533)  hydrocortisone sodium succinate (SOLU-CORTEF) injection 200 mg (200 mg Intravenous Given 09/12/20 1803)  potassium chloride SA (KLOR-CON) CR tablet 40 mEq (40 mEq Oral Given 09/12/20 1805)     ED Discharge Orders     None        Note:  This document was prepared using Dragon voice recognition software and may include unintentional dictation errors.   Vanessa [place], MD 09/12/20 [phone]

## 2020-09-13 DIAGNOSIS — K5900 Constipation, unspecified: Secondary | ICD-10-CM | POA: Diagnosis not present

## 2020-09-13 DIAGNOSIS — E876 Hypokalemia: Secondary | ICD-10-CM | POA: Diagnosis present

## 2020-09-13 DIAGNOSIS — M4804 Spinal stenosis, thoracic region: Secondary | ICD-10-CM | POA: Diagnosis present

## 2020-09-13 DIAGNOSIS — U071 COVID-19: Principal | ICD-10-CM

## 2020-09-13 DIAGNOSIS — N1831 Chronic kidney disease, stage 3a: Secondary | ICD-10-CM | POA: Diagnosis present

## 2020-09-13 DIAGNOSIS — R627 Adult failure to thrive: Secondary | ICD-10-CM

## 2020-09-13 DIAGNOSIS — C3492 Malignant neoplasm of unspecified part of left bronchus or lung: Secondary | ICD-10-CM | POA: Diagnosis not present

## 2020-09-13 DIAGNOSIS — Z6833 Body mass index (BMI) 33.0-33.9, adult: Secondary | ICD-10-CM | POA: Diagnosis not present

## 2020-09-13 DIAGNOSIS — I2510 Atherosclerotic heart disease of native coronary artery without angina pectoris: Secondary | ICD-10-CM | POA: Diagnosis present

## 2020-09-13 DIAGNOSIS — Z7189 Other specified counseling: Secondary | ICD-10-CM

## 2020-09-13 DIAGNOSIS — R531 Weakness: Secondary | ICD-10-CM | POA: Diagnosis present

## 2020-09-13 DIAGNOSIS — Z515 Encounter for palliative care: Secondary | ICD-10-CM

## 2020-09-13 DIAGNOSIS — C641 Malignant neoplasm of right kidney, except renal pelvis: Secondary | ICD-10-CM | POA: Diagnosis present

## 2020-09-13 DIAGNOSIS — C3412 Malignant neoplasm of upper lobe, left bronchus or lung: Secondary | ICD-10-CM | POA: Diagnosis present

## 2020-09-13 DIAGNOSIS — I248 Other forms of acute ischemic heart disease: Secondary | ICD-10-CM | POA: Diagnosis present

## 2020-09-13 DIAGNOSIS — E669 Obesity, unspecified: Secondary | ICD-10-CM | POA: Diagnosis present

## 2020-09-13 DIAGNOSIS — K219 Gastro-esophageal reflux disease without esophagitis: Secondary | ICD-10-CM | POA: Diagnosis present

## 2020-09-13 DIAGNOSIS — J449 Chronic obstructive pulmonary disease, unspecified: Secondary | ICD-10-CM | POA: Diagnosis present

## 2020-09-13 DIAGNOSIS — N179 Acute kidney failure, unspecified: Secondary | ICD-10-CM | POA: Diagnosis present

## 2020-09-13 DIAGNOSIS — E7800 Pure hypercholesterolemia, unspecified: Secondary | ICD-10-CM | POA: Diagnosis present

## 2020-09-13 DIAGNOSIS — Z66 Do not resuscitate: Secondary | ICD-10-CM | POA: Diagnosis not present

## 2020-09-13 DIAGNOSIS — I129 Hypertensive chronic kidney disease with stage 1 through stage 4 chronic kidney disease, or unspecified chronic kidney disease: Secondary | ICD-10-CM | POA: Diagnosis present

## 2020-09-13 DIAGNOSIS — M1990 Unspecified osteoarthritis, unspecified site: Secondary | ICD-10-CM | POA: Diagnosis present

## 2020-09-13 DIAGNOSIS — C7951 Secondary malignant neoplasm of bone: Secondary | ICD-10-CM | POA: Diagnosis present

## 2020-09-13 DIAGNOSIS — E860 Dehydration: Secondary | ICD-10-CM | POA: Diagnosis present

## 2020-09-13 DIAGNOSIS — I739 Peripheral vascular disease, unspecified: Secondary | ICD-10-CM | POA: Diagnosis present

## 2020-09-13 DIAGNOSIS — Z8249 Family history of ischemic heart disease and other diseases of the circulatory system: Secondary | ICD-10-CM | POA: Diagnosis not present

## 2020-09-13 DIAGNOSIS — F17210 Nicotine dependence, cigarettes, uncomplicated: Secondary | ICD-10-CM | POA: Diagnosis present

## 2020-09-13 LAB — CBC WITH DIFFERENTIAL/PLATELET
Abs Immature Granulocytes: 0.11 10*3/uL — ABNORMAL HIGH (ref 0.00–0.07)
Basophils Absolute: 0 10*3/uL (ref 0.0–0.1)
Basophils Relative: 0 %
Eosinophils Absolute: 0 10*3/uL (ref 0.0–0.5)
Eosinophils Relative: 0 %
HCT: 36.9 % (ref 36.0–46.0)
Hemoglobin: 11.9 g/dL — ABNORMAL LOW (ref 12.0–15.0)
Immature Granulocytes: 1 %
Lymphocytes Relative: 15 %
Lymphs Abs: 2.1 10*3/uL (ref 0.7–4.0)
MCH: 27.9 pg (ref 26.0–34.0)
MCHC: 32.2 g/dL (ref 30.0–36.0)
MCV: 86.4 fL (ref 80.0–100.0)
Monocytes Absolute: 0.9 10*3/uL (ref 0.1–1.0)
Monocytes Relative: 7 %
Neutro Abs: 10.8 10*3/uL — ABNORMAL HIGH (ref 1.7–7.7)
Neutrophils Relative %: 77 %
Platelets: 132 10*3/uL — ABNORMAL LOW (ref 150–400)
RBC: 4.27 MIL/uL (ref 3.87–5.11)
RDW: 16.7 % — ABNORMAL HIGH (ref 11.5–15.5)
WBC: 14 10*3/uL — ABNORMAL HIGH (ref 4.0–10.5)
nRBC: 0 % (ref 0.0–0.2)

## 2020-09-13 LAB — COMPREHENSIVE METABOLIC PANEL
ALT: 19 U/L (ref 0–44)
AST: 31 U/L (ref 15–41)
Albumin: 2.6 g/dL — ABNORMAL LOW (ref 3.5–5.0)
Alkaline Phosphatase: 281 U/L — ABNORMAL HIGH (ref 38–126)
Anion gap: 14 (ref 5–15)
BUN: 22 mg/dL (ref 8–23)
CO2: 23 mmol/L (ref 22–32)
Calcium: 8.8 mg/dL — ABNORMAL LOW (ref 8.9–10.3)
Chloride: 104 mmol/L (ref 98–111)
Creatinine, Ser: 1.2 mg/dL — ABNORMAL HIGH (ref 0.44–1.00)
GFR, Estimated: 45 mL/min — ABNORMAL LOW (ref >60)
Glucose, Bld: 133 mg/dL — ABNORMAL HIGH (ref 70–99)
Potassium: 3.7 mmol/L (ref 3.5–5.1)
Sodium: 141 mmol/L (ref 135–145)
Total Bilirubin: 0.9 mg/dL (ref 0.3–1.2)
Total Protein: 5.5 g/dL — ABNORMAL LOW (ref 6.5–8.1)

## 2020-09-13 LAB — MAGNESIUM: Magnesium: 1.7 mg/dL (ref 1.7–2.4)

## 2020-09-13 NOTE — Consult Note (Signed)
Referring Physician:  No referring provider defined for this encounter.  Primary Physician:  Pcp, No  Chief Complaint:  spinal metastases  History of Present Illness: 09/13/2020 Ariana Lee is a 80 y.o. female who presents with the chief complaint of back pain, weakness, and falls.  She has known history of metastatic lung cancer, right renal cell CA, CAD, COPD, CKD, HTN, PAD, who presented with falls and weakness.    She is treated and [facility], but recently stopped chemotherapy last month due to worsening KPS.    She was walking up until admission, but has not walked while in house.  She has left leg weakness and unstable gait.  She was COVID+ on admission.  Review of Systems:  A 10 point review of systems is negative, except for the pertinent positives and negatives detailed in the HPI.  Past Medical History: Past Medical History:  Diagnosis Date   Allergy    Arthritis    Asthma    Coronary artery disease    GERD (gastroesophageal reflux disease)    History of orthopnea    Hypertension    Metastatic primary lung cancer, left ([place]) 09/12/2020   Myocardial infarction [facility])    2003   Orthopnea    Peripheral vascular disease ([place])    Sleep apnea    TO BE TESTED    Past Surgical History: Past Surgical History:  Procedure Laterality Date   ABDOMINAL HYSTERECTOMY     BREAST BIOPSY Left 1992   neg   CATARACT EXTRACTION W/PHACO Right 11/12/2016   Procedure: CATARACT EXTRACTION PHACO AND INTRAOCULAR LENS PLACEMENT (IOC)-RIGHT;  Surgeon: Birder Robson, MD;  Location: [facility] ORS;  Service: Ophthalmology;  Laterality: Right;  [place] 00:51.1 AP% 19.7 CDE 10.08 Fluid pack lot # [id number]   CATARACT EXTRACTION W/PHACO Left 12/10/2016   Procedure: CATARACT EXTRACTION PHACO AND INTRAOCULAR LENS PLACEMENT (IOC);  Surgeon: Birder Robson, MD;  Location: [facility] ORS;  Service: Ophthalmology;  Laterality: Left;  [place] 00:51 AP% 19.9 CDE 10.24 Fluid pack lot # [id number]   CESAREAN SECTION      CORONARY ANGIOPLASTY     STENT 2003   SHOULDER ARTHROSCOPY     PINS   VASCULAR SURGERY     STENT RIGHT LEG    Allergies: Allergies as of 09/12/2020 - Review Complete 09/12/2020  Allergen Reaction Noted   Ivp dye [iodinated diagnostic agents] Hives 09/09/2014   Percocet [oxycodone-acetaminophen] Hives 09/09/2014    Medications:  Current Facility-Administered Medications:    acetaminophen (TYLENOL) tablet 1,000 mg, 1,000 mg, Oral, Q6H PRN, Posey Pronto, Vishal R, MD   albuterol (VENTOLIN HFA) 108 (90 Base) MCG/ACT inhaler 2 puff, 2 puff, Inhalation, Q6H PRN, Posey Pronto, Vishal R, MD   atorvastatin (LIPITOR) tablet 80 mg, 80 mg, Oral, QHS, Patel, Roxanne Mins R, MD, 80 mg at 09/12/20 2110   carvedilol (COREG) tablet 12.5 mg, 12.5 mg, Oral, BID WC, Patel, Vishal R, MD   dexamethasone (DECADRON) tablet 2 mg, 2 mg, Oral, Daily, Patel, Vishal R, MD   felodipine (PLENDIL) 24 hr tablet 10 mg, 10 mg, Oral, Daily, Patel, Vishal R, MD   guaiFENesin-dextromethorphan (ROBITUSSIN DM) 100-10 MG/5ML syrup 10 mL, 10 mL, Oral, Q4H PRN, Lenore Cordia, MD   lactated ringers infusion, , Intravenous, Continuous, Zada Finders R, MD, Last Rate: 100 mL/hr at 09/13/20 0320, Infusion Verify at 09/13/20 0320   lidocaine (LIDODERM) 5 % 1 patch, 1 patch, Transdermal, Q24H, Vanessa [address], MD, 1 patch at 09/12/20 1535   ondansetron (ZOFRAN) tablet 4  mg, 4 mg, Oral, Q6H PRN **OR** ondansetron (ZOFRAN) injection 4 mg, 4 mg, Intravenous, Q6H PRN, Posey Pronto, Vishal R, MD   pantoprazole (PROTONIX) EC tablet 40 mg, 40 mg, Oral, BID, Zada Finders R, MD, 40 mg at 09/12/20 2110   [COMPLETED] remdesivir 200 mg in sodium chloride 0.9% 250 mL IVPB, 200 mg, Intravenous, Once, Stopped at 09/13/20 0053 **FOLLOWED BY** remdesivir 100 mg in sodium chloride 0.9 % 100 mL IVPB, 100 mg, Intravenous, Daily, Sherlean Foot, Carissa E, RPH   senna-docusate (Senokot-S) tablet 1 tablet, 1 tablet, Oral, QHS PRN, Posey Pronto, Vishal R, MD   sodium chloride (OCEAN) 0.65 %  nasal spray 1-2 spray, 1-2 spray, Each Nare, PRN, Posey Pronto, Vishal R, MD   sodium chloride flush (NS) 0.9 % injection 3 mL, 3 mL, Intravenous, Q12H, Zada Finders R, MD, 3 mL at 09/12/20 2110   Social History: Social History   Tobacco Use   Smoking status: Every Day    Packs/day: 0.25    Types: Cigarettes   Smokeless tobacco: Never  Substance Use Topics   Alcohol use: No   Drug use: Never    Family Medical History: Family History  Problem Relation Age of Onset   Prostate cancer Brother    CAD Father    Colon cancer Father     Physical Examination: Vitals:   09/12/20 2100 09/13/20 0442  BP: (!) 148/65 117/86  Pulse: 91 91  Resp: 20 16  Temp: 98.5 F (36.9 C) (!) 97.5 F (36.4 C)  SpO2: 100% 100%     General: Patient is well developed, well nourished, calm, collected, and in no apparent distress.  Psychiatric: Patient is non-anxious.  Head:  Pupils equal, round, and reactive to light.  ENT:  Oral mucosa appears well hydrated.  Neck:   Supple.  Full range of motion.  Respiratory: Patient is breathing without any difficulty.  Extremities: No edema.  Vascular: Palpable pulses in dorsal pedal vessels.  Skin:   On exposed skin, there are no abnormal skin lesions.  NEUROLOGICAL:  General: In no acute distress.   Awake, alert, oriented to person, place, and time.  Pupils equal round and reactive to light.  Facial tone is symmetric.  Tongue protrusion is midline.  There is no pronator drift.   Strength: Side Biceps Triceps Deltoid Interossei Grip Wrist Ext. Wrist Flex.  R 5 5 5 5 5 5 5   L 5 5 5 5 5 5 5    Side Iliopsoas Quads Hamstring PF DF EHL  R 5 5 5 5 5 5   L 4+ 4+ 5 5 5 5     Bilateral upper and lower extremity sensation is intact to light touch. Reflexes are 1+ and symmetric at the biceps, triceps, brachioradialis, patella and achilles. Hoffman's is absent.  Clonus is not present.   Gait is untested due to instability.  Imaging: MRI Brain TL spine  09/12/2020 IMPRESSION: 1. No acute intracranial abnormality. No evidence for intracranial metastatic disease. 2. Small bilateral subdural hygromas measuring up to 6 mm on the right and 2 mm on the left. Relatively mild mass effect on the subjacent right cerebral hemisphere without significant midline shift. 3. Underlying age-related cerebral atrophy with moderate chronic microvascular ischemic disease. Small remote lacunar infarct at the right lentiform nucleus.     Electronically Signed   By: Jeannine Boga M.D.   On: 09/13/2020 00:12  IMPRESSION: 1. 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 Prominent epidural extension at this level with resultant severe spinal stenosis and impingement of the thoracic spinal cord, but no visible cord signal changes. Associated extension into the left greater than right T1-2 and T2-3 neural foramina with severe stenosis on the left. Neuro surgical consultation recommended. 2. Additional scattered osseous metastatic disease elsewhere throughout the thoracic spine, with no other significant extra osseous or epidural tumor at this time. No other significant stenosis.   Current attempt is being made to contact the ordering clinician regarding these findings. Resultant well be conveyed as soon as possible.   Electronically Signed: By: Jeannine Boga M.D. On: 09/13/2020 01:15  IMPRESSION: 1. Widespread osseous metastatic disease throughout the lumbar spine and visualized pelvis. No significant extra osseous or epidural involvement. No pathologic fracture or other complication. 2. 5.8 cm mass arising from the interpolar right kidney, concerning for an additional possible primary renal cell neoplasm. Further evaluation with dedicated renal mass protocol CT and/or MRI recommended for further  evaluation. 3. Mild for age degenerative spondylosis and facet hypertrophy throughout the lumbar spine without significant spinal stenosis. Associated mild bilateral L3 through L5 foraminal narrowing.     Electronically Signed   By: Jeannine Boga M.D.   On: 09/13/2020 01:29  I have personally reviewed the images and agree with the above interpretation.  Labs: CBC Latest Ref Rng & Units 09/12/2020 03/04/2018 09/10/2014  WBC 4.0 - 10.5 K/uL 16.7(H) 9.9 14.0(H)  Hemoglobin 12.0 - 15.0 g/dL 13.7 10.5(L) 11.3(L)  Hematocrit 36.0 - 46.0 % 42.6 34.4(L) 35.6  Platelets 150 - 400 K/uL 153 498(H) 263       Assessment and Plan: Ms. Waddell is a pleasant 80 y.o. female with metastatic disease from metastatic lung cancer.    Due to worsening KPS and lack of systemic treatment, surgical intervention is not indicated.  I would recommend radiation oncology consideration.  She should continue dexamethasone for now.  One could consider transfer to [facility] for XRT where her primary oncologist is if we are unable to provide urgent XRT at [facility].      Teaghan Formica K. Izora Ribas MD, [place] Dept. of Neurosurgery

## 2020-09-13 NOTE — Progress Notes (Signed)
PROGRESS NOTE    Ariana Lee   SFS:[id number]  DOB: 07/31/1940  PCP: Pcp, No    DOA: 09/12/2020 LOS: 0    Brief Narrative / Hospital Course to Date:   Per HPI on Admission: "Ariana Lee is a 80 y.o. 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  Patient has been on chemotherapy but was stopped recently due to worsening performance status.    Neurosurgery consulted, recommend consideration of palliative radiation therapy.  No surgical intervention indicated.  Assessment & Plan   Principal Problem:   Generalized weakness Active Problems:   CAD (coronary artery disease), native coronary artery   Essential hypertension   Hypercholesterolemia   PAD (peripheral artery disease) (HCC)   Acute kidney injury superimposed on CKD (HCC)   Metastatic primary lung cancer, left ([place])   COVID-19 virus infection   COVID-19 infection Acute on chronic generalized weakness Poor p.o. intake Continue remdesivir. Follow-up PT and OT recommendations Not hypoxic on admission, monitor O2 sats and supplement if needed to maintain sat above 90% Continue home oral Decadron 2 mg daily for appetite, nausea and fatigue  AKI superimposed on CKD stage IIIa -AKI present on admission and most likely due to dehydration, prerenal azotemia.  Treated with IV fluids overnight on admission.  Lisinopril held.  Monitor BMP.  Stage IV left upper lobe adenocarcinoma of lung with osseous metastases -follows with oncology at [facility].  Previously on Keytruda, currently on drug holiday due to poor  performance status and progressively worsening over last few months.  CT imaging here shows widespread vertebral metastases with associated spinal canal and foraminal stenosis and resulting lower extremity weakness. 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  On review of 8/18 head CT no comment of subdural hematoma not reported. MRI brain here shows nothing acute, no intracranial metastatic disease, small bilateral subdural hygromas measuring up to 6 mm on the right and 2 mm on left with relatively mild mass-effect on the subjacent right cerebral hemisphere without significant midline shift.  Hypokalemia -resolved with replacement.  Monitor replace as needed  COPD -stable without acute exacerbation.  Continue as needed albuterol.  Hypertension -continue Coreg and follow to pain.  Lisinopril on hold as above.  CAD -with mild but flat troponin elevation but no chest pain or acute ischemic EKG changes.  Likely demand ischemia in the setting of dehydration and COVID-19 infection.  Continue atorvastatin.  Aspirin on hold  Right renal cell carcinoma -seen on CT imaging.  No acute issues.  Monitor renal function  Obesity: Body mass index is 33.28 kg/m.  Complicates overall care and prognosis.  Recommend lifestyle modifications including physical activity and diet for weight loss and overall long-term health.   DVT prophylaxis: SCDs Start: 09/12/20 1940   Diet:  Diet Orders (From admission, onward)     Start     Ordered   09/12/20 1939  Diet regular  Room service appropriate? Yes; Fluid consistency: Thin  Diet effective now       Question Answer Comment  Room service  appropriate? Yes   Fluid consistency: Thin      09/12/20 1938              Code Status: Full Code   Subjective 09/13/20    Patient seated edge of bed working with PT when seen today.  She does not know how she got COVID infection and is frustrated because she got her shots and always stays home.  She denies any back pain.  Says she feels generally weak and is having difficulty maintaining upright position while seated edge of bed.   Disposition Plan & Communication   Status is: Inpatient  Remains inpatient appropriate because:IV treatments appropriate due to intensity of illness or inability to take PO  Dispo: The patient is from: Home              Anticipated d/c is to: SNF              Patient currently is not medically stable to d/c.   Difficult to place patient No    Consults, Procedures, Significant Events   Consultants:  Palliative care Neurosurgery  Procedures:  None  Antimicrobials:  Anti-infectives (From admission, onward)    Start     Dose/Rate Route Frequency Ordered Stop   09/13/20 1000  remdesivir 100 mg in sodium chloride 0.9 % 100 mL IVPB  Status:  Discontinued       See Hyperspace for full Linked Orders Report.   100 mg 200 mL/hr over 30 Minutes Intravenous Daily 09/12/20 1943 09/12/20 1959   09/13/20 1000  remdesivir 100 mg in sodium chloride 0.9 % 100 mL IVPB       See Hyperspace for full Linked Orders Report.   100 mg 200 mL/hr over 30 Minutes Intravenous Daily 09/12/20 1959 09/17/20 0959   09/12/20 2200  remdesivir 200 mg in sodium chloride 0.9% 250 mL IVPB       See Hyperspace for full Linked Orders Report.   200 mg 580 mL/hr over 30 Minutes Intravenous  Once 09/12/20 1959 09/13/20 0053   09/12/20 1945  remdesivir 200 mg in sodium chloride 0.9% 250 mL IVPB  Status:  Discontinued       See Hyperspace for full Linked Orders Report.   200 mg 580 mL/hr over 30 Minutes Intravenous Once 09/12/20 1943 09/12/20 1959         Micro     Objective   Vitals:   09/12/20 2000 09/12/20 2100 09/13/20 0442 09/13/20 0927  BP: (!) 158/80 (!) 148/65 117/86 (!) 151/71  Pulse: 77 91 91 92  Resp: 14 20 16 16   Temp:  98.5 F (36.9 C) (!) 97.5 F (36.4 C) 97.8 F (36.6 C)  TempSrc:  Oral Oral Oral  SpO2: 97% 100% 100% 100%  Weight:      Height:  5\' 5"  (1.651 m)      Intake/Output Summary (Last 24 hours) at 09/13/2020 1546 Last data filed at 09/13/2020 0320 Gross per 24 hour  Intake 509.9 ml  Output --  Net 509.9 ml   Filed Weights   09/12/20 1055  Weight: 90.7 kg    Physical Exam:  General exam: awake, alert, no acute distress, obese HEENT: atraumatic, clear conjunctiva, anicteric sclera, moist mucus membranes, hearing grossly normal  Respiratory system: CTAB, no wheezes, rales or rhonchi, normal respiratory effort. Cardiovascular system: normal S1/S2, RRR, nonpitting  lower extremity edema.   Gastrointestinal system: soft, nontender abdomen Central nervous system: A&O x3. no gross focal neurologic deficits, normal speech Extremities: moves all, no edema, normal tone Psychiatry: normal mood, congruent affect, judgement and insight appear normal  Labs   Data Reviewed: I have personally reviewed following labs and imaging studies  CBC: Recent Labs  Lab 09/12/20 1054 09/13/20 1018  WBC 16.7* 14.0*  NEUTROABS  --  10.8*  HGB 13.7 11.9*  HCT 42.6 36.9  MCV 85.7 86.4  PLT 153 836*   Basic Metabolic Panel: Recent Labs  Lab 09/12/20 1054 09/13/20 1018  NA 140 141  K 3.4* 3.7  CL 103 104  CO2 20* 23  GLUCOSE 97 133*  BUN 20 22  CREATININE 1.33* 1.20*  CALCIUM 9.3 8.8*  MG  --  1.7   GFR: Estimated Creatinine Clearance: 40.9 mL/min (A) (by C-G formula based on SCr of 1.2 mg/dL (H)). Liver Function Tests: Recent Labs  Lab 09/12/20 1350 09/13/20 1018  AST 26 31  ALT 21 19  ALKPHOS 192* 281*  BILITOT 1.0 0.9  PROT 6.2* 5.5*  ALBUMIN 3.1* 2.6*   No results for input(s): LIPASE, AMYLASE in  the last 168 hours. No results for input(s): AMMONIA in the last 168 hours. Coagulation Profile: No results for input(s): INR, PROTIME in the last 168 hours. Cardiac Enzymes: Recent Labs  Lab 09/12/20 1350  CKTOTAL 115   BNP (last 3 results) No results for input(s): PROBNP in the last 8760 hours. HbA1C: No results for input(s): HGBA1C in the last 72 hours. CBG: Recent Labs  Lab 09/12/20 1513  GLUCAP 105*   Lipid Profile: No results for input(s): CHOL, HDL, LDLCALC, TRIG, CHOLHDL, LDLDIRECT in the last 72 hours. Thyroid Function Tests: No results for input(s): TSH, T4TOTAL, FREET4, T3FREE, THYROIDAB in the last 72 hours. Anemia Panel: No results for input(s): VITAMINB12, FOLATE, FERRITIN, TIBC, IRON, RETICCTPCT in the last 72 hours. Sepsis Labs: Recent Labs  Lab 09/12/20 1802  PROCALCITON 0.56    Recent Results (from the past 240 hour(s))  Resp Panel by RT-PCR (Flu A&B, Covid) Nasopharyngeal Swab     Status: Abnormal   Collection Time: 09/12/20  3:12 PM   Specimen: Nasopharyngeal Swab; Nasopharyngeal(NP) swabs in vial transport medium  Result Value Ref Range Status   SARS Coronavirus 2 by RT PCR POSITIVE (A) NEGATIVE Final    Comment: RESULT CALLED TO, READ BACK BY AND VERIFIED WITH: KATIE RAND AT 6294 ON 09/12/20 BY SS (NOTE) SARS-CoV-2 target nucleic acids are DETECTED.  The SARS-CoV-2 RNA is generally detectable in upper respiratory specimens during the acute phase of infection. Positive results are indicative of the presence of the identified virus, but do not rule out bacterial infection or co-infection with other pathogens not detected by the test. Clinical correlation with patient history and other diagnostic information is necessary to determine patient infection status. The expected result is Negative.  Fact Sheet for Patients: EntrepreneurPulse.com.au  Fact Sheet for Healthcare  Providers: IncredibleEmployment.be  This test is not yet approved or cleared by the [place] FDA and  has been authorized for detection and/or diagnosis of SARS-CoV-2 by FDA under an Emergency Use Authorization (EUA).  This EUA will remain in effect (meaning this test can  be used) for the duration of  the COVID-19 declaration under Section 564(b)(1) of the Act, 21 U.S.C. section 360bbb-3(b)(1), unless the authorization is terminated or revoked sooner.     Influenza A by PCR NEGATIVE NEGATIVE Final  Influenza B by PCR NEGATIVE NEGATIVE Final    Comment: (NOTE) The Xpert Xpress SARS-CoV-2/FLU/RSV plus assay is intended as an aid in the diagnosis of influenza from Nasopharyngeal swab specimens and should not be used as a sole basis for treatment. Nasal washings and aspirates are unacceptable for Xpert Xpress SARS-CoV-2/FLU/RSV testing.  Fact Sheet for Patients: EntrepreneurPulse.com.au  Fact Sheet for Healthcare Providers: IncredibleEmployment.be  This test is not yet approved or cleared by the [place] FDA and has been authorized for detection and/or diagnosis of SARS-CoV-2 by FDA under an Emergency Use Authorization (EUA). This EUA will remain in effect (meaning this test can be used) for the duration of the COVID-19 declaration under Section 564(b)(1) of the Act, 21 U.S.C. section 360bbb-3(b)(1), unless the authorization is terminated or revoked.  Performed at [facility], [address] [place], [place] [address]       Imaging Studies   DG Chest 2 View  Result Date: 09/12/2020 CLINICAL DATA:  Shortness of breath, history of lung cancer EXAM: CHEST - 2 VIEW COMPARISON:  2016 FINDINGS: Patchy left perihilar opacity. No pleural effusion or pneumothorax. Cardiomediastinal contours are within normal limits with normal heart size. Calcified plaque along the thoracic aorta. No acute osseous  abnormality. IMPRESSION: Patchy left perihilar opacity. May reflect known lung cancer or pneumonia. Electronically Signed   By: Macy Mis M.D.   On: 09/12/2020 15:28   CT HEAD WO CONTRAST (5MM)  Result Date: 09/12/2020 CLINICAL DATA:  Head trauma, minor (Age >= 65y) EXAM: CT HEAD WITHOUT CONTRAST TECHNIQUE: Contiguous axial images were obtained from the base of the skull through the vertex without intravenous contrast. COMPARISON:  None. FINDINGS: Brain: Approximately 6 mm low-density right cerebral convexity subdural collection. Mild mass effect on the adjacent frontal lobe without significant midline shift. No definite hyperdense component. No evidence of acute large vascular territory infarct. Remote appearing small infarcts the right basal ganglia, extending overlying corona radiata. Additional patchy white matter hypoattenuation, likely the sequela of chronic microvascular ischemic disease. Mild-to-moderate atrophy with ex vacuo ventricular dilation. No hydrocephalus. No evidence of a mass lesion. Partially empty and expanded sella. Vascular: No hyperdense vessel identified. Calcific intracranial atherosclerosis. Skull: No acute fracture. Sinuses/Orbits: Clear visualized sinuses no acute orbital findings. Other: No mastoid effusions. IMPRESSION: 1. 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 Mild mass effect on the adjacent frontal lobe without significant midline shift. 2. Small remote appearing infarct in the right basal ganglia, mild to moderate chronic microvascular ischemic disease and atrophy. 3. An MRI with contrast could provide more sensitive evaluation for metastatic disease if clinically indicated. Electronically Signed   By: Margaretha Sheffield M.D.   On: 09/12/2020 16:35   CT Cervical Spine Wo Contrast  Result Date: 09/12/2020 CLINICAL DATA:  Neck trauma (Age >= 65y) EXAM: CT CERVICAL SPINE WITHOUT  CONTRAST TECHNIQUE: Multidetector CT imaging of the cervical spine was performed without intravenous contrast. Multiplanar CT image reconstructions were also generated. COMPARISON:  None. FINDINGS: Alignment: Straightening of the normal cervical lordosis. No substantial sagittal subluxation. Skull base and vertebrae: No convincing evidence of acute fracture cervical spine. Mild height loss of multiple lower cervical vertebral bodies, favored to represent degenerative remodeling given no associated lucency, sclerosis or surrounding paraspinal edema. Small sclerotic lesions in the posterior elements at C7, suggestive metastatic given findings in the thoracic spine. Soft tissues and spinal canal: No prevertebral fluid or swelling. No visible canal hematoma. See CT  of the thoracic spine for evaluation of canal findings at C2. Disc levels: Mild-to-moderate multilevel degenerative disc disease and facet arthropathy. Upper chest: Evaluated on concurrent CT chest/abdomen/pelvis. Other: Calcific atherosclerosis of carotid arteries. IMPRESSION: 1. No convincing evidence of acute fracture or traumatic malalignment cervical spine. 2. Probable small sclerotic metastases in the posterior elements at C7. An MRI of the cervical spine with contrast provide more sensitive evaluation if clinically indicated. 3. Please see CT of thoracic spine for description of extensive metastatic disease in the thoracic spine, including epidural extension of tumor. Electronically Signed   By: Margaretha Sheffield M.D.   On: 09/12/2020 16:45   MR Brain W and Wo Contrast  Result Date: 09/13/2020 CLINICAL DATA:  Initial evaluation for brain mass or lesion. Previous studies concerning for pulmonary neoplasm with metastatic disease. EXAM: MRI HEAD WITHOUT AND WITH CONTRAST TECHNIQUE: Multiplanar, multiecho pulse sequences of the brain and surrounding structures were obtained without and with intravenous contrast. CONTRAST:  43mL GADAVIST GADOBUTROL 1  MMOL/ML IV SOLN COMPARISON:  Prior CT from earlier the same day. FINDINGS: Brain: Examination mildly degraded by motion artifact. Generalized age-related cerebral atrophy. Patchy T2/FLAIR hyperintensity involving the periventricular, deep, and subcortical white matter both cerebral hemispheres most consistent with chronic small vessel ischemic disease, moderate nature. Superimposed remote lacunar infarct at the right lentiform nucleus. No foci of diffusion abnormality to suggest acute or subacute ischemia. Gray-white matter differentiation maintained. No encephalomalacia to suggest chronic cortical infarction. No acute intracranial hemorrhage. Single subcentimeter focus of susceptibility artifact at the anterior right frontal corona radiata noted (series 15, image 31), likely a small chronic microhemorrhage. No acute blood products seen at this location on prior CT. No mass lesion. No hydrocephalus. Small T2 hyperintense extra-axial collections seen overlying the bilateral cerebral convexities, consistent with hygromas. These measure up to 6 mm on the right and 2 mm on the left. Relatively mild mass effect on the subjacent right cerebral hemisphere without significant midline shift. Pituitary gland and suprasellar region normal. Midline structures intact. No abnormal enhancement. Vascular: Major intracranial vascular flow voids are grossly maintained at the skull base. Skull and upper cervical spine: Craniocervical junction within normal limits. Bone marrow signal intensity normal. No visible focal molar placing lesion. No scalp soft tissue abnormality. Sinuses/Orbits: Patient status post bilateral ocular lens replacement. Globes and orbital soft tissues demonstrate no acute finding. Paranasal sinuses are largely clear. No mastoid effusion. Inner ear structures grossly normal. Other: None. IMPRESSION: 1. No acute intracranial abnormality. No evidence for intracranial metastatic disease. 2. Small bilateral subdural  hygromas measuring up to 6 mm on the right and 2 mm on the left. Relatively mild mass effect on the subjacent right cerebral hemisphere without significant midline shift. 3. Underlying age-related cerebral atrophy with moderate chronic microvascular ischemic disease. Small remote lacunar infarct at the right lentiform nucleus. Electronically Signed   By: Jeannine Boga M.D.   On: 09/13/2020 00:12   MR THORACIC SPINE W WO CONTRAST  Addendum Date: 09/13/2020   ADDENDUM REPORT: 09/13/2020 01:31 ADDENDUM: Results communicated by telephone on 09/13/2020 at 1:17 am to the hospitalist taking care of the patient, Dr Zada Finders. Electronically Signed   By: Jeannine Boga M.D.   On: 09/13/2020 01:31   Result Date: 09/13/2020 CLINICAL DATA:  Initial evaluation for metastatic lung carcinoma. EXAM: MRI THORACIC WITHOUT AND WITH CONTRAST TECHNIQUE: Multiplanar and multiecho pulse sequences of the thoracic spine were obtained without and with intravenous contrast. CONTRAST:  75mL GADAVIST GADOBUTROL 1 MMOL/ML IV SOLN  COMPARISON:  Prior CT from earlier the same day. FINDINGS: Alignment: Trace anterolisthesis of C7 on T1. Alignment otherwise normal throughout the thoracic spine with preservation of the normal thoracic kyphosis. Vertebrae: Multiple metastatic lesions seen throughout the thoracic spine, involving nearly all levels. Involvement is greatest at the level of T2 where the T2 vertebral body is essentially completely replaced by tumor. Associated height loss of up to 20%, consistent with associated pathologic fracture. Extensive extra osseous and epidural extension with tumor seen extending into the ventral and left greater than right epidural space (series 40, image 6). Secondary severe spinal stenosis with mass effect on the adjacent cord, which is flattened and slightly deviated posteriorly and to the right. Thecal sac measures approximately 5 mm in transverse diameter at its most narrow point. No  visible cord signal changes. Tumor also extends into the left greater than right T1-2 and T2-3 neural foramina, with resultant severe stenosis on the left, and more moderate narrowing on the right. Otherwise, no other significant epidural or extraosseous tumor seen elsewhere within the thoracic spine. No other definite pathologic fracture at this time. Cord: Severe spinal stenosis with impingement of the thoracic spinal cord at the level of T2 as above. No definite cord signal changes. Elsewhere, signal intensity within the thoracic spinal cord is otherwise within normal limits. No other significant epidural or intracanalicular tumor. No other abnormal enhancement. Paraspinal and other soft tissues: Paraspinous soft tissues demonstrate no other acute finding. Multiple prominent T2 hyperintense cyst noted about the visualized kidneys. Disc levels: Mild lower cervical spondylosis noted without high-grade stenosis. Normal for age multilevel disc desiccation seen throughout the thoracic spine. No other significant disc bulge or focal disc herniation. No other spinal stenosis. Posterior element hypertrophy at T11-12 with no more than mild foraminal narrowing. Foramina otherwise widely patent. IMPRESSION: 1. Widespread metastatic osseous disease throughout the thoracic spine, with greatest involvement at the level of T2 where there is associated pathologic fracture with up to 20% height loss. Prominent epidural extension at this level with resultant severe spinal stenosis and impingement of the thoracic spinal cord, but no visible cord signal changes. Associated extension into the left greater than right T1-2 and T2-3 neural foramina with severe stenosis on the left. Neuro surgical consultation recommended. 2. Additional scattered osseous metastatic disease elsewhere throughout the thoracic spine, with no other significant extra osseous or epidural tumor at this time. No other significant stenosis. Current attempt is  being made to contact the ordering clinician regarding these findings. Resultant well be conveyed as soon as possible. Electronically Signed: By: Jeannine Boga M.D. On: 09/13/2020 01:15   MR Lumbar Spine W Wo Contrast  Result Date: 09/13/2020 CLINICAL DATA:  Initial evaluation for metastatic lung carcinoma, back pain, recent fall. EXAM: MRI LUMBAR SPINE WITHOUT AND WITH CONTRAST TECHNIQUE: Multiplanar and multiecho pulse sequences of the lumbar spine were obtained without and with intravenous contrast. CONTRAST:  70mL GADAVIST GADOBUTROL 1 MMOL/ML IV SOLN COMPARISON:  Prior CT from earlier the same day. FINDINGS: Segmentation: Standard. Lowest well-formed disc space labeled the L5-S1 level. Alignment: Trace anterolisthesis of L3 on L4, with 4 mm anterolisthesis of L4 on L5, and 3 mm anterolisthesis of L5 on S1. Findings chronic and facet mediated. Vertebrae: Multiple scattered osseous metastases seen throughout the lumbar spine as well as the visualized sacrum and pelvis. Involvement of essentially all levels. For reference purposes, the largest lesion is positioned at L4 and measures approximately 2.8 cm. No significant extra osseous or epidural extension is  seen. No visible associated pathologic fracture or other complication. Conus medullaris and cauda equina: Conus extends to the L1-2 level. Conus and cauda equina appear normal. Paraspinal and other soft tissues: Paraspinous soft tissues demonstrate no acute finding. Innumerable scattered cysts seen about the visualized kidneys, several of which are mildly complex in appearance. Additionally, there is a markedly heterogeneous enhancing solid-appearing mass arising from the interpolar right kidney measuring approximately 5.8 cm (series 55, image 11), concerning for a primary renal cell neoplasm. Few additional subcentimeter cystic lesions noted within the partially visualized liver, not well evaluated on this exam. Disc levels: L1-2: Negative  interspace. Mild bilateral facet hypertrophy. No canal or foraminal stenosis. L2-3: Negative interspace. Mild to moderate facet hypertrophy. No canal or foraminal stenosis. L3-4: Trace anterolisthesis. Mild disc bulge. Moderate facet hypertrophy. No spinal stenosis. Mild bilateral L3 foraminal narrowing. L4-5: 4 mm anterolisthesis. Mild disc bulge with disc desiccation. Moderate facet hypertrophy. No spinal stenosis. Mild right greater than left L4 foraminal narrowing. L5-S1: Trace anterolisthesis. Disc bulge with disc desiccation and reactive endplate spurring. Mild facet hypertrophy. No spinal stenosis. Mild bilateral L5 foraminal narrowing. IMPRESSION: 1. Widespread osseous metastatic disease throughout the lumbar spine and visualized pelvis. No significant extra osseous or epidural involvement. No pathologic fracture or other complication. 2. 5.8 cm mass arising from the interpolar right kidney, concerning for an additional possible primary renal cell neoplasm. Further evaluation with dedicated renal mass protocol CT and/or MRI recommended for further evaluation. 3. Mild for age degenerative spondylosis and facet hypertrophy throughout the lumbar spine without significant spinal stenosis. Associated mild bilateral L3 through L5 foraminal narrowing. Electronically Signed   By: Jeannine Boga M.D.   On: 09/13/2020 01:29   CT T-SPINE NO CHARGE  Result Date: 09/12/2020 CLINICAL DATA:  Fall W19.XXXA (ICD-10-CM) EXAM: CT THORACIC SPINE WITHOUT CONTRAST TECHNIQUE: Multidetector CT images of the thoracic were obtained using the standard protocol without intravenous contrast. COMPARISON:  None. FINDINGS: Alignment: No substantial sagittal subluxation. Vertebrae: Numerous sclerotic lesions throughout the thoracic spine, compatible with metastases. There likely lesions at every thoracic level lesions involving the vertebral bodies and posterior elements. The largest lesion is at T2 and is destructive with bony  erosive change of vertebral body and posterior elements. There is extensive epidural extension tumor into the canal and left foramen with associated canal and foraminal stenosis, potentially severe. There is height loss of the T3 vertebral body, compatible with pathologic fracture. Osteopenia. Paraspinal and other soft tissues: Please see concurrent CT chest/abdomen/pelvis for intrathoracic and intra-abdominal evaluation Disc levels: Epidural tumor at C2 level, described above. Mild multilevel thoracic degenerative change with anterior osteophytes. IMPRESSION: 1. Numerous metastases throughout the thoracic spine. The largest lesion is at T2 with extensive bony erosive change of the vertebral body and posterior elements and bulky epidural extension of tumor into the canal and left foramen at this level. Resulting canal and foraminal stenosis, likely severe. An MRI with contrast could further evaluate the extent of epidural tumor and the canal, cord and foramina if clinically indicated. 2. T2 vertebral body height loss, compatible with pathologic fracture. 3. Osteopenia. 4. Please see concurrent CT chest/abdomen/pelvis for intrathoracic and intra-abdominal evaluation Electronically Signed   By: Margaretha Sheffield M.D.   On: 09/12/2020 16:27   CT CHEST ABDOMEN PELVIS WO CONTRAST  Result Date: 09/12/2020 CLINICAL DATA:  80 year old female with history of blunt trauma after fall and weakness, thoracic back pain by report. EXAM: CT CHEST, ABDOMEN AND PELVIS WITHOUT CONTRAST TECHNIQUE: Multidetector CT imaging of  the chest, abdomen and pelvis was performed following the standard protocol without IV contrast. COMPARISON:  Thoracic spine evaluation of the same date. FINDINGS: CT CHEST FINDINGS Cardiovascular: Calcified atheromatous plaque of the thoracic aorta. No aneurysmal dilation. Central pulmonary vasculature is unremarkable. Heart size is normal without pericardial effusion. Three-vessel coronary artery disease.  Limited assessment of cardiovascular structures given lack of intravenous contrast. Mediastinum/Nodes: Thoracic inlet structures are unremarkable. No axillary lymphadenopathy. No mediastinal adenopathy. Fullness of the LEFT hilum with central pulmonary mass, see below. Esophagus grossly normal. Lungs/Pleura: LEFT upper lobe mass contiguous with LEFT hilum with spiculated morphologic features and distortion of the LEFT hilum, associated with some volume loss extending into the lingula. In the axial plane this Scattered pulmonary nodules throughout the chest, for instance on image measures approximately 3.2 x 2.0 cm. 24/4 there is a small 5 mm RIGHT middle lobe pulmonary nodule. Numerous other tiny nodules in the 4-5 mm range seen in the bilateral chest. No pleural effusion. Small amount of material layering in the RIGHT mainstem bronchus, airways otherwise unremarkable aside from findings described above. Musculoskeletal: Multiple sclerotic lesions throughout the spine, see dedicated spine evaluation for further detail. Additionally, there is destruction of the T2 thoracic vertebral body with soft tissue extending into the central canal narrowing the central canal at this level. Destruction of posterior T2 vertebral body, LEFT pedicle and with other findings better displayed in reported separately on dedicated thoracic spine CT. See below for full musculoskeletal details. CT ABDOMEN PELVIS FINDINGS Hepatobiliary: Scattered small low-density lesions in the liver incompletely characterized on noncontrast imaging some of which may represent cysts. No comparison imaging is available. No pericholecystic stranding. No gross biliary duct dilation. Pancreas: Signs of pancreatic atrophy. Spleen: Spleen is normal size without visible abnormality. Adrenals/Urinary Tract: Bilateral adrenal thickening displays low-density and maintains adreniform contours. This is more likely related to adrenal hyperplasia particularly on the  LEFT. Cysts in the bilateral kidneys. No hydronephrosis. No perinephric stranding. Urinary bladder with smooth contours. No adjacent stranding. RIGHT-sided renal lesion in the interpolar RIGHT kidney does not meet criteria for cyst and shows heterogeneity measuring 31 Hounsfield units, at 4.5 x 4.3 cm, incompletely characterized on noncontrast imaging. Fifty Hounsfield unit density, homogeneous lesion arising from the medial RIGHT kidney measures 2.5 x 2.0 cm arising from the upper posterior hilar lip. Stomach/Bowel: No acute gastrointestinal process. Vascular/Lymphatic: Calcified atheromatous plaque in the abdominal aorta without aneurysmal dilation. There is no gastrohepatic or hepatoduodenal ligament lymphadenopathy. No retroperitoneal or mesenteric lymphadenopathy. No pelvic sidewall lymphadenopathy. Reproductive: Post hysterectomy. Other: No ascites. No sign of free air. RIGHT inguinal hernia contains fat. Musculoskeletal: Numerous sclerotic foci, see thoracic spine imaging for destructive lesion at T2 which shows canal compromise. Sclerotic lesions also present in the lumbar spine mixed lytic and sclerotic focus at the L4 vertebral level. Sclerotic lesions also in the bony pelvis with destructive lesion in the LEFT greater trochanter showing early bony destruction and subtle sclerosis. Sclerotic focus on image 83 of series 2 measuring approximately cm in the LEFT iliac. Similar findings seen inferiorly on image 92 of series 2 with more subtle scattered areas of sclerosis elsewhere in the bony pelvis. Subtle area in the proximal femur seen on image 107 of series 2 may measure as much as 3.9 x 2.5 cm no sign of acute fracture of the bony pelvis. IMPRESSION: 1. 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 2. Marked central canal narrowing due to vertebral destruction at T2 better defined  on the dedicated CT of the thoracic spine. MRI of the thoracic spine may be  helpful as there is severe central canal narrowing associated with this finding. 3. Scattered pulmonary nodules suspicious for small pulmonary metastases in the current context. 4. Findings suspicious for solid RIGHT renal lesions also raising the question of solid renal neoplasms. Suggest follow-up ultrasound evaluation as an initial means of evaluating this finding if the patient cannot receive intravenous contrast. 5. RIGHT inguinal hernia. 6. Aortic atherosclerosis. Aortic Atherosclerosis (ICD10-I70.0). Electronically Signed   By: Zetta Bills M.D.   On: 09/12/2020 16:45     Medications   Scheduled Meds:  atorvastatin  80 mg Oral QHS   carvedilol  12.5 mg Oral BID WC   dexamethasone  2 mg Oral Daily   felodipine  10 mg Oral Daily   lidocaine  1 patch Transdermal Q24H   pantoprazole  40 mg Oral BID   sodium chloride flush  3 mL Intravenous Q12H   Continuous Infusions:  remdesivir 100 mg in NS 100 mL 100 mg (09/13/20 0927)       LOS: 0 days    Time spent: 30 minutes    Ezekiel Slocumb, DO [facility]  09/13/2020, 3:46 PM      If 7PM-7AM, please contact night-coverage. How to contact the [facility] Attending or Consulting provider [place] or covering provider during after hours [place], for this patient?    Check the care team in [facility] and look for a) attending/consulting TR[facility] provider listed and b) the [facility] team listed Log into [email or web address] and use [place]'s universal password to access. If you do not have the password, please contact the hospital operator. Locate the [facility] provider you are looking for under [facility] and page to a number that you can be directly reached. If you still have difficulty reaching the provider, please page the [facility] (Director on Call) for the Hospitalists listed on amion for assistance.

## 2020-09-13 NOTE — Plan of Care (Signed)
Notified by radiology of MRI thoracic spine results showing widespread metastatic osseous disease greatest at T2 resulting in severe spinal stenosis and impingement of the thoracic spinal cord with no visible cord signal changes.  There is extension into the left greater than right T1-2 and T2-3 neural foramina with severe stenosis on the left.    MRI lumbar spine also showing widespread osseous metastatic disease to the lumbar spine and visualized pelvis.  5.8 cm right interpolar kidney mass also noted concerning for primary renal cell neoplasm consistent with patient's known clear-cell renal carcinoma.  Will place neurosurgery consult for further evaluation and recommendations.  Zada Finders, MD [facility]

## 2020-09-13 NOTE — Evaluation (Signed)
Physical Therapy Evaluation Patient Details Name: Ariana Lee MRN: [id number] DOB: 03/05/1940 Today's Date: 09/13/2020   History of Present Illness  Pt admitted for weakness with repeated falls. 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 Nuero sx consult recs conservative treatment and PT cleared for mobility this date.  Clinical Impression  Pt is a pleasant 80 year old female who was admitted for weakness and repeated falls. Pt performs bed mobility with min assist and transfers with mod A. Pt demonstrates deficits with strength/balance/mobility. Pt reports no pain at this time and is eager to participate with therapy. Reports she hasn't been eating at home due to limited appetite, however ate majority of breakfast this AM. Would benefit from skilled PT to address above deficits and promote optimal return to PLOF; recommend transition to STR upon discharge from acute hospitalization.     Follow Up Recommendations SNF    Equipment Recommendations   (TBD)    Recommendations for Other Services       Precautions / Restrictions Precautions Precautions: Fall Restrictions Weight Bearing Restrictions: No      Mobility  Bed Mobility Overal bed mobility: Needs Assistance Bed Mobility: Supine to Sit;Sit to Supine     Supine to sit: Min assist Sit to supine: Min assist   General bed mobility comments: needs assist for B LE. Uses railing for assistance.    Transfers Overall transfer level: Needs assistance Equipment used: Rolling walker (2 wheeled) Transfers: Sit to/from Stand Sit to Stand: Mod assist         General transfer comment: multiple transfers with cues for correct hand placement. Once standing, slight buckling with L knee. Pre gait activities performed. Pt with quick fatigue. Able to perform lateral scoots up towards HOB.  Ambulation/Gait             General Gait Details: able to take 1 step forward/backward. Not  safe to further ambulate due to weakness  Stairs            Wheelchair Mobility    Modified Rankin (Stroke Patients Only)       Balance Overall balance assessment: Needs assistance;History of Falls Sitting-balance support: Feet supported;Bilateral upper extremity supported Sitting balance-Leahy Scale: Good     Standing balance support: Bilateral upper extremity supported Standing balance-Leahy Scale: Fair                               Pertinent Vitals/Pain Pain Assessment: No/denies pain    Home Living Family/patient expects to be discharged to:: Private residence Living Arrangements: Children (son and his GF) Available Help at Discharge: Family;Available 24 hours/day Type of Home: House Home Access: Stairs to enter Entrance Stairs-Rails: Can reach both Entrance Stairs-Number of Steps: 3 Home Layout: One level Home Equipment: Walker - 2 wheels;Walker - 4 wheels;Bedside commode (railing on bed)      Prior Function Level of Independence: Needs assistance         Comments: son's GF provides assist for all ADLs. Son provides transport. Pt able to ambulate short distances in home using RW, reports multiple falls     Hand Dominance        Extremity/Trunk Assessment   Upper Extremity Assessment Upper Extremity Assessment: Generalized weakness (B UE grossly 4/5)    Lower Extremity Assessment Lower Extremity Assessment: Generalized weakness (L LE grossly 3/5; R LE grossly 4/5)       Communication  Communication: No difficulties  Cognition Arousal/Alertness: Awake/alert Behavior During Therapy: WFL for tasks assessed/performed Overall Cognitive Status: Within Functional Limits for tasks assessed                                        General Comments      Exercises Other Exercises Other Exercises: attempted transfer to [facility]. Unable to weight shift adequately for safe transfer. Fatigue present. Returned to bed and RN  called for bed pan Other Exercises: supine ther-ex performed on B LE including AP, quad sets, SLRs, and hip abd/add. 10 reps with min assist for L and cga for R   Assessment/Plan    PT Assessment Patient needs continued PT services  PT Problem List Decreased strength;Decreased activity tolerance;Decreased balance;Decreased mobility       PT Treatment Interventions Gait training;Therapeutic exercise;Balance training    PT Goals (Current goals can be found in the Care Plan section)  Acute Rehab PT Goals Patient Stated Goal: to get stronger PT Goal Formulation: With patient Time For Goal Achievement: 09/27/20 Potential to Achieve Goals: Good    Frequency Min 2X/week   Barriers to discharge        Co-evaluation               AM-PAC PT "6 Clicks" Mobility  Outcome Measure Help needed turning from your back to your side while in a flat bed without using bedrails?: A Little Help needed moving from lying on your back to sitting on the side of a flat bed without using bedrails?: A Little Help needed moving to and from a bed to a chair (including a wheelchair)?: A Lot Help needed standing up from a chair using your arms (e.g., wheelchair or bedside chair)?: A Lot Help needed to walk in hospital room?: A Lot Help needed climbing 3-5 steps with a railing? : Total 6 Click Score: 13    End of Session Equipment Utilized During Treatment: Gait belt Activity Tolerance: Patient limited by fatigue Patient left: in bed;with bed alarm set (no bed pan in room-called RN station) Nurse Communication: Mobility status PT Visit Diagnosis: Unsteadiness on feet (R26.81);Repeated falls (R29.6);Muscle weakness (generalized) (M62.81);History of falling (Z91.81);Difficulty in walking, not elsewhere classified (R26.2)    Time: 0930-1010 PT Time Calculation (min) (ACUTE ONLY): 40 min   Charges:   PT Evaluation $PT Eval Low Complexity: 1 Low PT Treatments $Therapeutic Exercise: 8-22  mins $Therapeutic Activity: 8-22 mins        Greggory Stallion, PT, DPT [phone]   Ariana Lee 09/13/2020, 11:51 AM

## 2020-09-13 NOTE — Evaluation (Signed)
Occupational Therapy Evaluation Patient Details Name: Ariana Lee MRN: [id number] DOB: 1940/11/10 Today's Date: 09/13/2020    History of Present Illness Pt admitted for weakness with repeated falls. 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 Nuero sx consult recs conservative treatment and PT cleared for mobility this date.   Clinical Impression   Patient presenting with decreased Ind in self care, balance, functional mobility/transfer, endurance, and safety awareness. Patient reports ambulating short distances with RW, family assist with ADLs PTA. Patient currently functioning at mod - max A secondary to increased weakness and L knee buckling in standing.  Patient will benefit from acute OT to increase overall independence in the areas of ADLs, functional mobility, and safety awareness in order to safely discharge to next venue of care.     Follow Up Recommendations  SNF;Supervision - Intermittent    Equipment Recommendations  Other (comment) (defer to next venue of care)       Precautions / Restrictions Precautions Precautions: Fall      Mobility Bed Mobility Overal bed mobility: Needs Assistance Bed Mobility: Supine to Sit;Sit to Supine     Supine to sit: Min assist Sit to supine: Mod assist   General bed mobility comments: assist for B LEs and cuing for technique and hand placement    Transfers Overall transfer level: Needs assistance Equipment used: None Transfers: Sit to/from Stand Sit to Stand: Mod assist         General transfer comment: Pt standing with therapist with L knee blocked and standing for less than 1 minute before returning to bed secondary to fatigue.    Balance Overall balance assessment: Needs assistance;History of Falls Sitting-balance support: Feet supported;Bilateral upper extremity supported Sitting balance-Leahy Scale: Fair     Standing balance support: Bilateral upper extremity  supported Standing balance-Leahy Scale: Poor                             ADL either performed or assessed with clinical judgement   ADL Overall ADL's : Needs assistance/impaired     Grooming: Wash/dry hands;Wash/dry face;Sitting;Min guard               Lower Body Dressing: Total assistance                       Vision Patient Visual Report: No change from baseline              Pertinent Vitals/Pain Pain Assessment: No/denies pain     Hand Dominance Right   Extremity/Trunk Assessment Upper Extremity Assessment Upper Extremity Assessment: Generalized weakness   Lower Extremity Assessment Lower Extremity Assessment: Generalized weakness       Communication Communication Communication: No difficulties   Cognition Arousal/Alertness: Awake/alert Behavior During Therapy: WFL for tasks assessed/performed Overall Cognitive Status: Impaired/Different from baseline                                 General Comments: Pt oriented to self and location. She reports time as "[place]" and unsure of the date. Pt is very pleasant and cooperative.              Home Living Family/patient expects to be discharged to:: Private residence Living Arrangements: Children (son and his GF) Available Help at Discharge: Family;Available 24 hours/day Type of Home: House Home Access: Stairs to enter  Entrance Stairs-Number of Steps: 3 Entrance Stairs-Rails: Can reach both Home Layout: One level     Bathroom Shower/Tub: Tub/shower unit         Home Equipment: Environmental consultant - 2 wheels;Walker - 4 wheels;Bedside commode          Prior Functioning/Environment Level of Independence: Needs assistance        Comments: son's GF provides assist for all ADLs. Son provides transport. Pt able to ambulate short distances in home using RW, reports multiple falls        OT Problem List: Decreased strength;Decreased activity tolerance;Impaired balance (sitting  and/or standing);Decreased knowledge of use of DME or AE;Decreased safety awareness      OT Treatment/Interventions: Self-care/ADL training;Therapeutic exercise;Therapeutic activities;Cognitive remediation/compensation;Energy conservation;DME and/or AE instruction;Patient/family education;Balance training;Manual therapy    OT Goals(Current goals can be found in the care plan section) Acute Rehab OT Goals Patient Stated Goal: to get stronger OT Goal Formulation: With patient Time For Goal Achievement: 09/27/20 Potential to Achieve Goals: Fair ADL Goals Pt Will Perform Grooming: with supervision;standing Pt Will Perform Lower Body Dressing: with min assist Pt Will Transfer to Toilet: with min assist;bedside commode Pt Will Perform Toileting - Clothing Manipulation and hygiene: with min assist;sit to/from stand  OT Frequency: Min 2X/week   Barriers to D/C:    none known at this time          AM-PAC OT "6 Clicks" Daily Activity     Outcome Measure Help from another person eating meals?: None Help from another person taking care of personal grooming?: A Little Help from another person toileting, which includes using toliet, bedpan, or urinal?: Total Help from another person bathing (including washing, rinsing, drying)?: A Lot Help from another person to put on and taking off regular upper body clothing?: A Little Help from another person to put on and taking off regular lower body clothing?: Total 6 Click Score: 14   End of Session Nurse Communication: Mobility status  Activity Tolerance: Patient tolerated treatment well Patient left: in bed;with call bell/phone within reach;with bed alarm set  OT Visit Diagnosis: Unsteadiness on feet (R26.81);Muscle weakness (generalized) (M62.81)                Time: 0240-9735 OT Time Calculation (min): 22 min Charges:  OT General Charges $OT Visit: 1 Visit OT Evaluation $OT Eval Moderate Complexity: 1 Mod OT Treatments $Therapeutic  Activity: 8-22 mins  Darleen Crocker, MS, OTR/L , CBIS ascom [phone]  09/13/20, 4:36 PM

## 2020-09-14 DIAGNOSIS — R531 Weakness: Secondary | ICD-10-CM | POA: Diagnosis not present

## 2020-09-14 LAB — COMPREHENSIVE METABOLIC PANEL
ALT: 18 U/L (ref 0–44)
AST: 18 U/L (ref 15–41)
Albumin: 2.6 g/dL — ABNORMAL LOW (ref 3.5–5.0)
Alkaline Phosphatase: 268 U/L — ABNORMAL HIGH (ref 38–126)
Anion gap: 7 (ref 5–15)
BUN: 27 mg/dL — ABNORMAL HIGH (ref 8–23)
CO2: 24 mmol/L (ref 22–32)
Calcium: 8.6 mg/dL — ABNORMAL LOW (ref 8.9–10.3)
Chloride: 106 mmol/L (ref 98–111)
Creatinine, Ser: 0.87 mg/dL (ref 0.44–1.00)
GFR, Estimated: >60 mL/min (ref >60)
Glucose, Bld: 103 mg/dL — ABNORMAL HIGH (ref 70–99)
Potassium: 3.5 mmol/L (ref 3.5–5.1)
Sodium: 137 mmol/L (ref 135–145)
Total Bilirubin: 0.8 mg/dL (ref 0.3–1.2)
Total Protein: 5.2 g/dL — ABNORMAL LOW (ref 6.5–8.1)

## 2020-09-14 LAB — MAGNESIUM: Magnesium: 1.6 mg/dL — ABNORMAL LOW (ref 1.7–2.4)

## 2020-09-14 LAB — CBC
HCT: 34.5 % — ABNORMAL LOW (ref 36.0–46.0)
Hemoglobin: 11.4 g/dL — ABNORMAL LOW (ref 12.0–15.0)
MCH: 27.5 pg (ref 26.0–34.0)
MCHC: 33 g/dL (ref 30.0–36.0)
MCV: 83.3 fL (ref 80.0–100.0)
Platelets: 144 10*3/uL — ABNORMAL LOW (ref 150–400)
RBC: 4.14 MIL/uL (ref 3.87–5.11)
RDW: 16.8 % — ABNORMAL HIGH (ref 11.5–15.5)
WBC: 13.2 10*3/uL — ABNORMAL HIGH (ref 4.0–10.5)
nRBC: 0 % (ref 0.0–0.2)

## 2020-09-14 LAB — C-REACTIVE PROTEIN: CRP: 5.9 mg/dL — ABNORMAL HIGH (ref <1.0)

## 2020-09-14 MED ORDER — MAGNESIUM SULFATE 2 GM/50ML IV SOLN
2.0000 g | Freq: Once | INTRAVENOUS | Status: AC
  Administered 2020-09-14: 2 g via INTRAVENOUS
  Filled 2020-09-14: qty 50

## 2020-09-14 MED ORDER — METHOCARBAMOL 500 MG PO TABS: 500.0000 mg | ORAL_TABLET | Freq: Three times a day (TID) | ORAL | Status: DC | PRN

## 2020-09-14 MED ORDER — HYDRALAZINE HCL 25 MG PO TABS: 25.0000 mg | ORAL_TABLET | Freq: Three times a day (TID) | ORAL | Status: DC | PRN

## 2020-09-14 MED ORDER — LISINOPRIL 20 MG PO TABS
20.0000 mg | ORAL_TABLET | Freq: Every day | ORAL | Status: DC
  Administered 2020-09-14 – 2020-09-23 (×10): 20 mg via ORAL
  Filled 2020-09-14 (×10): qty 1

## 2020-09-14 NOTE — Progress Notes (Addendum)
PROGRESS NOTE    Ariana Lee   ERX:[id number]  DOB: 15-Jun-1940  PCP: Pcp, No    DOA: 09/12/2020 LOS: 1    Brief Narrative / Hospital Course to Date:   Per HPI on Admission: "Ariana Lee is a 80 y.o. 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  Patient has been on chemotherapy but was stopped recently due to worsening performance status.    Neurosurgery consulted, recommend consideration of palliative radiation therapy.  No surgical intervention indicated.  Assessment & Plan   Principal Problem:   Generalized weakness Active Problems:   CAD (coronary artery disease), native coronary artery   Essential hypertension   Hypercholesterolemia   PAD (peripheral artery disease) (HCC)   Acute kidney injury superimposed on CKD (HCC)   Metastatic primary lung cancer, left ([place])   COVID-19 virus infection   COVID-19 infection Acute on chronic generalized weakness Poor p.o. intake Continue remdesivir. Follow-up PT and OT recommendations Not hypoxic on admission, monitor O2 sats and supplement if needed to maintain sat above 90% Continue home oral Decadron 2 mg daily for appetite, nausea and fatigue  AKI superimposed on CKD stage IIIa -AKI present on admission and most likely due to dehydration, prerenal azotemia.  Treated with IV fluids overnight on admission.   825: AKI resolved --Resume lisinopril tomorrow --Monitor BMP  Stage IV left upper lobe adenocarcinoma of lung with osseous metastases -follows with oncology at [facility].  Previously on Keytruda,  currently on drug holiday due to poor performance status and progressively worsening over last few months.  CT imaging here shows widespread vertebral metastases with associated spinal canal and foraminal stenosis and resulting lower extremity weakness. 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  On review of 8/18 head CT no comment of subdural hematoma not reported. MRI brain here shows nothing acute, no intracranial metastatic disease, small bilateral subdural hygromas measuring up to 6 mm on the right and 2 mm on left with relatively mild mass-effect on the subjacent right cerebral hemisphere without significant midline shift.  Hypokalemia / Hypomagnesemia -resolved with replacement.  Monitor replace as needed  Right upper back and shoulder pain -suspect due to her recent falls.  On exam right trapezius and cervical paraspinal muscles are hypertonic and mildly tender. -- Trial of as needed Robaxin -- Ice or heat packs okay -- As needed Tylenol  COPD -stable without acute exacerbation.   Continue as needed albuterol.  Hypertension -continued on Coreg.   Lisinopril held on admission due to AKI. Resume lisinopril tomorrow As needed oral hydralazine  CAD -with mild but flat troponin elevation but no chest pain or acute ischemic EKG changes.  Likely demand ischemia in the setting of dehydration and COVID-19 infection.  Continue atorvastatin.  Aspirin on hold  Right renal cell carcinoma -seen on CT imaging.  No acute issues.  Monitor  renal function  Obesity: Body mass index is 33.28 kg/m.  Complicates overall  care and prognosis.  Recommend lifestyle modifications including physical activity and diet for weight loss and overall long-term health.   DVT prophylaxis: SCDs Start: 09/12/20 1940   Diet:  Diet Orders (From admission, onward)     Start     Ordered   09/12/20 1939  Diet regular Room service appropriate? Yes; Fluid consistency: Thin  Diet effective now       Question Answer Comment  Room service appropriate? Yes   Fluid consistency: Thin      09/12/20 1938              Code Status: Full Code   Subjective 09/14/20    Patient awake sitting up in bed when seen this morning.  She reports soreness in her right upper back and shoulder that she thinks is from when she fell.  Says sleeping well, appetite fair.  She denies any other acute complaints.   Disposition Plan & Communication   Status is: Inpatient  Remains inpatient appropriate because:IV treatments appropriate due to intensity of illness or inability to take PO.  Anticipate need for SNF placement and completing isolation prior to DC.  Dispo: The patient is from: Home              Anticipated d/c is to: SNF              Patient currently is not medically stable to d/c.   Difficult to place patient No    Consults, Procedures, Significant Events   Consultants:  Palliative care Neurosurgery  Procedures:  None  Antimicrobials:  Anti-infectives (From admission, onward)    Start     Dose/Rate Route Frequency Ordered Stop   09/13/20 1000  remdesivir 100 mg in sodium chloride 0.9 % 100 mL IVPB  Status:  Discontinued       See Hyperspace for full Linked Orders Report.   100 mg 200 mL/hr over 30 Minutes Intravenous Daily 09/12/20 1943 09/12/20 1959   09/13/20 1000  remdesivir 100 mg in sodium chloride 0.9 % 100 mL IVPB       See Hyperspace for full Linked Orders Report.   100 mg 200 mL/hr over 30 Minutes Intravenous Daily 09/12/20 1959 09/17/20 0959   09/12/20 2200  remdesivir 200 mg in sodium chloride 0.9% 250  mL IVPB       See Hyperspace for full Linked Orders Report.   200 mg 580 mL/hr over 30 Minutes Intravenous  Once 09/12/20 1959 09/13/20 0053   09/12/20 1945  remdesivir 200 mg in sodium chloride 0.9% 250 mL IVPB  Status:  Discontinued       See Hyperspace for full Linked Orders Report.   200 mg 580 mL/hr over 30 Minutes Intravenous Once 09/12/20 1943 09/12/20 1959         Micro    Objective   Vitals:   09/13/20 1735 09/13/20 2106 09/14/20 0428 09/14/20 0801  BP: (!) 144/66 132/61 (!) 142/65 (!) 152/68  Pulse: 88 78 66 71  Resp: 16 18 17    Temp:  98.6 F (37 C) 98.4 F (36.9 C) 98.2 F (36.8 C)  TempSrc:  Oral Oral Oral  SpO2: 100% 98% 100% 98%  Weight:      Height:        Intake/Output Summary (Last 24 hours) at 09/14/2020 1551 Last data filed at 09/14/2020 1478 Gross per 24 hour  Intake 155 ml  Output 1250 ml  Net -1095 ml   Filed Weights   09/12/20 1055  Weight: 90.7 kg    Physical Exam:  General exam: awake, alert, no acute distress, obese Respiratory system: CTAB, normal respiratory effort, on room air. Cardiovascular system: normal S1/S2, RRR, no peripheral edema   Gastrointestinal system: soft, nontender abdomen Central nervous system: A&O x3. no gross focal neurologic deficits, normal speech Musculoskeletal: Hypertonic right upper trapezius and cervical paraspinal muscles Psychiatry: normal mood, congruent affect, judgement and insight appear normal  Labs   Data Reviewed: I have personally reviewed following labs and imaging studies  CBC: Recent Labs  Lab 09/12/20 1054 09/13/20 1018 09/14/20 0752  WBC 16.7* 14.0* 13.2*  NEUTROABS  --  10.8*  --   HGB 13.7 11.9* 11.4*  HCT 42.6 36.9 34.5*  MCV 85.7 86.4 83.3  PLT 153 132* 219*   Basic Metabolic Panel: Recent Labs  Lab 09/12/20 1054 09/13/20 1018 09/14/20 0752  NA 140 141 137  K 3.4* 3.7 3.5  CL 103 104 106  CO2 20* 23 24  GLUCOSE 97 133* 103*  BUN 20 22 27*  CREATININE 1.33* 1.20*  0.87  CALCIUM 9.3 8.8* 8.6*  MG  --  1.7 1.6*   GFR: Estimated Creatinine Clearance: 56.4 mL/min (by C-G formula based on SCr of 0.87 mg/dL). Liver Function Tests: Recent Labs  Lab 09/12/20 1350 09/13/20 1018 09/14/20 0752  AST 26 31 18   ALT 21 19 18   ALKPHOS 192* 281* 268*  BILITOT 1.0 0.9 0.8  PROT 6.2* 5.5* 5.2*  ALBUMIN 3.1* 2.6* 2.6*   No results for input(s): LIPASE, AMYLASE in the last 168 hours. No results for input(s): AMMONIA in the last 168 hours. Coagulation Profile: No results for input(s): INR, PROTIME in the last 168 hours. Cardiac Enzymes: Recent Labs  Lab 09/12/20 1350  CKTOTAL 115   BNP (last 3 results) No results for input(s): PROBNP in the last 8760 hours. HbA1C: No results for input(s): HGBA1C in the last 72 hours. CBG: Recent Labs  Lab 09/12/20 1513  GLUCAP 105*   Lipid Profile: No results for input(s): CHOL, HDL, LDLCALC, TRIG, CHOLHDL, LDLDIRECT in the last 72 hours. Thyroid Function Tests: No results for input(s): TSH, T4TOTAL, FREET4, T3FREE, THYROIDAB in the last 72 hours. Anemia Panel: No results for input(s): VITAMINB12, FOLATE, FERRITIN, TIBC, IRON, RETICCTPCT in the last 72 hours. Sepsis Labs: Recent Labs  Lab 09/12/20 1802  PROCALCITON 0.56    Recent Results (from the past 240 hour(s))  Resp Panel by RT-PCR (Flu A&B, Covid) Nasopharyngeal Swab     Status: Abnormal   Collection Time: 09/12/20  3:12 PM   Specimen: Nasopharyngeal Swab; Nasopharyngeal(NP) swabs in vial transport medium  Result Value Ref Range Status   SARS Coronavirus 2 by RT PCR POSITIVE (A) NEGATIVE Final    Comment: RESULT CALLED TO, READ BACK BY AND VERIFIED WITH: KATIE RAND AT 7588 ON 09/12/20 BY SS (NOTE) SARS-CoV-2 target nucleic acids are DETECTED.  The SARS-CoV-2 RNA is generally detectable in upper respiratory specimens during the acute phase of infection. Positive results are indicative of the presence of the identified virus, but do not rule out  bacterial infection or co-infection with other pathogens not detected by the test. Clinical correlation with patient history and other diagnostic information is necessary to determine patient infection status. The expected result is Negative.  Fact Sheet for Patients: EntrepreneurPulse.com.au  Fact Sheet for Healthcare Providers: IncredibleEmployment.be  This test is not yet approved or cleared by the [place]  FDA and  has been authorized for detection and/or diagnosis of SARS-CoV-2 by FDA under an Emergency Use Authorization (EUA).  This EUA will remain in effect (meaning this test can  be used) for the duration of  the COVID-19 declaration under Section 564(b)(1) of the Act, 21 U.S.C. section 360bbb-3(b)(1), unless the authorization is terminated or revoked sooner.     Influenza A by PCR NEGATIVE NEGATIVE Final   Influenza B by PCR NEGATIVE NEGATIVE Final    Comment: (NOTE) The Xpert Xpress SARS-CoV-2/FLU/RSV plus assay is intended as an aid in the diagnosis of influenza from Nasopharyngeal swab specimens and should not be used as a sole basis for treatment. Nasal washings and aspirates are unacceptable for Xpert Xpress SARS-CoV-2/FLU/RSV testing.  Fact Sheet for Patients: EntrepreneurPulse.com.au  Fact Sheet for Healthcare Providers: IncredibleEmployment.be  This test is not yet approved or cleared by the [place] FDA and has been authorized for detection and/or diagnosis of SARS-CoV-2 by FDA under an Emergency Use Authorization (EUA). This EUA will remain in effect (meaning this test can be used) for the duration of the COVID-19 declaration under Section 564(b)(1) of the Act, 21 U.S.C. section 360bbb-3(b)(1), unless the authorization is terminated or revoked.  Performed at [facility], [place], [place] [address]       Imaging Studies   CT HEAD WO CONTRAST  (5MM)  Result Date: 09/12/2020 CLINICAL DATA:  Head trauma, minor (Age >= 65y) EXAM: CT HEAD WITHOUT CONTRAST TECHNIQUE: Contiguous axial images were obtained from the base of the skull through the vertex without intravenous contrast. COMPARISON:  None. FINDINGS: Brain: Approximately 6 mm low-density right cerebral convexity subdural collection. Mild mass effect on the adjacent frontal lobe without significant midline shift. No definite hyperdense component. No evidence of acute large vascular territory infarct. Remote appearing small infarcts the right basal ganglia, extending overlying corona radiata. Additional patchy white matter hypoattenuation, likely the sequela of chronic microvascular ischemic disease. Mild-to-moderate atrophy with ex vacuo ventricular dilation. No hydrocephalus. No evidence of a mass lesion. Partially empty and expanded sella. Vascular: No hyperdense vessel identified. Calcific intracranial atherosclerosis. Skull: No acute fracture. Sinuses/Orbits: Clear visualized sinuses no acute orbital findings. Other: No mastoid effusions. IMPRESSION: 1. 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 Mild mass effect on the adjacent frontal lobe without significant midline shift. 2. Small remote appearing infarct in the right basal ganglia, mild to moderate chronic microvascular ischemic disease and atrophy. 3. An MRI with contrast could provide more sensitive evaluation for metastatic disease if clinically indicated. Electronically Signed   By: Margaretha Sheffield M.D.   On: 09/12/2020 16:35   CT Cervical Spine Wo Contrast  Result Date: 09/12/2020 CLINICAL DATA:  Neck trauma (Age >= 65y) EXAM: CT CERVICAL SPINE WITHOUT CONTRAST TECHNIQUE: Multidetector CT imaging of the cervical spine was performed without intravenous contrast. Multiplanar CT image reconstructions were also generated. COMPARISON:  None.  FINDINGS: Alignment: Straightening of the normal cervical lordosis. No substantial sagittal subluxation. Skull base and vertebrae: No convincing evidence of acute fracture cervical spine. Mild height loss of multiple lower cervical vertebral bodies, favored to represent degenerative remodeling given no associated lucency, sclerosis or surrounding paraspinal edema. Small sclerotic lesions in the posterior elements at C7, suggestive metastatic given findings in the thoracic spine. Soft tissues and spinal canal: No prevertebral fluid or swelling. No visible canal hematoma. See CT of the thoracic spine for evaluation of canal findings at C2.  Disc levels: Mild-to-moderate multilevel degenerative disc disease and facet arthropathy. Upper chest: Evaluated on concurrent CT chest/abdomen/pelvis. Other: Calcific atherosclerosis of carotid arteries. IMPRESSION: 1. No convincing evidence of acute fracture or traumatic malalignment cervical spine. 2. Probable small sclerotic metastases in the posterior elements at C7. An MRI of the cervical spine with contrast provide more sensitive evaluation if clinically indicated. 3. Please see CT of thoracic spine for description of extensive metastatic disease in the thoracic spine, including epidural extension of tumor. Electronically Signed   By: Margaretha Sheffield M.D.   On: 09/12/2020 16:45   MR Brain W and Wo Contrast  Result Date: 09/13/2020 CLINICAL DATA:  Initial evaluation for brain mass or lesion. Previous studies concerning for pulmonary neoplasm with metastatic disease. EXAM: MRI HEAD WITHOUT AND WITH CONTRAST TECHNIQUE: Multiplanar, multiecho pulse sequences of the brain and surrounding structures were obtained without and with intravenous contrast. CONTRAST:  82mL GADAVIST GADOBUTROL 1 MMOL/ML IV SOLN COMPARISON:  Prior CT from earlier the same day. FINDINGS: Brain: Examination mildly degraded by motion artifact. Generalized age-related cerebral atrophy. Patchy T2/FLAIR  hyperintensity involving the periventricular, deep, and subcortical white matter both cerebral hemispheres most consistent with chronic small vessel ischemic disease, moderate nature. Superimposed remote lacunar infarct at the right lentiform nucleus. No foci of diffusion abnormality to suggest acute or subacute ischemia. Gray-white matter differentiation maintained. No encephalomalacia to suggest chronic cortical infarction. No acute intracranial hemorrhage. Single subcentimeter focus of susceptibility artifact at the anterior right frontal corona radiata noted (series 15, image 31), likely a small chronic microhemorrhage. No acute blood products seen at this location on prior CT. No mass lesion. No hydrocephalus. Small T2 hyperintense extra-axial collections seen overlying the bilateral cerebral convexities, consistent with hygromas. These measure up to 6 mm on the right and 2 mm on the left. Relatively mild mass effect on the subjacent right cerebral hemisphere without significant midline shift. Pituitary gland and suprasellar region normal. Midline structures intact. No abnormal enhancement. Vascular: Major intracranial vascular flow voids are grossly maintained at the skull base. Skull and upper cervical spine: Craniocervical junction within normal limits. Bone marrow signal intensity normal. No visible focal molar placing lesion. No scalp soft tissue abnormality. Sinuses/Orbits: Patient status post bilateral ocular lens replacement. Globes and orbital soft tissues demonstrate no acute finding. Paranasal sinuses are largely clear. No mastoid effusion. Inner ear structures grossly normal. Other: None. IMPRESSION: 1. No acute intracranial abnormality. No evidence for intracranial metastatic disease. 2. Small bilateral subdural hygromas measuring up to 6 mm on the right and 2 mm on the left. Relatively mild mass effect on the subjacent right cerebral hemisphere without significant midline shift. 3. Underlying  age-related cerebral atrophy with moderate chronic microvascular ischemic disease. Small remote lacunar infarct at the right lentiform nucleus. Electronically Signed   By: Jeannine Boga M.D.   On: 09/13/2020 00:12   MR THORACIC SPINE W WO CONTRAST  Addendum Date: 09/13/2020   ADDENDUM REPORT: 09/13/2020 01:31 ADDENDUM: Results communicated by telephone on 09/13/2020 at 1:17 am to the hospitalist taking care of the patient, Dr Zada Finders. Electronically Signed   By: Jeannine Boga M.D.   On: 09/13/2020 01:31   Result Date: 09/13/2020 CLINICAL DATA:  Initial evaluation for metastatic lung carcinoma. EXAM: MRI THORACIC WITHOUT AND WITH CONTRAST TECHNIQUE: Multiplanar and multiecho pulse sequences of the thoracic spine were obtained without and with intravenous contrast. CONTRAST:  47mL GADAVIST GADOBUTROL 1 MMOL/ML IV SOLN COMPARISON:  Prior CT from earlier the same day. FINDINGS: Alignment:  Trace anterolisthesis of C7 on T1. Alignment otherwise normal throughout the thoracic spine with preservation of the normal thoracic kyphosis. Vertebrae: Multiple metastatic lesions seen throughout the thoracic spine, involving nearly all levels. Involvement is greatest at the level of T2 where the T2 vertebral body is essentially completely replaced by tumor. Associated height loss of up to 20%, consistent with associated pathologic fracture. Extensive extra osseous and epidural extension with tumor seen extending into the ventral and left greater than right epidural space (series 40, image 6). Secondary severe spinal stenosis with mass effect on the adjacent cord, which is flattened and slightly deviated posteriorly and to the right. Thecal sac measures approximately 5 mm in transverse diameter at its most narrow point. No visible cord signal changes. Tumor also extends into the left greater than right T1-2 and T2-3 neural foramina, with resultant severe stenosis on the left, and more moderate narrowing on the  right. Otherwise, no other significant epidural or extraosseous tumor seen elsewhere within the thoracic spine. No other definite pathologic fracture at this time. Cord: Severe spinal stenosis with impingement of the thoracic spinal cord at the level of T2 as above. No definite cord signal changes. Elsewhere, signal intensity within the thoracic spinal cord is otherwise within normal limits. No other significant epidural or intracanalicular tumor. No other abnormal enhancement. Paraspinal and other soft tissues: Paraspinous soft tissues demonstrate no other acute finding. Multiple prominent T2 hyperintense cyst noted about the visualized kidneys. Disc levels: Mild lower cervical spondylosis noted without high-grade stenosis. Normal for age multilevel disc desiccation seen throughout the thoracic spine. No other significant disc bulge or focal disc herniation. No other spinal stenosis. Posterior element hypertrophy at T11-12 with no more than mild foraminal narrowing. Foramina otherwise widely patent. IMPRESSION: 1. Widespread metastatic osseous disease throughout the thoracic spine, with greatest involvement at the level of T2 where there is associated pathologic fracture with up to 20% height loss. Prominent epidural extension at this level with resultant severe spinal stenosis and impingement of the thoracic spinal cord, but no visible cord signal changes. Associated extension into the left greater than right T1-2 and T2-3 neural foramina with severe stenosis on the left. Neuro surgical consultation recommended. 2. Additional scattered osseous metastatic disease elsewhere throughout the thoracic spine, with no other significant extra osseous or epidural tumor at this time. No other significant stenosis. Current attempt is being made to contact the ordering clinician regarding these findings. Resultant well be conveyed as soon as possible. Electronically Signed: By: Jeannine Boga M.D. On: 09/13/2020 01:15    MR Lumbar Spine W Wo Contrast  Result Date: 09/13/2020 CLINICAL DATA:  Initial evaluation for metastatic lung carcinoma, back pain, recent fall. EXAM: MRI LUMBAR SPINE WITHOUT AND WITH CONTRAST TECHNIQUE: Multiplanar and multiecho pulse sequences of the lumbar spine were obtained without and with intravenous contrast. CONTRAST:  57mL GADAVIST GADOBUTROL 1 MMOL/ML IV SOLN COMPARISON:  Prior CT from earlier the same day. FINDINGS: Segmentation: Standard. Lowest well-formed disc space labeled the L5-S1 level. Alignment: Trace anterolisthesis of L3 on L4, with 4 mm anterolisthesis of L4 on L5, and 3 mm anterolisthesis of L5 on S1. Findings chronic and facet mediated. Vertebrae: Multiple scattered osseous metastases seen throughout the lumbar spine as well as the visualized sacrum and pelvis. Involvement of essentially all levels. For reference purposes, the largest lesion is positioned at L4 and measures approximately 2.8 cm. No significant extra osseous or epidural extension is seen. No visible associated pathologic fracture or other complication. Conus medullaris  and cauda equina: Conus extends to the L1-2 level. Conus and cauda equina appear normal. Paraspinal and other soft tissues: Paraspinous soft tissues demonstrate no acute finding. Innumerable scattered cysts seen about the visualized kidneys, several of which are mildly complex in appearance. Additionally, there is a markedly heterogeneous enhancing solid-appearing mass arising from the interpolar right kidney measuring approximately 5.8 cm (series 55, image 11), concerning for a primary renal cell neoplasm. Few additional subcentimeter cystic lesions noted within the partially visualized liver, not well evaluated on this exam. Disc levels: L1-2: Negative interspace. Mild bilateral facet hypertrophy. No canal or foraminal stenosis. L2-3: Negative interspace. Mild to moderate facet hypertrophy. No canal or foraminal stenosis. L3-4: Trace anterolisthesis.  Mild disc bulge. Moderate facet hypertrophy. No spinal stenosis. Mild bilateral L3 foraminal narrowing. L4-5: 4 mm anterolisthesis. Mild disc bulge with disc desiccation. Moderate facet hypertrophy. No spinal stenosis. Mild right greater than left L4 foraminal narrowing. L5-S1: Trace anterolisthesis. Disc bulge with disc desiccation and reactive endplate spurring. Mild facet hypertrophy. No spinal stenosis. Mild bilateral L5 foraminal narrowing. IMPRESSION: 1. Widespread osseous metastatic disease throughout the lumbar spine and visualized pelvis. No significant extra osseous or epidural involvement. No pathologic fracture or other complication. 2. 5.8 cm mass arising from the interpolar right kidney, concerning for an additional possible primary renal cell neoplasm. Further evaluation with dedicated renal mass protocol CT and/or MRI recommended for further evaluation. 3. Mild for age degenerative spondylosis and facet hypertrophy throughout the lumbar spine without significant spinal stenosis. Associated mild bilateral L3 through L5 foraminal narrowing. Electronically Signed   By: Jeannine Boga M.D.   On: 09/13/2020 01:29   CT T-SPINE NO CHARGE  Result Date: 09/12/2020 CLINICAL DATA:  Fall W19.XXXA (ICD-10-CM) EXAM: CT THORACIC SPINE WITHOUT CONTRAST TECHNIQUE: Multidetector CT images of the thoracic were obtained using the standard protocol without intravenous contrast. COMPARISON:  None. FINDINGS: Alignment: No substantial sagittal subluxation. Vertebrae: Numerous sclerotic lesions throughout the thoracic spine, compatible with metastases. There likely lesions at every thoracic level lesions involving the vertebral bodies and posterior elements. The largest lesion is at T2 and is destructive with bony erosive change of vertebral body and posterior elements. There is extensive epidural extension tumor into the canal and left foramen with associated canal and foraminal stenosis, potentially severe. There  is height loss of the T3 vertebral body, compatible with pathologic fracture. Osteopenia. Paraspinal and other soft tissues: Please see concurrent CT chest/abdomen/pelvis for intrathoracic and intra-abdominal evaluation Disc levels: Epidural tumor at C2 level, described above. Mild multilevel thoracic degenerative change with anterior osteophytes. IMPRESSION: 1. Numerous metastases throughout the thoracic spine. The largest lesion is at T2 with extensive bony erosive change of the vertebral body and posterior elements and bulky epidural extension of tumor into the canal and left foramen at this level. Resulting canal and foraminal stenosis, likely severe. An MRI with contrast could further evaluate the extent of epidural tumor and the canal, cord and foramina if clinically indicated. 2. T2 vertebral body height loss, compatible with pathologic fracture. 3. Osteopenia. 4. Please see concurrent CT chest/abdomen/pelvis for intrathoracic and intra-abdominal evaluation Electronically Signed   By: Margaretha Sheffield M.D.   On: 09/12/2020 16:27   CT CHEST ABDOMEN PELVIS WO CONTRAST  Result Date: 09/12/2020 CLINICAL DATA:  80 year old female with history of blunt trauma after fall and weakness, thoracic back pain by report. EXAM: CT CHEST, ABDOMEN AND PELVIS WITHOUT CONTRAST TECHNIQUE: Multidetector CT imaging of the chest, abdomen and pelvis was performed following the standard protocol  without IV contrast. COMPARISON:  Thoracic spine evaluation of the same date. FINDINGS: CT CHEST FINDINGS Cardiovascular: Calcified atheromatous plaque of the thoracic aorta. No aneurysmal dilation. Central pulmonary vasculature is unremarkable. Heart size is normal without pericardial effusion. Three-vessel coronary artery disease. Limited assessment of cardiovascular structures given lack of intravenous contrast. Mediastinum/Nodes: Thoracic inlet structures are unremarkable. No axillary lymphadenopathy. No mediastinal adenopathy.  Fullness of the LEFT hilum with central pulmonary mass, see below. Esophagus grossly normal. Lungs/Pleura: LEFT upper lobe mass contiguous with LEFT hilum with spiculated morphologic features and distortion of the LEFT hilum, associated with some volume loss extending into the lingula. In the axial plane this Scattered pulmonary nodules throughout the chest, for instance on image measures approximately 3.2 x 2.0 cm. 24/4 there is a small 5 mm RIGHT middle lobe pulmonary nodule. Numerous other tiny nodules in the 4-5 mm range seen in the bilateral chest. No pleural effusion. Small amount of material layering in the RIGHT mainstem bronchus, airways otherwise unremarkable aside from findings described above. Musculoskeletal: Multiple sclerotic lesions throughout the spine, see dedicated spine evaluation for further detail. Additionally, there is destruction of the T2 thoracic vertebral body with soft tissue extending into the central canal narrowing the central canal at this level. Destruction of posterior T2 vertebral body, LEFT pedicle and with other findings better displayed in reported separately on dedicated thoracic spine CT. See below for full musculoskeletal details. CT ABDOMEN PELVIS FINDINGS Hepatobiliary: Scattered small low-density lesions in the liver incompletely characterized on noncontrast imaging some of which may represent cysts. No comparison imaging is available. No pericholecystic stranding. No gross biliary duct dilation. Pancreas: Signs of pancreatic atrophy. Spleen: Spleen is normal size without visible abnormality. Adrenals/Urinary Tract: Bilateral adrenal thickening displays low-density and maintains adreniform contours. This is more likely related to adrenal hyperplasia particularly on the LEFT. Cysts in the bilateral kidneys. No hydronephrosis. No perinephric stranding. Urinary bladder with smooth contours. No adjacent stranding. RIGHT-sided renal lesion in the interpolar RIGHT kidney does  not meet criteria for cyst and shows heterogeneity measuring 31 Hounsfield units, at 4.5 x 4.3 cm, incompletely characterized on noncontrast imaging. Fifty Hounsfield unit density, homogeneous lesion arising from the medial RIGHT kidney measures 2.5 x 2.0 cm arising from the upper posterior hilar lip. Stomach/Bowel: No acute gastrointestinal process. Vascular/Lymphatic: Calcified atheromatous plaque in the abdominal aorta without aneurysmal dilation. There is no gastrohepatic or hepatoduodenal ligament lymphadenopathy. No retroperitoneal or mesenteric lymphadenopathy. No pelvic sidewall lymphadenopathy. Reproductive: Post hysterectomy. Other: No ascites. No sign of free air. RIGHT inguinal hernia contains fat. Musculoskeletal: Numerous sclerotic foci, see thoracic spine imaging for destructive lesion at T2 which shows canal compromise. Sclerotic lesions also present in the lumbar spine mixed lytic and sclerotic focus at the L4 vertebral level. Sclerotic lesions also in the bony pelvis with destructive lesion in the LEFT greater trochanter showing early bony destruction and subtle sclerosis. Sclerotic focus on image 83 of series 2 measuring approximately cm in the LEFT iliac. Similar findings seen inferiorly on image 92 of series 2 with more subtle scattered areas of sclerosis elsewhere in the bony pelvis. Subtle area in the proximal femur seen on image 107 of series 2 may measure as much as 3.9 x 2.5 cm no sign of acute fracture of the bony pelvis. IMPRESSION: 1. 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 2. 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 MRI of the  thoracic spine may be helpful as there is severe central canal narrowing associated with this finding. 3. Scattered pulmonary nodules suspicious for small pulmonary metastases in the current context. 4. Findings suspicious for  solid RIGHT renal lesions also raising the question of solid renal neoplasms. Suggest follow-up ultrasound evaluation as an initial means of evaluating this finding if the patient cannot receive intravenous contrast. 5. RIGHT inguinal hernia. 6. Aortic atherosclerosis. Aortic Atherosclerosis (ICD10-I70.0). Electronically Signed   By: Zetta Bills M.D.   On: 09/12/2020 16:45     Medications   Scheduled Meds:  atorvastatin  80 mg Oral QHS   carvedilol  12.5 mg Oral BID WC   dexamethasone  2 mg Oral Daily   felodipine  10 mg Oral Daily   lidocaine  1 patch Transdermal Q24H   pantoprazole  40 mg Oral BID   sodium chloride flush  3 mL Intravenous Q12H   Continuous Infusions:  magnesium sulfate bolus IVPB 2 g (09/14/20 1533)   remdesivir 100 mg in NS 100 mL 100 mg (09/14/20 1025)       LOS: 1 day    Time spent: 30 minutes with > 50% spent at bedside and in coordination of care     Ariana Slocumb, DO [facility]  09/14/2020, 3:51 PM      If 7PM-7AM, please contact night-coverage. How to contact the [facility] Attending or Consulting provider [place] or covering provider during after hours [place], for this patient?    Check the care team in [facility] and look for a) attending/consulting TR[facility] provider listed and b) the [facility] team listed Log into [email or web address] and use [place]'s universal password to access. If you do not have the password, please contact the hospital operator. Locate the [facility] provider you are looking for under [facility] and page to a number that you can be directly reached. If you still have difficulty reaching the provider, please page the [facility] (Director on Call) for the Hospitalists listed on amion for assistance.

## 2020-09-14 NOTE — Care Management Important Message (Signed)
Important Message  Patient Details  Name: Ariana Lee MRN: [id number] Date of Birth: 06-02-1940   Medicare Important Message Given:  Yes  Initial Medicare IM reviewed with Lydia Guiles, son at [phone] by Thornton Dales, Patient Access Associate on 09/14/2020 at 10:13am.   Dannette Barbara 09/14/2020, 2:37 PM

## 2020-09-14 NOTE — Progress Notes (Signed)
Occupational Therapy Treatment Patient Details Name: Ariana Lee MRN: [id number] DOB: 12/31/1940 Today's Date: 09/14/2020    History of present illness Pt admitted for weakness with repeated falls. 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 Nuero sx consult recs conservative treatment and PT cleared for mobility this date.   OT comments  Upon entering the room, pt supine in bed and agreeable to OT intervention. Pt requesting to brush teeth. Min A to get to EOB. Pt supporting self with B LEs and UEs engaged in task on table top to clean dentures. Pt needing min - mod A for posterior bias with pt reporting feeling increased weakness. Pt returning to supine with mod A secondary to fatigue. Pt does have one run of Vtach but HR returns to normal once returning to bed. Pt engaged in B UE strengthening exercises with towel as dowel rod. Pt performs 10 reps bicep curls, forward rows, and chest press with RR increasing to 30's and needing rest breaks. Pt remains in bed at end of session with all needs within reach.   Follow Up Recommendations  SNF;Supervision - Intermittent    Equipment Recommendations  Other (comment) (defer to next venue of care)       Precautions / Restrictions Precautions Precautions: Fall       Mobility Bed Mobility Overal bed mobility: Needs Assistance Bed Mobility: Supine to Sit;Sit to Supine     Supine to sit: Min assist Sit to supine: Mod assist   General bed mobility comments: assist for B LEs and cuing for technique and hand placement                  Balance Overall balance assessment: Needs assistance;History of Falls Sitting-balance support: Feet supported;Bilateral upper extremity supported Sitting balance-Leahy Scale: Poor Sitting balance - Comments: min - mod A Postural control: Posterior lean                                 ADL either performed or assessed with clinical judgement    ADL Overall ADL's : Needs assistance/impaired     Grooming: Oral care;Sitting Grooming Details (indicate cue type and reason): set up A to brush teeth but needing min - mod A for static sitting balance                                     Vision Patient Visual Report: No change from baseline            Cognition Arousal/Alertness: Awake/alert Behavior During Therapy: WFL for tasks assessed/performed Overall Cognitive Status: Impaired/Different from baseline                                 General Comments: Pt is pleasant and cooperative but remains confused and needing min cuing for sequencing.                   Pertinent Vitals/ Pain       Pain Assessment: No/denies pain         Frequency  Min 2X/week        Progress Toward Goals  OT Goals(current goals can now be found in the care plan section)  Progress towards OT goals: Progressing toward goals  Acute Rehab OT  Goals Patient Stated Goal: to get stronger OT Goal Formulation: With patient Time For Goal Achievement: 09/27/20 Potential to Achieve Goals: [place] Discharge plan remains appropriate;Frequency remains appropriate       AM-PAC OT "6 Clicks" Daily Activity     Outcome Measure   Help from another person eating meals?: None Help from another person taking care of personal grooming?: A Little Help from another person toileting, which includes using toliet, bedpan, or urinal?: Total Help from another person bathing (including washing, rinsing, drying)?: A Lot Help from another person to put on and taking off regular upper body clothing?: A Little Help from another person to put on and taking off regular lower body clothing?: Total 6 Click Score: 14    End of Session    OT Visit Diagnosis: Unsteadiness on feet (R26.81);Muscle weakness (generalized) (M62.81)   Activity Tolerance Patient tolerated treatment well   Patient Left in bed;with call bell/phone  within reach;with bed alarm set   Nurse Communication Mobility status        Time: 6546-5035 OT Time Calculation (min): 24 min  Charges: OT General Charges $OT Visit: 1 Visit OT Treatments $Self Care/Home Management : 23-37 mins  Darleen Crocker, MS, OTR/L , CBIS ascom [phone]  09/14/20, 3:58 PM

## 2020-09-14 NOTE — Consult Note (Signed)
Consultation Note Date: 09/14/2020   Patient Name: Ariana Lee  DOB: December 02, 1940  MRN: [id number]  Age / Sex: 80 y.o., female  PCP: Pcp, No Referring Physician: Ezekiel Slocumb, DO  Reason for Consultation: Establishing goals of care and Psychosocial/spiritual support  HPI/Patient Profile: 80 y.o. 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  Patient [facility] her [facility].  She has known stage IV lung adenocarcinoma and renal cell carcinoma.  She received previous chemotherapy with pembrolizumab Hepfer treatments were stopped in early July due to decreasing performance status.  She has been having worsening generalized weakness, fatigue, and has been falling with increasing frequency despite using a walker.  She has not been able to complete her ADLs as well.  She has had poor appetite, nausea, and vomiting.  She was started on oral Decadron 2 mg daily to help with fatigue, nausea, appetite on 08/23/2020.   She was seen by her PCP on 09/07/2020 after falling again at home.  She was noted to have contusion on her head and a CT head without contrast was performed which was negative for acute intracranial abnormality or fracture.  Patient states she had another fall yesterday (09/11/2020) after she stumbled.  She did not hit her head but had some bruising to her left arm.  She has been feeling increasingly weak.  She reports weakness in both of her legs but that her left leg has been weaker compared to her right.  She says this has been going on for last 2-3 weeks.  She reports worsening cough from baseline with occasional sputum production.    She tested positive for  COVID.  Patient faces treatment option decisions, advanced directive decisions and anticipatory care needs.    Clinical Assessment and Goals of Care:  This NP Wadie Lessen reviewed medical records, received report from team, assessed the patient and then meet at the patient's bedside  to discuss diagnosis, prognosis, GOC, EOL wishes disposition and options.   Concept of Palliative Care was introduced as specialized medical care for people and their families living with serious illness.  If focuses on providing relief from the symptoms and stress of a serious illness.  The goal is to improve quality of life for both the patient and the family.Values and goals of care important to patient and family were attempted to be elicited.  Created space and opportunity for patient to explore thoughts and feelings regarding current medical situation.  Ms. Tuite tells me that she understands that she has cancer but feels betrayed and that no one ever told her she "has cancer in her bones".  She is unsure about where she is in her oncology treatment, " I will take chemotherapy if it will help me".     A  discussion was had today regarding advanced directives.  Concepts specific to code status, artifical feeding and hydration, continued IV antibiotics and rehospitalization was had.    The difference between a aggressive medical intervention path  and a palliative comfort care path for this patient at this time was had.   Education offered on hospice benefit.  I recommended to Ms. Lober that we schedule a meeting to include her family, she agrees.  I left a message for her son and hopefully we can meet at the bedside sometime tomorrow for continued conversation regarding situation.   Questions and concerns addressed.  Patient  encouraged to call with questions or concerns.     PMT will continue to support holistically.     No documented healthcare power of attorney.  Patient tells me her two sons are   her main support persons and help her with decisions.      SUMMARY OF RECOMMENDATIONS    Code Status/Advance Care Planning: Full code Educated patient to consider DNR/DNI status understanding evidenced based poor outcomes in similar hospitalized patient, as the cause of arrest is likely associated with advanced chronic illness rather than an easily reversible acute cardio-pulmonary event.    Palliative Prophylaxis:  Aspiration, Bowel Regimen, Delirium Protocol, Frequent Pain Assessment, and Oral Care Patient denies pain or discomfort  Additional Recommendations (Limitation Scope, Preferences): Patient would benefit from continued conversation regarding goals of care, advance care planning, discussion around treatment options and anticipatory care needs.  We need to include family and discussion  Psycho-social/Spiritual:  Desire for further Chaplaincy support:no Additional Recommendations: Education on Hospice  Prognosis:  < 12 months  Discharge Planning: To Be Determined      Primary Diagnoses: Present on Admission:  CAD (coronary artery disease), native coronary artery  Essential hypertension  Hypercholesterolemia  PAD (peripheral artery disease) (HCC)  Acute kidney injury superimposed on CKD ([place])  Metastatic primary lung cancer, left ([place])  COVID-19 virus infection   I have reviewed the medical record, interviewed the patient and family, and examined the patient. The following aspects are pertinent.  Past Medical History:  Diagnosis Date   Allergy    Arthritis    Asthma    Coronary artery disease    GERD (gastroesophageal reflux disease)    History of orthopnea    Hypertension    Metastatic primary lung cancer, left ([place]) 09/12/2020   Myocardial infarction [facility])    2003   Orthopnea    Peripheral vascular disease (HCC)    Sleep apnea    TO BE TESTED   Social History   Socioeconomic History   Marital status: Divorced    Spouse name: Not on file   Number  of children: Not on file   Years of education: Not on file   Highest education level: Not on file  Occupational History   Not on file  Tobacco Use   Smoking status: Every Day    Packs/day: 0.25    Types: Cigarettes   Smokeless tobacco: Never  Substance and Sexual Activity   Alcohol use: No   Drug use: Never   Sexual activity: Not on file  Other Topics Concern   Not on file  Social History Narrative   Not on file   Social Determinants of Health   Financial Resource Strain: Not on file  Food Insecurity: Not on file  Transportation Needs: Not on file  Physical Activity: Not on file  Stress: Not on file  Social Connections: Not on file   Family History  Problem Relation Age of Onset  Prostate cancer Brother    CAD Father    Colon cancer Father    Scheduled Meds:  atorvastatin  80 mg Oral QHS   carvedilol  12.5 mg Oral BID WC   dexamethasone  2 mg Oral Daily   felodipine  10 mg Oral Daily   lidocaine  1 patch Transdermal Q24H   pantoprazole  40 mg Oral BID   sodium chloride flush  3 mL Intravenous Q12H   Continuous Infusions:  magnesium sulfate bolus IVPB     remdesivir 100 mg in NS 100 mL 100 mg (09/14/20 1025)   PRN Meds:.acetaminophen, albuterol, guaiFENesin-dextromethorphan, ondansetron **OR** ondansetron (ZOFRAN) IV, senna-docusate, sodium chloride Medications Prior to Admission:  Prior to Admission medications   Medication Sig Start Date End Date Taking? Authorizing Provider  aspirin 81 MG EC tablet Take 81 mg by mouth daily.   Yes [provider]  atorvastatin (LIPITOR) 80 MG tablet Take 80 mg by mouth at bedtime.  08/30/14  Yes [provider]  carvedilol (COREG) 12.5 MG tablet Take 12.5 mg by mouth 2 (two) times daily.   Yes [provider]  dexamethasone (DECADRON) 2 MG tablet Take 2 mg by mouth daily.   Yes [provider]  felodipine (PLENDIL) 10 MG 24 hr tablet Take 10 mg by mouth daily.   Yes [provider]   ibuprofen (ADVIL) 200 MG tablet Take 400-600 mg by mouth every 6 (six) hours as needed for fever, mild pain or moderate pain.   Yes [provider]  Ibuprofen-diphenhydrAMINE HCl (ADVIL PM) 200-25 MG CAPS Take 1-2 tablets by mouth at bedtime as needed (pain or sleep).   Yes [provider]  lisinopril (ZESTRIL) 20 MG tablet Take 20 mg by mouth daily.   Yes [provider]  omeprazole (PRILOSEC) 20 MG capsule Take 20 mg by mouth 2 (two) times daily.   Yes [provider]  ondansetron (ZOFRAN) 8 MG tablet Take 8 mg by mouth 3 (three) times daily as needed for nausea/vomiting.   Yes [provider]  methylphenidate (RITALIN) 5 MG tablet Take 5 mg by mouth daily.    [provider]   Allergies  Allergen Reactions   Ivp Dye [Iodinated Diagnostic Agents] Hives   Percocet [Oxycodone-Acetaminophen] Hives   Review of Systems  Physical Exam  Vital Signs: BP (!) 152/68 (BP Location: Right Arm)   Pulse 71   Temp 98.2 F (36.8 C) (Oral)   Resp 17   Ht 5\' 5"  (1.651 m)   Wt 90.7 kg   SpO2 98%   BMI 33.28 kg/m  Pain Scale: 0-10 POSS *See Group Information*: 1-Acceptable,Awake and alert Pain Score: 0-No pain   SpO2: SpO2: 98 % O2 Device:SpO2: 98 % O2 Flow Rate: .   IO: Intake/output summary:  Intake/Output Summary (Last 24 hours) at 09/14/2020 1247 Last data filed at 09/14/2020 6433 Gross per 24 hour  Intake 155 ml  Output 1250 ml  Net -1095 ml    LBM: Last BM Date:  (pta) Baseline Weight: Weight: 90.7 kg Most recent weight: Weight: 90.7 kg     Palliative Assessment/Data:     Time In: 1600 Time Out: 1700 Time Total: 60 minutes Greater than 50%  of this time was spent counseling and coordinating care related to the above assessment and plan.  Signed by: Wadie Lessen, NP   Please contact Palliative Medicine Team phone at [phone] for questions and concerns.  For individual provider: See Shea Evans

## 2020-09-14 NOTE — TOC Initial Note (Signed)
Transition of Care [facility]) - Initial/Assessment Note    Patient Details  Name: Ariana Lee MRN: [id number] Date of Birth: [birth year]  Transition of Care [facility]) CM/SW Contact:    Beverly Sessions, RN Phone Number: 09/14/2020, 12:17 PM  Clinical Narrative:                 Admitted for: weakness, found to be covid positive Admitted from: home with son PCP: Dr Edison Pace - son transports to appointments Pharmacy: [facility] - denies issues obtaining medications Current home health/prior home health/DME:RW, rollator,[facility]  PT recommending SNF. Patient agreeable to bed search Patient tested positive for covid 8/23, end of 10 quarantine period would 9/3  PASRR obtained Fl2 sent for signature Bed search initiated  VM left for son Palliative consult pending     Expected Discharge Plan: Skilled Nursing Facility Barriers to Discharge: Continued Medical Work up   Patient Goals and CMS Choice        Expected Discharge Plan and Services Expected Discharge Plan: [place]   Discharge Planning Services: CM Consult   Living arrangements for the past 2 months: Single Family Home                                      Prior Living Arrangements/Services Living arrangements for the past 2 months: Single Family Home Lives with:: Adult Children Patient language and need for interpreter reviewed:: Yes        Need for Family Participation in Patient Care: Yes (Comment) Care giver support system in place?: Yes (comment) Current home services: DME Criminal Activity/Legal Involvement Pertinent to Current Situation/Hospitalization: No - Comment as needed  Activities of Daily Living Home Assistive Devices/Equipment: Gilford Rile (specify type) ADL Screening (condition at time of admission) Patient's cognitive ability adequate to safely complete daily activities?: Yes Is the patient deaf or have difficulty hearing?: No Does the patient have difficulty seeing, even when  wearing glasses/contacts?: No Does the patient have difficulty concentrating, remembering, or making decisions?: No Patient able to express need for assistance with ADLs?: Yes Does the patient have difficulty dressing or bathing?: No Independently performs ADLs?: No Communication: Independent Dressing (OT): Independent Grooming: Independent Feeding: Independent Bathing: Independent Toileting: Independent with device (comment) In/Out Bed: Needs assistance Is this a change from baseline?: Pre-admission baseline Walks in Home: Independent with device (comment) Does the patient have difficulty walking or climbing stairs?: Yes Weakness of Legs: Both Weakness of Arms/Hands: None  Permission Sought/Granted                  Emotional Assessment       Orientation: : Oriented to Self, Oriented to Place, Oriented to  Time, Oriented to Situation Alcohol / Substance Use: Not Applicable Psych Involvement: No (comment)  Admission diagnosis:  Weakness [R53.1] Fall [W19.XXXA] Generalized weakness [R53.1] Metastatic malignant neoplasm, unspecified site [facility]) [C79.9] COVID-19 [U07.1] Patient Active Problem List   Diagnosis Date Noted   Generalized weakness 09/12/2020   Acute kidney injury superimposed on CKD ([place]) 09/12/2020   Metastatic primary lung cancer, left ([place]) 09/12/2020   COVID-19 virus infection 09/12/2020   Weakness of both lower limbs 09/09/2014   CAD (coronary artery disease), native coronary artery 07/26/2003   Hypercholesterolemia 05/27/2003   PAD (peripheral artery disease) ([place]) 05/27/2003   Essential hypertension 12/26/2001   PCP:  Pcp, No Pharmacy:   [place], [place] [facility]  [address] [place] [address] Phone: [phone] Fax: [phone]     Social Determinants of Health (SDOH) Interventions    Readmission Risk Interventions No flowsheet data found.

## 2020-09-14 NOTE — NC FL2 (Signed)
[place] LEVEL OF CARE SCREENING TOOL     IDENTIFICATION  Patient Name: Ariana Lee: [birth year] Sex: female Admission Date (Current Location): 09/12/2020  [facility] and [place] Number:  Engineering geologist and Address:         Provider Number: [id number]  Attending Physician Name and Address:  Ezekiel Slocumb, DO  Relative Name and Phone Number:       Current Level of Care: Hospital Recommended Level of Care: [place] Prior Approval Number:    Date Approved/Denied:   PASRR Number: [id number]  Discharge Plan: SNF    Current Diagnoses: Patient Active Problem List   Diagnosis Date Noted   Generalized weakness 09/12/2020   Acute kidney injury superimposed on CKD ([place]) 09/12/2020   Metastatic primary lung cancer, left ([place]) 09/12/2020   COVID-19 virus infection 09/12/2020   Weakness of both lower limbs 09/09/2014   CAD (coronary artery disease), native coronary artery 07/26/2003   Hypercholesterolemia 05/27/2003   PAD (peripheral artery disease) ([place]) 05/27/2003   Essential hypertension 12/26/2001    Orientation RESPIRATION BLADDER Height & Weight     Self, Time, Situation, Place  Normal Incontinent, External catheter Weight: 90.7 kg Height:  5\' 5"  (165.1 cm)  BEHAVIORAL SYMPTOMS/MOOD NEUROLOGICAL BOWEL NUTRITION STATUS      Continent Diet (Regular)  AMBULATORY STATUS COMMUNICATION OF NEEDS Skin   Extensive Assist Verbally Normal                       Personal Care Assistance Level of Assistance              Functional Limitations Info             SPECIAL CARE FACTORS FREQUENCY  PT (By licensed PT), OT (By licensed OT)                    Contractures Contractures Info: Not present    Additional Factors Info  Code Status, Allergies Code Status Info: Full Allergies Info: IVP dye, Percocet           Current Medications (09/14/2020):  This is the current hospital active medication  list Current Facility-Administered Medications  Medication Dose Route Frequency Provider Last Rate Last Admin   acetaminophen (TYLENOL) tablet 1,000 mg  1,000 mg Oral Q6H PRN Zada Finders R, MD       albuterol (VENTOLIN HFA) 108 (90 Base) MCG/ACT inhaler 2 puff  2 puff Inhalation Q6H PRN Zada Finders R, MD       atorvastatin (LIPITOR) tablet 80 mg  80 mg Oral QHS Zada Finders R, MD   80 mg at 09/13/20 2121   carvedilol (COREG) tablet 12.5 mg  12.5 mg Oral BID WC Zada Finders R, MD   12.5 mg at 09/14/20 1021   dexamethasone (DECADRON) tablet 2 mg  2 mg Oral Daily Zada Finders R, MD   2 mg at 09/14/20 1021   felodipine (PLENDIL) 24 hr tablet 10 mg  10 mg Oral Daily Zada Finders R, MD   10 mg at 09/14/20 1021   guaiFENesin-dextromethorphan (ROBITUSSIN DM) 100-10 MG/5ML syrup 10 mL  10 mL Oral Q4H PRN Zada Finders R, MD       lidocaine (LIDODERM) 5 % 1 patch  1 patch Transdermal Q24H Vanessa Cartago, MD   1 patch at 09/13/20 1646   magnesium sulfate IVPB 2 g 50 mL  2 g Intravenous Once Oswald Hillock, [facility]  ondansetron (ZOFRAN) tablet 4 mg  4 mg Oral Q6H PRN Lenore Cordia, MD       Or   ondansetron (ZOFRAN) injection 4 mg  4 mg Intravenous Q6H PRN Lenore Cordia, MD       pantoprazole (PROTONIX) EC tablet 40 mg  40 mg Oral BID Lenore Cordia, MD   40 mg at 09/14/20 1021   remdesivir 100 mg in sodium chloride 0.9 % 100 mL IVPB  100 mg Intravenous Daily Dorothe Pea, RPH 200 mL/hr at 09/14/20 1025 100 mg at 09/14/20 1025   senna-docusate (Senokot-S) tablet 1 tablet  1 tablet Oral QHS PRN Lenore Cordia, MD       sodium chloride (OCEAN) 0.65 % nasal spray 1-2 spray  1-2 spray Each Nare PRN Zada Finders R, MD       sodium chloride flush (NS) 0.9 % injection 3 mL  3 mL Intravenous Q12H Lenore Cordia, MD   3 mL at 09/13/20 2122     Discharge Medications: Please see discharge summary for a list of discharge medications.  Relevant Imaging Results:  Relevant Lab  Results:   Additional Information ss [id number]  Beverly Sessions, RN

## 2020-09-15 DIAGNOSIS — R531 Weakness: Secondary | ICD-10-CM | POA: Diagnosis not present

## 2020-09-15 LAB — COMPREHENSIVE METABOLIC PANEL
ALT: 17 U/L (ref 0–44)
AST: 16 U/L (ref 15–41)
Albumin: 2.2 g/dL — ABNORMAL LOW (ref 3.5–5.0)
Alkaline Phosphatase: 255 U/L — ABNORMAL HIGH (ref 38–126)
Anion gap: 6 (ref 5–15)
BUN: 24 mg/dL — ABNORMAL HIGH (ref 8–23)
CO2: 25 mmol/L (ref 22–32)
Calcium: 8.1 mg/dL — ABNORMAL LOW (ref 8.9–10.3)
Chloride: 107 mmol/L (ref 98–111)
Creatinine, Ser: 0.94 mg/dL (ref 0.44–1.00)
GFR, Estimated: >60 mL/min (ref >60)
Glucose, Bld: 101 mg/dL — ABNORMAL HIGH (ref 70–99)
Potassium: 3.3 mmol/L — ABNORMAL LOW (ref 3.5–5.1)
Sodium: 138 mmol/L (ref 135–145)
Total Bilirubin: 0.7 mg/dL (ref 0.3–1.2)
Total Protein: 4.8 g/dL — ABNORMAL LOW (ref 6.5–8.1)

## 2020-09-15 LAB — CBC
HCT: 32.8 % — ABNORMAL LOW (ref 36.0–46.0)
Hemoglobin: 11.1 g/dL — ABNORMAL LOW (ref 12.0–15.0)
MCH: 28 pg (ref 26.0–34.0)
MCHC: 33.8 g/dL (ref 30.0–36.0)
MCV: 82.6 fL (ref 80.0–100.0)
Platelets: 134 10*3/uL — ABNORMAL LOW (ref 150–400)
RBC: 3.97 MIL/uL (ref 3.87–5.11)
RDW: 16.8 % — ABNORMAL HIGH (ref 11.5–15.5)
WBC: 10.7 10*3/uL — ABNORMAL HIGH (ref 4.0–10.5)
nRBC: 0 % (ref 0.0–0.2)

## 2020-09-15 LAB — MAGNESIUM: Magnesium: 1.9 mg/dL (ref 1.7–2.4)

## 2020-09-15 LAB — C-REACTIVE PROTEIN: CRP: 6 mg/dL — ABNORMAL HIGH (ref <1.0)

## 2020-09-15 MED ORDER — POTASSIUM CHLORIDE CRYS ER 20 MEQ PO TBCR
40.0000 meq | EXTENDED_RELEASE_TABLET | Freq: Once | ORAL | Status: AC
  Administered 2020-09-15: 40 meq via ORAL
  Filled 2020-09-15: qty 2

## 2020-09-15 NOTE — Progress Notes (Signed)
Physical Therapy Treatment Patient Details Name: Ariana Lee MRN: [id number] DOB: 1940/06/01 Today's Date: 09/15/2020    History of Present Illness Pt admitted for weakness with repeated falls. 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 Nuero sx consult recs conservative treatment and PT cleared for mobility this date.    PT Comments    Pt was asleep in long sitting upon arriving. She agrees to PT session and is cooperative throughout. Session greatly limited by hypotension upon standing. Only able to tolerate standing EOB to RW for ~ 10 sec before severe dizziness. BP 106/45. After 2 minutes when returned to supine in bed, BP elevated to 115/70. RN made aware of concerns. Recommend DC to SNF to address deficits with safe functional mobility while assisting pt to PLOF.    Follow Up Recommendations  SNF     Equipment Recommendations  Other (comment) (defer to next level of care)       Precautions / Restrictions Precautions Precautions: Fall Restrictions Weight Bearing Restrictions: No    Mobility  Bed Mobility Overal bed mobility: Needs Assistance Bed Mobility: Supine to Sit;Sit to Supine     Supine to sit: Min assist Sit to supine: Mod assist   General bed mobility comments: pt was able to achieve EOB sitting with min assist. Sat EOB x ~ 8 minutes throughout session. Needed mod assist to quickly return to supine 2/2 to hypotension concern.    Transfers Overall transfer level: Needs assistance Equipment used: Rolling walker (2 wheeled) Transfers: Sit to/from Stand Sit to Stand: Mod assist;From elevated surface         General transfer comment: pt was able to stand 1 x however after ~ 10 sec standing c/o severe dizziness. BP 106/45. pt returned to supine and RN notified. BP once in bed after 2 minutes 115/70  Ambulation/Gait    General Gait Details: unable/unsafe to progress away from EOB.    Balance Overall balance  assessment: Needs assistance;History of Falls Sitting-balance support: Feet supported;Bilateral upper extremity supported Sitting balance-Leahy Scale: Fair Sitting balance - Comments: CGA-supervision while sitting EOB.   Standing balance support: Bilateral upper extremity supported;During functional activity Standing balance-Leahy Scale: Poor Standing balance comment: high fall risk        Cognition Arousal/Alertness: Awake/alert Behavior During Therapy: WFL for tasks assessed/performed Overall Cognitive Status: History of cognitive impairments - at baseline      General Comments: Pt is pleasant and cooperative but remains confused and needing min cuing for sequencing.             Pertinent Vitals/Pain Pain Assessment: No/denies pain     PT Goals (current goals can now be found in the care plan section) Acute Rehab PT Goals Patient Stated Goal: "Get better so I can go home." Progress towards PT goals: Progressing toward goals    Frequency    Min 2X/week      PT Plan Current plan remains appropriate       AM-PAC PT "6 Clicks" Mobility   Outcome Measure  Help needed turning from your back to your side while in a flat bed without using bedrails?: A Little Help needed moving from lying on your back to sitting on the side of a flat bed without using bedrails?: A Little Help needed moving to and from a bed to a chair (including a wheelchair)?: A Lot Help needed standing up from a chair using your arms (e.g., wheelchair or bedside chair)?: A Lot Help  needed to walk in hospital room?: A Lot Help needed climbing 3-5 steps with a railing? : Total 6 Click Score: 13    End of Session Equipment Utilized During Treatment: Gait belt Activity Tolerance: Treatment limited secondary to medical complications (Comment) (limited by hypotension) Patient left: in bed;with bed alarm set Nurse Communication: Mobility status PT Visit Diagnosis: Unsteadiness on feet (R26.81);Repeated  falls (R29.6);Muscle weakness (generalized) (M62.81);History of falling (Z91.81);Difficulty in walking, not elsewhere classified (R26.2)     Time: 6728-9791 PT Time Calculation (min) (ACUTE ONLY): 17 min  Charges:  $Therapeutic Activity: 8-22 mins                     Julaine Fusi PTA 09/15/20, 4:07 PM

## 2020-09-15 NOTE — Progress Notes (Signed)
PROGRESS NOTE    Ariana Lee   YBO:[id number]  DOB: 02/05/1940  PCP: Pcp, No    DOA: 09/12/2020 LOS: 2    Brief Narrative / Hospital Course to Date:   Per HPI on Admission: "Ariana Lee is a 80 y.o. 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  Patient has been on chemotherapy but was stopped recently due to worsening performance status.    Neurosurgery consulted, recommend consideration of palliative radiation therapy.  No surgical intervention indicated.  Assessment & Plan   Principal Problem:   Generalized weakness Active Problems:   CAD (coronary artery disease), native coronary artery   Essential hypertension   Hypercholesterolemia   PAD (peripheral artery disease) (HCC)   Acute kidney injury superimposed on CKD (HCC)   Metastatic primary lung cancer, left ([place])   COVID-19 virus infection   COVID-19 infection Acute on chronic generalized weakness Poor p.o. intake Continue remdesivir. Follow-up PT and OT recommendations Not hypoxic on admission, monitor O2 sats and supplement if needed to maintain sat above 90% Continue home oral Decadron 2 mg daily for appetite, nausea and fatigue  AKI superimposed on CKD stage IIIa -AKI present on admission and most likely due to dehydration, prerenal azotemia.  Treated with IV fluids overnight on admission.   825: AKI resolved --Resume lisinopril tomorrow --Monitor BMP  Stage IV left upper lobe adenocarcinoma of lung with osseous metastases -follows with oncology at [facility].  Previously on Keytruda,  currently on drug holiday due to poor performance status and progressively worsening over last few months.  CT imaging here shows widespread vertebral metastases with associated spinal canal and foraminal stenosis and resulting lower extremity weakness. 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  On review of 8/18 head CT no comment of subdural hematoma not reported. MRI brain here shows nothing acute, no intracranial metastatic disease, small bilateral subdural hygromas measuring up to 6 mm on the right and 2 mm on left with relatively mild mass-effect on the subjacent right cerebral hemisphere without significant midline shift.  Hypokalemia / Hypomagnesemia -resolved with replacement.  Monitor replace as needed  Right upper back and shoulder pain -suspect due to her recent falls.  On exam right trapezius and cervical paraspinal muscles are hypertonic and mildly tender. -- Trial of as needed Robaxin -- Ice or heat packs okay -- As needed Tylenol  COPD -stable without acute exacerbation.   Continue as needed albuterol.  Hypertension -continued on Coreg.   Lisinopril held on admission due to AKI. Resume lisinopril tomorrow As needed oral hydralazine  CAD -with mild but flat troponin elevation but no chest pain or acute ischemic EKG changes.  Likely demand ischemia in the setting of dehydration and COVID-19 infection.  Continue atorvastatin.  Aspirin on hold  Right renal cell carcinoma -seen on CT imaging.  No acute issues.  Monitor  renal function  Obesity: Body mass index is 33.28 kg/m.  Complicates overall  care and prognosis.  Recommend lifestyle modifications including physical activity and diet for weight loss and overall long-term health.   DVT prophylaxis: SCDs Start: 09/12/20 1940   Diet:  Diet Orders (From admission, onward)     Start     Ordered   09/12/20 1939  Diet regular Room service appropriate? Yes; Fluid consistency: Thin  Diet effective now       Question Answer Comment  Room service appropriate? Yes   Fluid consistency: Thin      09/12/20 1938              Code Status: Full Code   Subjective 09/15/20    Patient seen this morning at bedside.  Says she feels okay but still weak.  Does have an occasional cough but denies fevers or chills or shortness of breath.  No acute events reported.   Disposition Plan & Communication   Status is: Inpatient  Remains inpatient appropriate because:IV treatments appropriate due to intensity of illness or inability to take PO.  Anticipate need for SNF placement and completing isolation prior to DC.  Dispo: The patient is from: Home              Anticipated d/c is to: SNF              Patient currently is not medically stable to d/c.   Difficult to place patient No    Consults, Procedures, Significant Events   Consultants:  Palliative care Neurosurgery  Procedures:  None  Antimicrobials:  Anti-infectives (From admission, onward)    Start     Dose/Rate Route Frequency Ordered Stop   09/13/20 1000  remdesivir 100 mg in sodium chloride 0.9 % 100 mL IVPB  Status:  Discontinued       See Hyperspace for full Linked Orders Report.   100 mg 200 mL/hr over 30 Minutes Intravenous Daily 09/12/20 1943 09/12/20 1959   09/13/20 1000  remdesivir 100 mg in sodium chloride 0.9 % 100 mL IVPB       See Hyperspace for full Linked Orders Report.   100 mg 200 mL/hr over 30 Minutes Intravenous Daily 09/12/20 1959 09/17/20 0959   09/12/20 2200  remdesivir 200 mg in sodium chloride 0.9% 250 mL IVPB       See Hyperspace for full Linked  Orders Report.   200 mg 580 mL/hr over 30 Minutes Intravenous  Once 09/12/20 1959 09/13/20 0053   09/12/20 1945  remdesivir 200 mg in sodium chloride 0.9% 250 mL IVPB  Status:  Discontinued       See Hyperspace for full Linked Orders Report.   200 mg 580 mL/hr over 30 Minutes Intravenous Once 09/12/20 1943 09/12/20 1959         Micro    Objective   Vitals:   09/14/20 2051 09/15/20 0431 09/15/20 0920 09/15/20 1543  BP: (!) 123/58 123/60 (!) 151/68 (!) 119/58  Pulse: 70 73 67 72  Resp: 16 14 16 16   Temp: 99 F (37.2 C) 98.3 F (36.8 C) 98.1 F (36.7 C) 98.1 F (36.7 C)  TempSrc: Oral Oral Oral Oral  SpO2: 97% 99% 100% 99%  Weight:      Height:        Intake/Output Summary (Last 24 hours) at 09/15/2020 1637 Last data filed at 09/15/2020 1000 Gross per 24 hour  Intake 240 ml  Output 400 ml  Net -160 ml  Filed Weights   09/12/20 1055  Weight: 90.7 kg    Physical Exam:  General exam: awake, alert, no acute distress, obese Respiratory system: normal respiratory effort, on room air. Cardiovascular system: normal S1/S2, RRR Central nervous system: A&O x3. Normal speech, grossly non-focal exam Psychiatry: normal mood, congruent affect, judgement and insight appear normal  Labs   Data Reviewed: I have personally reviewed following labs and imaging studies  CBC: Recent Labs  Lab 09/12/20 1054 09/13/20 1018 09/14/20 0752 09/15/20 0610  WBC 16.7* 14.0* 13.2* 10.7*  NEUTROABS  --  10.8*  --   --   HGB 13.7 11.9* 11.4* 11.1*  HCT 42.6 36.9 34.5* 32.8*  MCV 85.7 86.4 83.3 82.6  PLT 153 132* 144* 048*   Basic Metabolic Panel: Recent Labs  Lab 09/12/20 1054 09/13/20 1018 09/14/20 0752 09/15/20 0610  NA 140 141 137 138  K 3.4* 3.7 3.5 3.3*  CL 103 104 106 107  CO2 20* 23 24 25   GLUCOSE 97 133* 103* 101*  BUN 20 22 27* 24*  CREATININE 1.33* 1.20* 0.87 0.94  CALCIUM 9.3 8.8* 8.6* 8.1*  MG  --  1.7 1.6* 1.9   GFR: Estimated Creatinine Clearance: 52.2  mL/min (by C-G formula based on SCr of 0.94 mg/dL). Liver Function Tests: Recent Labs  Lab 09/12/20 1350 09/13/20 1018 09/14/20 0752 09/15/20 0610  AST 26 31 18 16   ALT 21 19 18 17   ALKPHOS 192* 281* 268* 255*  BILITOT 1.0 0.9 0.8 0.7  PROT 6.2* 5.5* 5.2* 4.8*  ALBUMIN 3.1* 2.6* 2.6* 2.2*   No results for input(s): LIPASE, AMYLASE in the last 168 hours. No results for input(s): AMMONIA in the last 168 hours. Coagulation Profile: No results for input(s): INR, PROTIME in the last 168 hours. Cardiac Enzymes: Recent Labs  Lab 09/12/20 1350  CKTOTAL 115   BNP (last 3 results) No results for input(s): PROBNP in the last 8760 hours. HbA1C: No results for input(s): HGBA1C in the last 72 hours. CBG: Recent Labs  Lab 09/12/20 1513  GLUCAP 105*   Lipid Profile: No results for input(s): CHOL, HDL, LDLCALC, TRIG, CHOLHDL, LDLDIRECT in the last 72 hours. Thyroid Function Tests: No results for input(s): TSH, T4TOTAL, FREET4, T3FREE, THYROIDAB in the last 72 hours. Anemia Panel: No results for input(s): VITAMINB12, FOLATE, FERRITIN, TIBC, IRON, RETICCTPCT in the last 72 hours. Sepsis Labs: Recent Labs  Lab 09/12/20 1802  PROCALCITON 0.56    Recent Results (from the past 240 hour(s))  Resp Panel by RT-PCR (Flu A&B, Covid) Nasopharyngeal Swab     Status: Abnormal   Collection Time: 09/12/20  3:12 PM   Specimen: Nasopharyngeal Swab; Nasopharyngeal(NP) swabs in vial transport medium  Result Value Ref Range Status   SARS Coronavirus 2 by RT PCR POSITIVE (A) NEGATIVE Final    Comment: RESULT CALLED TO, READ BACK BY AND VERIFIED WITH: KATIE RAND AT 8891 ON 09/12/20 BY SS (NOTE) SARS-CoV-2 target nucleic acids are DETECTED.  The SARS-CoV-2 RNA is generally detectable in upper respiratory specimens during the acute phase of infection. Positive results are indicative of the presence of the identified virus, but do not rule out bacterial infection or co-infection with other  pathogens not detected by the test. Clinical correlation with patient history and other diagnostic information is necessary to determine patient infection status. The expected result is Negative.  Fact Sheet for Patients: EntrepreneurPulse.com.au  Fact Sheet for Healthcare Providers: IncredibleEmployment.be  This test is not yet approved or cleared by the  [place] Architectural technologist and  has been authorized for detection and/or diagnosis of SARS-CoV-2 by FDA under an Print production planner (EUA).  This EUA will remain in effect (meaning this test can  be used) for the duration of  the COVID-19 declaration under Section 564(b)(1) of the Act, 21 U.S.C. section 360bbb-3(b)(1), unless the authorization is terminated or revoked sooner.     Influenza A by PCR NEGATIVE NEGATIVE Final   Influenza B by PCR NEGATIVE NEGATIVE Final    Comment: (NOTE) The Xpert Xpress SARS-CoV-2/FLU/RSV plus assay is intended as an aid in the diagnosis of influenza from Nasopharyngeal swab specimens and should not be used as a sole basis for treatment. Nasal washings and aspirates are unacceptable for Xpert Xpress SARS-CoV-2/FLU/RSV testing.  Fact Sheet for Patients: EntrepreneurPulse.com.au  Fact Sheet for Healthcare Providers: IncredibleEmployment.be  This test is not yet approved or cleared by the [place] FDA and has been authorized for detection and/or diagnosis of SARS-CoV-2 by FDA under an Emergency Use Authorization (EUA). This EUA will remain in effect (meaning this test can be used) for the duration of the COVID-19 declaration under Section 564(b)(1) of the Act, 21 U.S.C. section 360bbb-3(b)(1), unless the authorization is terminated or revoked.  Performed at [facility], [address], [place], [place] [address]       Imaging Studies   No results found.   Medications   Scheduled Meds:   atorvastatin  80 mg Oral QHS   carvedilol  12.5 mg Oral BID WC   dexamethasone  2 mg Oral Daily   felodipine  10 mg Oral Daily   lidocaine  1 patch Transdermal Q24H   lisinopril  20 mg Oral Daily   pantoprazole  40 mg Oral BID   sodium chloride flush  3 mL Intravenous Q12H   Continuous Infusions:  remdesivir 100 mg in NS 100 mL 100 mg (09/15/20 1011)       LOS: 2 days    Time spent: 25 minutes with > 50% spent at bedside and in coordination of care     Ezekiel Slocumb, DO [facility]  09/15/2020, 4:37 PM      If 7PM-7AM, please contact night-coverage. How to contact the [facility] Attending or Consulting provider [place] or covering provider during after hours [place], for this patient?    Check the care team in [facility] and look for a) attending/consulting [facility] provider listed and b) the [facility] team listed Log into [email or web address] and use [place]'s universal password to access. If you do not have the password, please contact the hospital operator. Locate the [facility] provider you are looking for under [facility] and page to a number that you can be directly reached. If you still have difficulty reaching the provider, please page the [facility] (Director on Call) for the Hospitalists listed on amion for assistance.

## 2020-09-16 DIAGNOSIS — R531 Weakness: Secondary | ICD-10-CM | POA: Diagnosis not present

## 2020-09-16 LAB — CBC
HCT: 33.8 % — ABNORMAL LOW (ref 36.0–46.0)
Hemoglobin: 11.4 g/dL — ABNORMAL LOW (ref 12.0–15.0)
MCH: 28.6 pg (ref 26.0–34.0)
MCHC: 33.7 g/dL (ref 30.0–36.0)
MCV: 84.7 fL (ref 80.0–100.0)
Platelets: 147 10*3/uL — ABNORMAL LOW (ref 150–400)
RBC: 3.99 MIL/uL (ref 3.87–5.11)
RDW: 17.1 % — ABNORMAL HIGH (ref 11.5–15.5)
WBC: 11 10*3/uL — ABNORMAL HIGH (ref 4.0–10.5)
nRBC: 0 % (ref 0.0–0.2)

## 2020-09-16 LAB — BASIC METABOLIC PANEL
Anion gap: 6 (ref 5–15)
BUN: 28 mg/dL — ABNORMAL HIGH (ref 8–23)
CO2: 24 mmol/L (ref 22–32)
Calcium: 8.3 mg/dL — ABNORMAL LOW (ref 8.9–10.3)
Chloride: 109 mmol/L (ref 98–111)
Creatinine, Ser: 0.98 mg/dL (ref 0.44–1.00)
GFR, Estimated: 58 mL/min — ABNORMAL LOW (ref >60)
Glucose, Bld: 106 mg/dL — ABNORMAL HIGH (ref 70–99)
Potassium: 3.9 mmol/L (ref 3.5–5.1)
Sodium: 139 mmol/L (ref 135–145)

## 2020-09-16 LAB — C-REACTIVE PROTEIN: CRP: 7 mg/dL — ABNORMAL HIGH (ref <1.0)

## 2020-09-16 LAB — MAGNESIUM: Magnesium: 2 mg/dL (ref 1.7–2.4)

## 2020-09-16 NOTE — TOC Progression Note (Signed)
Transition of Care [facility]) - Progression Note    Patient Details  Name: Ariana Lee MRN: [id number] Date of Birth: 1940/04/01  [facility]) CM/SW [place], LCSW Phone Number: 09/16/2020, 10:36 AM  Clinical Narrative:   Patient currently has 2 bed offers- [facility] and [facility] Printed Medicare.gov ratings for patient to review and will ask RN to deliver due to airborne isolation. Patient cannot go to SNF until 9/3 due to COVID diagnosis.     Expected Discharge Plan: [place] Barriers to Discharge: Continued Medical Work up  Expected Discharge Plan and Services Expected Discharge Plan: [place]   Discharge Planning Services: CM Consult   Living arrangements for the past 2 months: Single Family Home                                       Social Determinants of Health (SDOH) Interventions    Readmission Risk Interventions No flowsheet data found.

## 2020-09-16 NOTE — Progress Notes (Signed)
PROGRESS NOTE    Ariana Lee   BZJ:[id number]  DOB: [birth year]  PCP: Pcp, No    DOA: 09/12/2020 LOS: 3    Brief Narrative / Hospital Course to Date:   Per HPI on Admission: "Ariana Lee is a 80 y.o. 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  Patient has been on chemotherapy but was stopped recently due to worsening performance status.    Neurosurgery consulted, recommend consideration of palliative radiation therapy.  No surgical intervention indicated.  Assessment & Plan   Principal Problem:   Generalized weakness Active Problems:   CAD (coronary artery disease), native coronary artery   Essential hypertension   Hypercholesterolemia   PAD (peripheral artery disease) (HCC)   Acute kidney injury superimposed on CKD (HCC)   Metastatic primary lung cancer, left ([place])   COVID-19 virus infection   COVID-19 infection Acute on chronic generalized weakness Poor p.o. intake Continue remdesivir. Follow-up PT and OT recommendations Not hypoxic on admission, monitor O2 sats and supplement if needed to maintain sat above 90% Continue home oral Decadron 2 mg daily for appetite, nausea and fatigue  AKI superimposed on CKD stage IIIa -AKI present on admission and most likely due to dehydration, prerenal azotemia.  Treated with IV fluids overnight on admission.   825: AKI resolved --Resume lisinopril tomorrow --Monitor BMP  Stage IV left upper lobe adenocarcinoma of lung with osseous metastases -follows with oncology at [facility].  Previously on Keytruda,  currently on drug holiday due to poor performance status and progressively worsening over last few months.  CT imaging here shows widespread vertebral metastases with associated spinal canal and foraminal stenosis and resulting lower extremity weakness. 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  On review of 8/18 head CT no comment of subdural hematoma not reported. MRI brain here shows nothing acute, no intracranial metastatic disease, small bilateral subdural hygromas measuring up to 6 mm on the right and 2 mm on left with relatively mild mass-effect on the subjacent right cerebral hemisphere without significant midline shift.  Hypokalemia / Hypomagnesemia -resolved with replacement.  Monitor replace as needed  Right upper back and shoulder pain -suspect due to her recent falls.  On exam right trapezius and cervical paraspinal muscles are hypertonic and mildly tender. -- Trial of as needed Robaxin -- Ice or heat packs okay -- As needed Tylenol  COPD -stable without acute exacerbation.   Continue as needed albuterol.  Hypertension -continued on Coreg.   Lisinopril held on admission due to AKI. Resume lisinopril tomorrow As needed oral hydralazine  CAD -with mild but flat troponin elevation but no chest pain or acute ischemic EKG changes.  Likely demand ischemia in the setting of dehydration and COVID-19 infection.  Continue atorvastatin.  Aspirin on hold  Right renal cell carcinoma -seen on CT imaging.  No acute issues.  Monitor  renal function  Obesity: Body mass index is 33.28 kg/m.  Complicates overall  care and prognosis.  Recommend lifestyle modifications including physical activity and diet for weight loss and overall long-term health.   DVT prophylaxis: SCDs Start: 09/12/20 1940   Diet:  Diet Orders (From admission, onward)     Start     Ordered   09/12/20 1939  Diet regular Room service appropriate? Yes; Fluid consistency: Thin  Diet effective now       Question Answer Comment  Room service appropriate? Yes   Fluid consistency: Thin      09/12/20 1938              Code Status: Full Code   Subjective 09/16/20    Patient seen this morning at bedside.  Reports feeling well, continues to feel weak and fatigued.   Sleeping when I entered, woke to voice.     Disposition Plan & Communication   Status is: Inpatient  Remains inpatient appropriate because: Needs SNF placement and requires completions of 10 day isolation prior to DC.  Dispo: The patient is from: Home              Anticipated d/c is to: SNF              Patient currently is medically stable for d/c.   Difficult to place patient No    Consults, Procedures, Significant Events   Consultants:  Palliative care Neurosurgery  Procedures:  None  Antimicrobials:  Anti-infectives (From admission, onward)    Start     Dose/Rate Route Frequency Ordered Stop   09/13/20 1000  remdesivir 100 mg in sodium chloride 0.9 % 100 mL IVPB  Status:  Discontinued       See Hyperspace for full Linked Orders Report.   100 mg 200 mL/hr over 30 Minutes Intravenous Daily 09/12/20 1943 09/12/20 1959   09/13/20 1000  remdesivir 100 mg in sodium chloride 0.9 % 100 mL IVPB       See Hyperspace for full Linked Orders Report.   100 mg 200 mL/hr over 30 Minutes Intravenous Daily 09/12/20 1959 09/16/20 1045   09/12/20 2200  remdesivir 200 mg in sodium chloride 0.9% 250 mL IVPB       See Hyperspace for full Linked Orders Report.   200 mg 580 mL/hr over 30 Minutes Intravenous  Once 09/12/20 1959 09/13/20 0053   09/12/20 1945   remdesivir 200 mg in sodium chloride 0.9% 250 mL IVPB  Status:  Discontinued       See Hyperspace for full Linked Orders Report.   200 mg 580 mL/hr over 30 Minutes Intravenous Once 09/12/20 1943 09/12/20 1959         Micro    Objective   Vitals:   09/15/20 1935 09/16/20 0456 09/16/20 0458 09/16/20 0837  BP: 122/60 123/67 130/74 102/82  Pulse: 79 71 72 72  Resp: 16 16  18   Temp: 98.4 F (36.9 C) 98.4 F (36.9 C)  97.8 F (36.6 C)  TempSrc: Oral Oral  Oral  SpO2: 99% 100%  100%  Weight:      Height:        Intake/Output Summary (Last 24 hours) at 09/16/2020 1452 Last data filed at 09/16/2020 0547 Gross per 24 hour  Intake --  Output 500 ml  Net -500 ml   Filed Weights   09/12/20 1055  Weight: 90.7 kg    Physical Exam:  General exam: awake, alert, no acute distress, obese Respiratory  system: normal respiratory effort, on room air. Cardiovascular system: normal S1/S2, RRR Central nervous system: A&O x3. Normal speech, grossly non-focal exam   Labs   Data Reviewed: I have personally reviewed following labs and imaging studies  CBC: Recent Labs  Lab 09/12/20 1054 09/13/20 1018 09/14/20 0752 09/15/20 0610 09/16/20 0445  WBC 16.7* 14.0* 13.2* 10.7* 11.0*  NEUTROABS  --  10.8*  --   --   --   HGB 13.7 11.9* 11.4* 11.1* 11.4*  HCT 42.6 36.9 34.5* 32.8* 33.8*  MCV 85.7 86.4 83.3 82.6 84.7  PLT 153 132* 144* 134* 147*   Basic Metabolic Panel: Recent Labs  Lab 09/12/20 1054 09/13/20 1018 09/14/20 0752 09/15/20 0610 09/16/20 0445  NA 140 141 137 138 139  K 3.4* 3.7 3.5 3.3* 3.9  CL 103 104 106 107 109  CO2 20* 23 24 25 24   GLUCOSE 97 133* 103* 101* 106*  BUN 20 22 27* 24* 28*  CREATININE 1.33* 1.20* 0.87 0.94 0.98  CALCIUM 9.3 8.8* 8.6* 8.1* 8.3*  MG  --  1.7 1.6* 1.9 2.0   GFR: Estimated Creatinine Clearance: 50.1 mL/min (by C-G formula based on SCr of 0.98 mg/dL). Liver Function Tests: Recent Labs  Lab 09/12/20 1350 09/13/20 1018  09/14/20 0752 09/15/20 0610  AST 26 31 18 16   ALT 21 19 18 17   ALKPHOS 192* 281* 268* 255*  BILITOT 1.0 0.9 0.8 0.7  PROT 6.2* 5.5* 5.2* 4.8*  ALBUMIN 3.1* 2.6* 2.6* 2.2*   No results for input(s): LIPASE, AMYLASE in the last 168 hours. No results for input(s): AMMONIA in the last 168 hours. Coagulation Profile: No results for input(s): INR, PROTIME in the last 168 hours. Cardiac Enzymes: Recent Labs  Lab 09/12/20 1350  CKTOTAL 115   BNP (last 3 results) No results for input(s): PROBNP in the last 8760 hours. HbA1C: No results for input(s): HGBA1C in the last 72 hours. CBG: Recent Labs  Lab 09/12/20 1513  GLUCAP 105*   Lipid Profile: No results for input(s): CHOL, HDL, LDLCALC, TRIG, CHOLHDL, LDLDIRECT in the last 72 hours. Thyroid Function Tests: No results for input(s): TSH, T4TOTAL, FREET4, T3FREE, THYROIDAB in the last 72 hours. Anemia Panel: No results for input(s): VITAMINB12, FOLATE, FERRITIN, TIBC, IRON, RETICCTPCT in the last 72 hours. Sepsis Labs: Recent Labs  Lab 09/12/20 1802  PROCALCITON 0.56    Recent Results (from the past 240 hour(s))  Resp Panel by RT-PCR (Flu A&B, Covid) Nasopharyngeal Swab     Status: Abnormal   Collection Time: 09/12/20  3:12 PM   Specimen: Nasopharyngeal Swab; Nasopharyngeal(NP) swabs in vial transport medium  Result Value Ref Range Status   SARS Coronavirus 2 by RT PCR POSITIVE (A) NEGATIVE Final    Comment: RESULT CALLED TO, READ BACK BY AND VERIFIED WITH: KATIE RAND AT 8295 ON 09/12/20 BY SS (NOTE) SARS-CoV-2 target nucleic acids are DETECTED.  The SARS-CoV-2 RNA is generally detectable in upper respiratory specimens during the acute phase of infection. Positive results are indicative of the presence of the identified virus, but do not rule out bacterial infection or co-infection with other pathogens not detected by the test. Clinical correlation with patient history and other diagnostic information is necessary to  determine patient infection status. The expected result is Negative.  Fact Sheet for Patients: EntrepreneurPulse.com.au  Fact Sheet for Healthcare Providers: IncredibleEmployment.be  This test is not yet approved or cleared by the [place] FDA and  has been authorized for detection and/or diagnosis of  SARS-CoV-2 by FDA under an Emergency Use Authorization (EUA).  This EUA will remain in effect (meaning this test can  be used) for the duration of  the COVID-19 declaration under Section 564(b)(1) of the Act, 21 U.S.C. section 360bbb-3(b)(1), unless the authorization is terminated or revoked sooner.     Influenza A by PCR NEGATIVE NEGATIVE Final   Influenza B by PCR NEGATIVE NEGATIVE Final    Comment: (NOTE) The Xpert Xpress SARS-CoV-2/FLU/RSV plus assay is intended as an aid in the diagnosis of influenza from Nasopharyngeal swab specimens and should not be used as a sole basis for treatment. Nasal washings and aspirates are unacceptable for Xpert Xpress SARS-CoV-2/FLU/RSV testing.  Fact Sheet for Patients: EntrepreneurPulse.com.au  Fact Sheet for Healthcare Providers: IncredibleEmployment.be  This test is not yet approved or cleared by the [place] FDA and has been authorized for detection and/or diagnosis of SARS-CoV-2 by FDA under an Emergency Use Authorization (EUA). This EUA will remain in effect (meaning this test can be used) for the duration of the COVID-19 declaration under Section 564(b)(1) of the Act, 21 U.S.C. section 360bbb-3(b)(1), unless the authorization is terminated or revoked.  Performed at [facility], [address], [place], [place] [address]       Imaging Studies   No results found.   Medications   Scheduled Meds:  atorvastatin  80 mg Oral QHS   carvedilol  12.5 mg Oral BID WC   dexamethasone  2 mg Oral Daily   felodipine  10 mg Oral Daily    lidocaine  1 patch Transdermal Q24H   lisinopril  20 mg Oral Daily   pantoprazole  40 mg Oral BID   sodium chloride flush  3 mL Intravenous Q12H   Continuous Infusions:       LOS: 3 days    Time spent: 20 minutes      Ezekiel Slocumb, DO [facility]  09/16/2020, 2:52 PM      If 7PM-7AM, please contact night-coverage. How to contact the [facility] Attending or Consulting provider [place] or covering provider during after hours [place], for this patient?    Check the care team in [facility] and look for a) attending/consulting [facility] provider listed and b) the [facility] team listed Log into [email or web address] and use [place]'s universal password to access. If you do not have the password, please contact the hospital operator. Locate the [facility] provider you are looking for under [facility] and page to a number that you can be directly reached. If you still have difficulty reaching the provider, please page the [facility] (Director on Call) for the Hospitalists listed on amion for assistance.

## 2020-09-17 DIAGNOSIS — R531 Weakness: Secondary | ICD-10-CM | POA: Diagnosis not present

## 2020-09-17 LAB — CBC
HCT: 34.7 % — ABNORMAL LOW (ref 36.0–46.0)
Hemoglobin: 11.4 g/dL — ABNORMAL LOW (ref 12.0–15.0)
MCH: 27.5 pg (ref 26.0–34.0)
MCHC: 32.9 g/dL (ref 30.0–36.0)
MCV: 83.8 fL (ref 80.0–100.0)
Platelets: 148 10*3/uL — ABNORMAL LOW (ref 150–400)
RBC: 4.14 MIL/uL (ref 3.87–5.11)
RDW: 16.9 % — ABNORMAL HIGH (ref 11.5–15.5)
WBC: 11.9 10*3/uL — ABNORMAL HIGH (ref 4.0–10.5)
nRBC: 0 % (ref 0.0–0.2)

## 2020-09-17 LAB — BASIC METABOLIC PANEL
Anion gap: 7 (ref 5–15)
BUN: 28 mg/dL — ABNORMAL HIGH (ref 8–23)
CO2: 24 mmol/L (ref 22–32)
Calcium: 8.5 mg/dL — ABNORMAL LOW (ref 8.9–10.3)
Chloride: 107 mmol/L (ref 98–111)
Creatinine, Ser: 0.96 mg/dL (ref 0.44–1.00)
GFR, Estimated: 59 mL/min — ABNORMAL LOW (ref >60)
Glucose, Bld: 105 mg/dL — ABNORMAL HIGH (ref 70–99)
Potassium: 4.2 mmol/L (ref 3.5–5.1)
Sodium: 138 mmol/L (ref 135–145)

## 2020-09-17 LAB — C-REACTIVE PROTEIN: CRP: 6.2 mg/dL — ABNORMAL HIGH (ref <1.0)

## 2020-09-17 MED ORDER — BISACODYL 5 MG PO TBEC: 5.0000 mg | DELAYED_RELEASE_TABLET | Freq: Every day | ORAL | Status: DC | PRN

## 2020-09-17 MED ORDER — SENNOSIDES-DOCUSATE SODIUM 8.6-50 MG PO TABS
1.0000 | ORAL_TABLET | Freq: Two times a day (BID) | ORAL | Status: DC
  Administered 2020-09-17 – 2020-09-23 (×11): 1 via ORAL
  Filled 2020-09-17 (×13): qty 1

## 2020-09-17 MED ORDER — POLYETHYLENE GLYCOL 3350 17 G PO PACK
17.0000 g | PACK | Freq: Every day | ORAL | Status: DC
  Administered 2020-09-17 – 2020-09-21 (×4): 17 g via ORAL
  Filled 2020-09-17 (×7): qty 1

## 2020-09-17 NOTE — Progress Notes (Addendum)
PROGRESS NOTE    Ariana Lee   VQM:[id number]  DOB: 1940/03/05  PCP: Pcp, No    DOA: 09/12/2020 LOS: 4    Brief Narrative / Hospital Course to Date:   Per HPI on Admission: "Ariana Lee is a 80 y.o. 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  Patient has been on chemotherapy but was stopped recently due to worsening performance status.    Neurosurgery consulted, recommend consideration of palliative radiation therapy.  No surgical intervention indicated.  Assessment & Plan   Principal Problem:   Generalized weakness Active Problems:   CAD (coronary artery disease), native coronary artery   Essential hypertension   Hypercholesterolemia   PAD (peripheral artery disease) (HCC)   Acute kidney injury superimposed on CKD (HCC)   Metastatic primary lung cancer, left (HCC)   COVID-19 virus infection   COVID-19 infection Acute on chronic generalized weakness Poor p.o. intake Completed 5 days remdesivir. PT and OT recommendation SNF for rehab, placement pending 10-day isolation period. Not hypoxic on admission, monitor O2 sats and supplement if needed to maintain sat above 90% Continue home oral Decadron 2 mg daily for appetite, nausea and fatigue  AKI superimposed on CKD stage IIIa -AKI present on admission and most likely due to dehydration, prerenal azotemia.  Treated with IV fluids overnight on admission.   825: AKI resolved --Resume lisinopril tomorrow --Monitor BMP  Stage IV left upper lobe adenocarcinoma of lung with osseous metastases  -follows with oncology at [facility].  Previously on Keytruda, currently on drug holiday due to poor performance status and progressively worsening over last few months.  CT imaging here shows widespread vertebral metastases with associated spinal canal and foraminal stenosis and resulting lower extremity weakness. 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  On review of 8/18 head CT no comment of subdural hematoma not reported. MRI brain here shows nothing acute, no intracranial metastatic disease, small bilateral subdural hygromas measuring up to 6 mm on the right and 2 mm on left with relatively mild mass-effect on the subjacent right cerebral hemisphere without significant midline shift.  Hypokalemia / Hypomagnesemia -resolved with replacement.  Monitor replace as needed  Constipation -bowel regimen ordered  Right upper back and shoulder pain -suspect due to her recent falls.  On exam right trapezius and cervical paraspinal muscles are hypertonic and mildly tender. -- Trial of as needed Robaxin -- Ice or heat packs okay -- As needed Tylenol  COPD -stable without acute exacerbation.   Continue as needed albuterol.  Hypertension -continued on Coreg.   Lisinopril held on admission due to AKI. Resume lisinopril tomorrow As needed oral hydralazine  CAD -with mild but flat troponin elevation but no chest pain or acute ischemic EKG changes.  Likely demand ischemia in the setting of dehydration and COVID-19 infection.  Continue atorvastatin.  Aspirin on hold  Right renal cell carcinoma -seen on CT imaging.  No acute  issues.  Monitor renal function  Obesity: Body mass index is 33.28 kg/m.  Complicates overall care and prognosis.  Recommend lifestyle modifications including physical activity and diet for weight loss and overall long-term health.   DVT prophylaxis: SCDs Start: 09/12/20 1940   Diet:  Diet Orders (From admission, onward)     Start     Ordered   09/12/20 1939  Diet regular Room service appropriate? Yes; Fluid consistency: Thin  Diet effective now       Question Answer Comment  Room service appropriate? Yes   Fluid consistency: Thin      09/12/20 1938              Code Status: Full Code   Subjective 09/17/20    Patient seen this morning at bedside.  Reports only having 1 bowel movement since admission.  Denies other acute complaints including fever chills, chest pain or shortness of breath, nausea vomiting.   Disposition Plan & Communication   Status is: Inpatient  Remains inpatient appropriate because: Needs SNF placement and requires completions of 10 day isolation prior to DC.  Dispo: The patient is from: Home              Anticipated d/c is to: SNF              Patient currently is medically stable for d/c.   Difficult to place patient No    Consults, Procedures, Significant Events   Consultants:  Palliative care Neurosurgery  Procedures:  None  Antimicrobials:  Anti-infectives (From admission, onward)    Start     Dose/Rate Route Frequency Ordered Stop   09/13/20 1000  remdesivir 100 mg in sodium chloride 0.9 % 100 mL IVPB  Status:  Discontinued       See Hyperspace for full Linked Orders Report.   100 mg 200 mL/hr over 30 Minutes Intravenous Daily 09/12/20 1943 09/12/20 1959   09/13/20 1000  remdesivir 100 mg in sodium chloride 0.9 % 100 mL IVPB       See Hyperspace for full Linked Orders Report.   100 mg 200 mL/hr over 30 Minutes Intravenous Daily 09/12/20 1959 09/16/20 1045   09/12/20 2200  remdesivir 200 mg in sodium chloride 0.9% 250 mL IVPB        See Hyperspace for full Linked Orders Report.   200 mg 580 mL/hr over 30 Minutes Intravenous  Once 09/12/20 1959 09/13/20 0053   09/12/20 1945  remdesivir 200 mg in sodium chloride 0.9% 250 mL IVPB  Status:  Discontinued       See Hyperspace for full Linked Orders Report.   200 mg 580 mL/hr over 30 Minutes Intravenous Once 09/12/20 1943 09/12/20 1959         Micro    Objective   Vitals:   09/16/20 1635 09/16/20 2015 09/17/20 0421 09/17/20 0855  BP: 130/63 101/81 134/63 (!) 156/70  Pulse: 91 74 71 72  Resp: 18 18 18 18   Temp: 98.3 F (36.8 C) 98 F (36.7 C) 97.9 F (36.6 C)   TempSrc: Oral  Oral   SpO2: 99% 100% 97% 100%  Weight:      Height:        Intake/Output Summary (Last 24 hours) at 09/17/2020 1537 Last data filed at 09/17/2020 0430 Gross per 24 hour  Intake 10 ml  Output 200 ml  Net -190 ml   Filed Weights   09/12/20  1055  Weight: 90.7 kg    Physical Exam:  General exam: awake, alert, no acute distress, obese Respiratory system: normal respiratory effort, on room air. Cardiovascular system: normal S1/S2, RRR Central nervous system: A&O x3. Normal speech, grossly non-focal exam Extremities: Nonpitting lower extremity edema and dry skin, SCDs in place   Labs   Data Reviewed: I have personally reviewed following labs and imaging studies  CBC: Recent Labs  Lab 09/13/20 1018 09/14/20 0752 09/15/20 0610 09/16/20 0445 09/17/20 0538  WBC 14.0* 13.2* 10.7* 11.0* 11.9*  NEUTROABS 10.8*  --   --   --   --   HGB 11.9* 11.4* 11.1* 11.4* 11.4*  HCT 36.9 34.5* 32.8* 33.8* 34.7*  MCV 86.4 83.3 82.6 84.7 83.8  PLT 132* 144* 134* 147* 546*   Basic Metabolic Panel: Recent Labs  Lab 09/13/20 1018 09/14/20 0752 09/15/20 0610 09/16/20 0445 09/17/20 0538  NA 141 137 138 139 138  K 3.7 3.5 3.3* 3.9 4.2  CL 104 106 107 109 107  CO2 23 24 25 24 24   GLUCOSE 133* 103* 101* 106* 105*  BUN 22 27* 24* 28* 28*  CREATININE 1.20* 0.87 0.94 0.98 0.96   CALCIUM 8.8* 8.6* 8.1* 8.3* 8.5*  MG 1.7 1.6* 1.9 2.0  --    GFR: Estimated Creatinine Clearance: 51.2 mL/min (by C-G formula based on SCr of 0.96 mg/dL). Liver Function Tests: Recent Labs  Lab 09/12/20 1350 09/13/20 1018 09/14/20 0752 09/15/20 0610  AST 26 31 18 16   ALT 21 19 18 17   ALKPHOS 192* 281* 268* 255*  BILITOT 1.0 0.9 0.8 0.7  PROT 6.2* 5.5* 5.2* 4.8*  ALBUMIN 3.1* 2.6* 2.6* 2.2*   No results for input(s): LIPASE, AMYLASE in the last 168 hours. No results for input(s): AMMONIA in the last 168 hours. Coagulation Profile: No results for input(s): INR, PROTIME in the last 168 hours. Cardiac Enzymes: Recent Labs  Lab 09/12/20 1350  CKTOTAL 115   BNP (last 3 results) No results for input(s): PROBNP in the last 8760 hours. HbA1C: No results for input(s): HGBA1C in the last 72 hours. CBG: Recent Labs  Lab 09/12/20 1513  GLUCAP 105*   Lipid Profile: No results for input(s): CHOL, HDL, LDLCALC, TRIG, CHOLHDL, LDLDIRECT in the last 72 hours. Thyroid Function Tests: No results for input(s): TSH, T4TOTAL, FREET4, T3FREE, THYROIDAB in the last 72 hours. Anemia Panel: No results for input(s): VITAMINB12, FOLATE, FERRITIN, TIBC, IRON, RETICCTPCT in the last 72 hours. Sepsis Labs: Recent Labs  Lab 09/12/20 1802  PROCALCITON 0.56    Recent Results (from the past 240 hour(s))  Resp Panel by RT-PCR (Flu A&B, Covid) Nasopharyngeal Swab     Status: Abnormal   Collection Time: 09/12/20  3:12 PM   Specimen: Nasopharyngeal Swab; Nasopharyngeal(NP) swabs in vial transport medium  Result Value Ref Range Status   SARS Coronavirus 2 by RT PCR POSITIVE (A) NEGATIVE Final    Comment: RESULT CALLED TO, READ BACK BY AND VERIFIED WITH: KATIE RAND AT 5681 ON 09/12/20 BY SS (NOTE) SARS-CoV-2 target nucleic acids are DETECTED.  The SARS-CoV-2 RNA is generally detectable in upper respiratory specimens during the acute phase of infection. Positive results are indicative of the  presence of the identified virus, but do not rule out bacterial infection or co-infection with other pathogens not detected by the test. Clinical correlation with patient history and other diagnostic information is necessary to determine patient infection status. The expected result is Negative.  Fact Sheet for Patients: EntrepreneurPulse.com.au  Fact Sheet for Healthcare Providers: IncredibleEmployment.be  This test is not yet approved or cleared by the [place] FDA and  has been authorized for detection and/or diagnosis of SARS-CoV-2 by FDA under an Emergency Use Authorization (EUA).  This EUA will remain in effect (meaning this test can  be used) for the duration of  the COVID-19 declaration under Section 564(b)(1) of the Act, 21 U.S.C. section 360bbb-3(b)(1), unless the authorization is terminated or revoked sooner.     Influenza A by PCR NEGATIVE NEGATIVE Final   Influenza B by PCR NEGATIVE NEGATIVE Final    Comment: (NOTE) The Xpert Xpress SARS-CoV-2/FLU/RSV plus assay is intended as an aid in the diagnosis of influenza from Nasopharyngeal swab specimens and should not be used as a sole basis for treatment. Nasal washings and aspirates are unacceptable for Xpert Xpress SARS-CoV-2/FLU/RSV testing.  Fact Sheet for Patients: EntrepreneurPulse.com.au  Fact Sheet for Healthcare Providers: IncredibleEmployment.be  This test is not yet approved or cleared by the [place] FDA and has been authorized for detection and/or diagnosis of SARS-CoV-2 by FDA under an Emergency Use Authorization (EUA). This EUA will remain in effect (meaning this test can be used) for the duration of the COVID-19 declaration under Section 564(b)(1) of the Act, 21 U.S.C. section 360bbb-3(b)(1), unless the authorization is terminated or revoked.  Performed at [facility], [address], [place], [place]  [address]       Imaging Studies   No results found.   Medications   Scheduled Meds:  atorvastatin  80 mg Oral QHS   carvedilol  12.5 mg Oral BID WC   dexamethasone  2 mg Oral Daily   felodipine  10 mg Oral Daily   lidocaine  1 patch Transdermal Q24H   lisinopril  20 mg Oral Daily   pantoprazole  40 mg Oral BID   polyethylene glycol  17 g Oral Daily   senna-docusate  1 tablet Oral BID   sodium chloride flush  3 mL Intravenous Q12H   Continuous Infusions:       LOS: 4 days    Time spent: 20 minutes      Ezekiel Slocumb, DO [facility]  09/17/2020, 3:37 PM      If 7PM-7AM, please contact night-coverage. How to contact the [facility] Attending or Consulting provider [place] or covering provider during after hours [place], for this patient?    Check the care team in [facility] and look for a) attending/consulting TR[facility] provider listed and b) the [facility] team listed Log into [email or web address] and use [place]'s universal password to access. If you do not have the password, please contact the hospital operator. Locate the [facility] provider you are looking for under [facility] and page to a number that you can be directly reached. If you still have difficulty reaching the provider, please page the [facility] (Director on Call) for the Hospitalists listed on amion for assistance.

## 2020-09-17 NOTE — TOC Progression Note (Signed)
Transition of Care [facility]) - Progression Note    Patient Details  Name: Ariana Lee MRN: [id number] Date of Birth: [birth year]  Transition of Care [facility]) CM/SW Start, LCSW Phone Number: 09/17/2020, 12:53 PM  Clinical Narrative:   Called to patient's room. Patient [facility]. Patient is aware she cannot go to SNF until 9/3 due to COVID diagnosis. Insurance Josem Kaufmann will need to be started closer to this time. CSW reached out to Neoma Laming with [facility] to accept bed offer.     Expected Discharge Plan: [place] Barriers to Discharge: Continued Medical Work up  Expected Discharge Plan and Services Expected Discharge Plan: [place]   Discharge Planning Services: CM Consult   Living arrangements for the past 2 months: Single Family Home                                       Social Determinants of Health (SDOH) Interventions    Readmission Risk Interventions No flowsheet data found.

## 2020-09-18 DIAGNOSIS — C3492 Malignant neoplasm of unspecified part of left bronchus or lung: Secondary | ICD-10-CM | POA: Diagnosis not present

## 2020-09-18 DIAGNOSIS — Z7189 Other specified counseling: Secondary | ICD-10-CM | POA: Diagnosis not present

## 2020-09-18 DIAGNOSIS — R531 Weakness: Secondary | ICD-10-CM | POA: Diagnosis not present

## 2020-09-18 LAB — GLUCOSE, CAPILLARY
Glucose-Capillary: 218 mg/dL — ABNORMAL HIGH (ref 70–99)
Glucose-Capillary: 136 mg/dL — ABNORMAL HIGH (ref 70–99)

## 2020-09-18 NOTE — Progress Notes (Signed)
PROGRESS NOTE    Ariana Lee   TDD:[id number]  DOB: 02-18-1940  PCP: Pcp, No    DOA: 09/12/2020 LOS: 5    Brief Narrative / Hospital Course to Date:   Per HPI on Admission: "Ariana Lee is a 80 y.o. 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  Patient has been on chemotherapy but was stopped recently due to worsening performance status.    Neurosurgery consulted, recommend consideration of palliative radiation therapy.  No surgical intervention indicated.  Assessment & Plan   Principal Problem:   Generalized weakness Active Problems:   CAD (coronary artery disease), native coronary artery   Essential hypertension   Hypercholesterolemia   PAD (peripheral artery disease) (HCC)   Acute kidney injury superimposed on CKD (HCC)   Metastatic primary lung cancer, left (HCC)   COVID-19 virus infection   COVID-19 infection Acute on chronic generalized weakness Poor p.o. intake Completed 5 days remdesivir. PT and OT recommendation SNF for rehab, placement pending 10-day isolation period. Not hypoxic on admission, monitor O2 sats and supplement if needed to maintain sat above 90% Continue home oral Decadron 2 mg daily for appetite, nausea and fatigue  AKI superimposed on CKD stage IIIa -AKI present on admission and most likely due to dehydration, prerenal azotemia.  Treated with IV fluids overnight on admission.   825: AKI resolved Resumed lisinopril initially held. --Monitor BMP  Stage IV left upper lobe adenocarcinoma of lung with osseous metastases  -follows with oncology at [facility].  Previously on Keytruda, currently on drug holiday due to poor performance status and progressively worsening over last few months.  CT imaging here shows widespread vertebral metastases with associated spinal canal and foraminal stenosis and resulting lower extremity weakness. 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  On review of 8/18 head CT no comment of subdural hematoma not reported. MRI brain here shows nothing acute, no intracranial metastatic disease, small bilateral subdural hygromas measuring up to 6 mm on the right and 2 mm on left with relatively mild mass-effect on the subjacent right cerebral hemisphere without significant midline shift.  Hypokalemia / Hypomagnesemia -resolved with replacement.  Monitor replace as needed  Constipation -bowel regimen ordered  Right upper back and shoulder pain -suspect due to her recent falls.  On exam right trapezius and cervical paraspinal muscles are hypertonic and mildly tender. -- Trial of as needed Robaxin -- Ice or heat packs okay -- As needed Tylenol  COPD -stable without acute exacerbation.   Continue as needed albuterol.  Hypertension -continued on Coreg.   Lisinopril held on admission due to AKI. Resume lisinopril tomorrow As needed oral hydralazine  CAD -with mild but flat troponin elevation but no chest pain or acute ischemic EKG changes.  Likely demand ischemia in the setting of dehydration and COVID-19 infection.  Continue atorvastatin.  Aspirin on hold  Right renal cell carcinoma -seen on CT imaging.  No acute  issues.  Monitor renal function  Obesity: Body mass index is 33.28 kg/m.  Complicates overall care and prognosis.  Recommend lifestyle modifications including physical activity and diet for weight loss and overall long-term health.   DVT prophylaxis: SCDs Start: 09/12/20 1940   Diet:  Diet Orders (From admission, onward)     Start     Ordered   09/12/20 1939  Diet regular Room service appropriate? Yes; Fluid consistency: Thin  Diet effective now       Question Answer Comment  Room service appropriate? Yes   Fluid consistency: Thin      09/12/20 1938              Code Status: Full Code   Subjective 09/18/20    Patient reports having a headache this morning.  Requests Tylenol for it.  She denies other complaints.  No acute events reported.    Disposition Plan & Communication   Status is: Inpatient  Remains inpatient appropriate because: Needs SNF placement and requires completions of 10 day isolation prior to DC.  Dispo: The patient is from: Home              Anticipated d/c is to: SNF              Patient currently is medically stable for d/c.   Difficult to place patient No    Consults, Procedures, Significant Events   Consultants:  Palliative care Neurosurgery  Procedures:  None  Antimicrobials:  Anti-infectives (From admission, onward)    Start     Dose/Rate Route Frequency Ordered Stop   09/13/20 1000  remdesivir 100 mg in sodium chloride 0.9 % 100 mL IVPB  Status:  Discontinued       See Hyperspace for full Linked Orders Report.   100 mg 200 mL/hr over 30 Minutes Intravenous Daily 09/12/20 1943 09/12/20 1959   09/13/20 1000  remdesivir 100 mg in sodium chloride 0.9 % 100 mL IVPB       See Hyperspace for full Linked Orders Report.   100 mg 200 mL/hr over 30 Minutes Intravenous Daily 09/12/20 1959 09/16/20 1045   09/12/20 2200  remdesivir 200 mg in sodium chloride 0.9% 250 mL IVPB       See Hyperspace for full Linked Orders Report.   200 mg 580  mL/hr over 30 Minutes Intravenous  Once 09/12/20 1959 09/13/20 0053   09/12/20 1945  remdesivir 200 mg in sodium chloride 0.9% 250 mL IVPB  Status:  Discontinued       See Hyperspace for full Linked Orders Report.   200 mg 580 mL/hr over 30 Minutes Intravenous Once 09/12/20 1943 09/12/20 1959         Micro    Objective   Vitals:   09/17/20 2025 09/18/20 0345 09/18/20 0842 09/18/20 0845  BP: 126/71 (!) 150/67 (!) 146/82 (!) 146/68  Pulse: 79 81 81 65  Resp: 18 18  18   Temp: 97.9 F (36.6 C) 98.1 F (36.7 C)  98 F (36.7 C)  TempSrc: Oral Oral  Oral  SpO2: 98%   100%  Weight:      Height:        Intake/Output Summary (Last 24 hours) at 09/18/2020 1800 Last data filed at 09/17/2020 2034 Gross per 24 hour  Intake 5 ml  Output --  Net 5 ml   Filed Weights   09/12/20 1055  Weight: 90.7 kg  Physical Exam:  General exam: awake, alert, no acute distress, obese Respiratory system: clear lungs, normal respiratory effort, on room air. Cardiovascular system: normal S1/S2, RRR Central nervous system: A&O x3. Normal speech, grossly non-focal exam Extremities: Improved nonpitting lower extremity edema   Labs   Data Reviewed: I have personally reviewed following labs and imaging studies  CBC: Recent Labs  Lab 09/13/20 1018 09/14/20 0752 09/15/20 0610 09/16/20 0445 09/17/20 0538  WBC 14.0* 13.2* 10.7* 11.0* 11.9*  NEUTROABS 10.8*  --   --   --   --   HGB 11.9* 11.4* 11.1* 11.4* 11.4*  HCT 36.9 34.5* 32.8* 33.8* 34.7*  MCV 86.4 83.3 82.6 84.7 83.8  PLT 132* 144* 134* 147* 962*   Basic Metabolic Panel: Recent Labs  Lab 09/13/20 1018 09/14/20 0752 09/15/20 0610 09/16/20 0445 09/17/20 0538  NA 141 137 138 139 138  K 3.7 3.5 3.3* 3.9 4.2  CL 104 106 107 109 107  CO2 23 24 25 24 24   GLUCOSE 133* 103* 101* 106* 105*  BUN 22 27* 24* 28* 28*  CREATININE 1.20* 0.87 0.94 0.98 0.96  CALCIUM 8.8* 8.6* 8.1* 8.3* 8.5*  MG 1.7 1.6* 1.9 2.0  --    GFR: Estimated  Creatinine Clearance: 51.2 mL/min (by C-G formula based on SCr of 0.96 mg/dL). Liver Function Tests: Recent Labs  Lab 09/12/20 1350 09/13/20 1018 09/14/20 0752 09/15/20 0610  AST 26 31 18 16   ALT 21 19 18 17   ALKPHOS 192* 281* 268* 255*  BILITOT 1.0 0.9 0.8 0.7  PROT 6.2* 5.5* 5.2* 4.8*  ALBUMIN 3.1* 2.6* 2.6* 2.2*   No results for input(s): LIPASE, AMYLASE in the last 168 hours. No results for input(s): AMMONIA in the last 168 hours. Coagulation Profile: No results for input(s): INR, PROTIME in the last 168 hours. Cardiac Enzymes: Recent Labs  Lab 09/12/20 1350  CKTOTAL 115   BNP (last 3 results) No results for input(s): PROBNP in the last 8760 hours. HbA1C: No results for input(s): HGBA1C in the last 72 hours. CBG: Recent Labs  Lab 09/12/20 1513 09/18/20 1300 09/18/20 1735  GLUCAP 105* 218* 136*   Lipid Profile: No results for input(s): CHOL, HDL, LDLCALC, TRIG, CHOLHDL, LDLDIRECT in the last 72 hours. Thyroid Function Tests: No results for input(s): TSH, T4TOTAL, FREET4, T3FREE, THYROIDAB in the last 72 hours. Anemia Panel: No results for input(s): VITAMINB12, FOLATE, FERRITIN, TIBC, IRON, RETICCTPCT in the last 72 hours. Sepsis Labs: Recent Labs  Lab 09/12/20 1802  PROCALCITON 0.56    Recent Results (from the past 240 hour(s))  Resp Panel by RT-PCR (Flu A&B, Covid) Nasopharyngeal Swab     Status: Abnormal   Collection Time: 09/12/20  3:12 PM   Specimen: Nasopharyngeal Swab; Nasopharyngeal(NP) swabs in vial transport medium  Result Value Ref Range Status   SARS Coronavirus 2 by RT PCR POSITIVE (A) NEGATIVE Final    Comment: RESULT CALLED TO, READ BACK BY AND VERIFIED WITH: KATIE RAND AT 2297 ON 09/12/20 BY SS (NOTE) SARS-CoV-2 target nucleic acids are DETECTED.  The SARS-CoV-2 RNA is generally detectable in upper respiratory specimens during the acute phase of infection. Positive results are indicative of the presence of the identified virus, but do  not rule out bacterial infection or co-infection with other pathogens not detected by the test. Clinical correlation with patient history and other diagnostic information is necessary to determine patient infection status. The expected result is Negative.  Fact Sheet for Patients: EntrepreneurPulse.com.au  Fact Sheet for Healthcare  Providers: IncredibleEmployment.be  This test is not yet approved or cleared by the [place] and  has been authorized for detection and/or diagnosis of SARS-CoV-2 by FDA under an Emergency Use Authorization (EUA).  This EUA will remain in effect (meaning this test can  be used) for the duration of  the COVID-19 declaration under Section 564(b)(1) of the Act, 21 U.S.C. section 360bbb-3(b)(1), unless the authorization is terminated or revoked sooner.     Influenza A by PCR NEGATIVE NEGATIVE Final   Influenza B by PCR NEGATIVE NEGATIVE Final    Comment: (NOTE) The Xpert Xpress SARS-CoV-2/FLU/RSV plus assay is intended as an aid in the diagnosis of influenza from Nasopharyngeal swab specimens and should not be used as a sole basis for treatment. Nasal washings and aspirates are unacceptable for Xpert Xpress SARS-CoV-2/FLU/RSV testing.  Fact Sheet for Patients: EntrepreneurPulse.com.au  Fact Sheet for Healthcare Providers: IncredibleEmployment.be  This test is not yet approved or cleared by the [place] FDA and has been authorized for detection and/or diagnosis of SARS-CoV-2 by FDA under an Emergency Use Authorization (EUA). This EUA will remain in effect (meaning this test can be used) for the duration of the COVID-19 declaration under Section 564(b)(1) of the Act, 21 U.S.C. section 360bbb-3(b)(1), unless the authorization is terminated or revoked.  Performed at [facility], [address], [place], [place] [address]       Imaging Studies   No  results found.   Medications   Scheduled Meds:  atorvastatin  80 mg Oral QHS   carvedilol  12.5 mg Oral BID WC   dexamethasone  2 mg Oral Daily   felodipine  10 mg Oral Daily   lidocaine  1 patch Transdermal Q24H   lisinopril  20 mg Oral Daily   pantoprazole  40 mg Oral BID   polyethylene glycol  17 g Oral Daily   senna-docusate  1 tablet Oral BID   sodium chloride flush  3 mL Intravenous Q12H   Continuous Infusions:       LOS: 5 days    Time spent: 20 minutes      Ezekiel Slocumb, DO [facility]  09/18/2020, 6:00 PM      If 7PM-7AM, please contact night-coverage. How to contact the [facility] Attending or Consulting provider [place] or covering provider during after hours [place], for this patient?    Check the care team in [facility] and look for a) attending/consulting TR[facility] provider listed and b) the [facility] team listed Log into [email or web address] and use [place] universal password to access. If you do not have the password, please contact the hospital operator. Locate the [facility] provider you are looking for under [facility] and page to a number that you can be directly reached. If you still have difficulty reaching the provider, please page the [facility] (Director on Call) for the Hospitalists listed on amion for assistance.

## 2020-09-18 NOTE — Plan of Care (Signed)
  Problem: Clinical Measurements: Goal: Ability to maintain clinical measurements within normal limits will improve Outcome: Progressing Goal: Will remain free from infection Outcome: Progressing Goal: Diagnostic test results will improve Outcome: Progressing Goal: Respiratory complications will improve Outcome: Progressing   Problem: Pain Managment: Goal: General experience of comfort will improve Outcome: Progressing   Pt is alert and oriented. V/S stable. Denies any pain or SOB.

## 2020-09-18 NOTE — Progress Notes (Signed)
Occupational Therapy Treatment Patient Details Name: Ariana Lee MRN: [id number] DOB: [birth year] Today's Date: 09/18/2020    History of present illness Pt admitted for weakness with repeated falls. 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   OT comments  Pt seen for OT/PT co-treatment on this date. Upon arrival to room, pt appeared to have BM in bed. While supine, pt required MAX A for posterior peri-care, SET-UP assist for frontal peri-care, and MIN A for UB dressing. Pt continues to present with decreased activity tolerance, strength, and balance. Due to these functional impairments, pt requires increased assist for bed-level ADLs, MIN A for bed mobility, and MOD A+2 for stand pivot transfers with RW. Attempted to obtained orthostatic vitals this date:  Vitals: - Supine (at beginning of session): BP 99/55, HR 75, SpO2 100 - Supine (after rolling/toilet hygiene): BP 114/53, HR 72 SpO2 100 - Sitting EOB (immediately following supine>sit transfer): BP 95/55, HR 87, SpO2 100 - Sitting EOB (after 2 mins of therapy exercises): BP 109/65 - Unable to obtain standing vitals - Sitting in chair (at end of session): BP 108/60, HR 72  Pt is making good progress toward goals and continues to benefit from skilled OT services to maximize return to PLOF and minimize risk of future falls, injury, caregiver burden, and readmission. Will continue to follow POC. Discharge recommendation remains appropriate.     Follow Up Recommendations  SNF;Supervision - Intermittent    Equipment Recommendations  Other (comment) (defer to next venue of care)       Precautions / Restrictions Precautions Precautions: Fall Restrictions Weight Bearing Restrictions: No       Mobility Bed Mobility Overal bed mobility: Needs Assistance Bed Mobility: Rolling;Supine to Sit Rolling: Min guard   Supine to sit: Min assist     General bed mobility comments: Slight min-A for  bringing trunk upright    Transfers Overall transfer level: Needs assistance Equipment used: Rolling walker (2 wheeled) Transfers: Sit to/from [facility] Sit to Stand: Mod assist Stand pivot transfers: Mod assist;+2 physical assistance       General transfer comment: L knee buckling during bed > recliner transfer    Balance Overall balance assessment: Needs assistance;History of Falls Sitting-balance support: Feet supported;Single extremity supported Sitting balance-Leahy Scale: Fair Sitting balance - Comments: MIN GUARD for static sitting balance at EOB   Standing balance support: Bilateral upper extremity supported;During functional activity Standing balance-Leahy Scale: Poor Standing balance comment: requires MOD A+2 during stand pivot transfer d/t L knee buckle                           ADL either performed or assessed with clinical judgement   ADL Overall ADL's : Needs assistance/impaired     Grooming: Wash/dry hands;Set up;Bed level           Upper Body Dressing : Minimal assistance;Bed level Upper Body Dressing Details (indicate cue type and reason): to don/doff hospital gown         Toileting- Clothing Manipulation and Hygiene: Moderate assistance;Bed level Toileting - Clothing Manipulation Details (indicate cue type and reason): Pt able to perform frontal peri-care with SET-UP assist. Requires MAX A for posterior peri-care     Functional mobility during ADLs: Moderate assistance;+2 for physical assistance;Rolling walker (for stand pivot transfer bed>chair)        Cognition Arousal/Alertness: Lethargic;Awake/alert Behavior During Therapy: WFL for tasks assessed/performed Overall Cognitive Status: History  of cognitive impairments - at baseline                                 General Comments: Pt oriented to self and place (aware that she is in hospital). Disoriented to date/situation. Intermittently closing eyes  while supine in bed/recliner, however increased alertness when sitting EOB/standing. Pleasant and agreeable throughout.              General Comments Orthostatic vitals  - (supine) BP: 99/55, HR: 75, SpO2 100; (supine after rolling) BP: 114/53, HR 72 SpO2 100; (sitting EOB) BP: 95/55, HR 87, SpO2 100; (sitting EOB after 2 mins & LE exercises) BP: 109/65; (Seated in chair) BP 108/60 HR 72    Pertinent Vitals/ Pain       Pain Assessment: No/denies pain         Frequency  Min 2X/week        Progress Toward Goals  OT Goals(current goals can now be found in the care plan section)  Progress towards OT goals: Progressing toward goals  Acute Rehab OT Goals Patient Stated Goal: "Get better so I can go home." OT Goal Formulation: With patient Time For Goal Achievement: 09/27/20 Potential to Achieve Goals: [place] Discharge plan remains appropriate;Frequency remains appropriate    Co-evaluation    PT/OT/SLP Co-Evaluation/Treatment: Yes Reason for Co-Treatment: For patient/therapist safety;To address functional/ADL transfers PT goals addressed during session: Mobility/safety with mobility OT goals addressed during session: ADL's and self-care      AM-PAC OT "6 Clicks" Daily Activity     Outcome Measure   Help from another person eating meals?: None Help from another person taking care of personal grooming?: A Little Help from another person toileting, which includes using toliet, bedpan, or urinal?: A Lot Help from another person bathing (including washing, rinsing, drying)?: A Lot Help from another person to put on and taking off regular upper body clothing?: A Little Help from another person to put on and taking off regular lower body clothing?: Total 6 Click Score: 15    End of Session Equipment Utilized During Treatment: Gait belt;Rolling walker  OT Visit Diagnosis: Unsteadiness on feet (R26.81);Muscle weakness (generalized) (M62.81)   Activity Tolerance Patient  tolerated treatment well   Patient Left in chair;with call bell/phone within reach;with chair alarm set   Nurse Communication Mobility status;Other (comment) (orthostatic vitals)        Time: 1540-0867 OT Time Calculation (min): 41 min  Charges: OT General Charges $OT Visit: 1 Visit OT Treatments $Self Care/Home Management : 23-37 mins  Fredirick Maudlin, OTR/L [place]

## 2020-09-18 NOTE — Progress Notes (Signed)
Physical Therapy Treatment Patient Details Name: Ariana Lee MRN: [id number] DOB: [birth year] Today's Date: 09/18/2020    History of Present Illness Pt admitted for weakness with repeated falls. 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 [facility] sx consult recs conservative treatment and PT cleared for mobility this date.    PT Comments   -  OT/PT co-treat on this day to maximize patient function due to fatigue and decreased endurance. Pt lethargic in bed, awakes to verbal and tactile cues. Pleasant, cooperative w/ therapy and denies pain. Pt assisted w/ rolling for clean-up, gown change following BM x2 in bed and recliner w/ min-guard. Min-A for trunk assit for supine > sit transition. Pt maintained sitting position for approximately 5 minutes, denies sx of lightheadedness/dizziness in upright positioning (see vitals below). Sit <> stand requires mod-A w/ RW.  Stand-pivot bed > recliner w/ mod-A + 2 for safety and physical assist to support L Knee buckling. Pt comfortable in chair, vitals stable. Skilled PT intervention is indicated to address deficits in function, mobility, and to return to PLOF as able.  Discharge recommendations remain SNF.  Vitals - (supine) BP: 99/55, HR: 75, SpO2 100, (supine after rolling) BP: 114/53, HR 72 SpO2 100  - (sitting) BP: 95/55, HR 87, SpO2 100; (sitting after 2 mins & LE exercises) BP: 109/65 - (Seated in chair) BP 108/60 HR72   Follow Up Recommendations  SNF     Equipment Recommendations  Other (comment) (TBD next venue of care)    Recommendations for Other Services       Precautions / Restrictions Precautions Precautions: Fall Restrictions Weight Bearing Restrictions: No    Mobility  Bed Mobility Overal bed mobility: Needs Assistance Bed Mobility: Rolling;Supine to Sit Rolling: Min guard   Supine to sit: Min assist     General bed mobility comments: Slight min-A for bringing trunk upright     Transfers Overall transfer level: Needs assistance Equipment used: Rolling walker (2 wheeled) Transfers: Sit to/from [facility] Sit to Stand: Mod assist Stand pivot transfers: Mod assist;+2 physical assistance       General transfer comment: L knee buckling during bed > recliner transfer, min-A for L knee blocking; mod-A for stepping to recliner  Ambulation/Gait                 Stairs             Wheelchair Mobility    Modified Rankin (Stroke Patients Only)       Balance Overall balance assessment: Needs assistance;History of Falls Sitting-balance support: Feet supported;Single extremity supported   Sitting balance - Comments: Static sitting - min-gaurd, cannot tolerate challenge, but does not require BUE support   Standing balance support: Bilateral upper extremity supported Standing balance-Leahy Scale: Poor Standing balance comment: L knee buckles w/o physical assist                            Cognition Arousal/Alertness: Awake/alert Behavior During Therapy: WFL for tasks assessed/performed Overall Cognitive Status: History of cognitive impairments - at baseline                                 General Comments: Cooperative, agreeable, oriented to name and situation      Exercises General Exercises - Lower Extremity Long Arc Quad: AROM;Both;10 reps;Seated Other Exercises Other Exercises: Pt had  BM in bed and recliner, assisted OT with patient clean-up; pt able to roll L, R x 4 w/ min-gaurd    General Comments        Pertinent Vitals/Pain Pain Assessment: No/denies pain    Home Living                      Prior Function            PT Goals (current goals can now be found in the care plan section) Acute Rehab PT Goals Patient Stated Goal: "Get better so I can go home." PT Goal Formulation: With patient Time For Goal Achievement: 09/27/20 Potential to Achieve Goals: Good Progress  towards PT goals: Progressing toward goals    Frequency    Min 2X/week      PT Plan Current plan remains appropriate    Co-evaluation PT/OT/SLP Co-Evaluation/Treatment: Yes Reason for Co-Treatment: For patient/therapist safety;To address functional/ADL transfers PT goals addressed during session: Mobility/safety with mobility OT goals addressed during session: ADL's and self-care      AM-PAC PT "6 Clicks" Mobility   Outcome Measure  Help needed turning from your back to your side while in a flat bed without using bedrails?: A Little Help needed moving from lying on your back to sitting on the side of a flat bed without using bedrails?: A Little Help needed moving to and from a bed to a chair (including a wheelchair)?: A Lot Help needed standing up from a chair using your arms (e.g., wheelchair or bedside chair)?: A Lot Help needed to walk in hospital room?: A Lot Help needed climbing 3-5 steps with a railing? : Total 6 Click Score: 13    End of Session Equipment Utilized During Treatment: Gait belt Activity Tolerance: Patient tolerated treatment well Patient left: in chair;with call bell/phone within reach;with chair alarm set Nurse Communication: Mobility status PT Visit Diagnosis: Unsteadiness on feet (R26.81);Repeated falls (R29.6);Muscle weakness (generalized) (M62.81);History of falling (Z91.81);Difficulty in walking, not elsewhere classified (R26.2)     Time: 7824-2353 PT Time Calculation (min) (ACUTE ONLY): 41 min  Charges:                        [employer], SPT

## 2020-09-18 NOTE — Progress Notes (Signed)
Daily Progress Note   Patient Name: Ariana Lee       Date: 09/18/2020 DOB: 1940-03-13  Age: 80 y.o. MRN#: [id number] Attending Physician: Ezekiel Slocumb, DO Primary Care Physician: Pcp, No Admit Date: 09/12/2020  Reason for Consultation/Follow-up: Establishing goals of care  Patient Profile/HPI:  80 y.o. 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 Workup reveals Covid +.   Subjective: Chart reviewed- noted patient is followed by Palliative medicine at [facility]. Per their last note chemotherapy was give that day with plan for 3 week holiday to see improvement in her fatigue and appetite.  She was last seen on 09/11/20 by the [facility] Palliative time and at that time goals were as follows:  per 09/11/20 note from Care Everywhere by  Yetta Flock, FNP:  "Goals of care: -Goals reassessed on 09/11/2020 visit. Goal is to continue moving more. Wants to improve her functional status. Not ready for hospice yet and wants to continue working with PT. -At prior visits: Does not want to suffer. Does not want to be a burden to her children -MOLST completed: YES on 06/06/20: -DO NOT INTUBATE. Limited hospitalization, antibiotics if indicated, no IV fluids and no feeding tube." "At prior visits: -Code Status: Discussed CODE STATUS and does not wish to be intubated or to have CPR. Receptive to the DNR/DNI status. Shares she does not want to be a burden to her family. "  On my eval today patient is sitting up in bed, awake, and alert. I reviewed the above discussion with her. She agrees  that her current GOC are that she wishes to continue to work with PT and hopes to regain some of her appetite and strength and resume chemotherapy. She does feel she is eating a great deal better than before.   As for her code status- she shares that after that meeting she spoke with her son and he wants her to be full code status. She confirmed that her preference would be DNR, however, she wants to go along with her son's wishes.   Review of Systems  Respiratory:  Negative for cough.  Musculoskeletal:  Negative for myalgias.    Physical Exam Vitals and nursing note reviewed.  Constitutional:      Appearance: Normal appearance.  Skin:    General: Skin is warm and dry.  Neurological:     Mental Status: She is alert and oriented to person, place, and time.  Psychiatric:        Mood and Affect: Mood normal.        Behavior: Behavior normal.            Vital Signs: BP (!) 146/68 (BP Location: Right Arm)   Pulse 65   Temp 98 F (36.7 C) (Oral)   Resp 18   Ht 5\' 5"  (1.651 m)   Wt 90.7 kg   SpO2 100%   BMI 33.28 kg/m  SpO2: SpO2: 100 % O2 Device: O2 Device: Room Air O2 Flow Rate:    Intake/output summary:  Intake/Output Summary (Last 24 hours) at 09/18/2020 1333 Last data filed at 09/17/2020 2034 Gross per 24 hour  Intake 5 ml  Output --  Net 5 ml   LBM: Last BM Date: 09/15/20 Baseline Weight: Weight: 90.7 kg Most recent weight: Weight: 90.7 kg       Palliative Assessment/Data: PPS: 50%      Patient Active Problem List   Diagnosis Date Noted   Generalized weakness 09/12/2020   Acute kidney injury superimposed on CKD ([place]) 09/12/2020   Metastatic primary lung cancer, left ([place]) 09/12/2020   COVID-19 virus infection 09/12/2020   Weakness of both lower limbs 09/09/2014   CAD (coronary artery disease), native coronary artery 07/26/2003   Hypercholesterolemia 05/27/2003   PAD (peripheral artery disease) ([place]) 05/27/2003   Essential hypertension 12/26/2001     Palliative Care Assessment & Plan    Assessment/Recommendations/Plan  Continue current scope She is in agreement with d/c to SNF Recommend she continues to followup with [facility] Palliative at discharge   Code Status: Full code  Prognosis:  Unable to determine  Discharge Planning: [place] for rehab with Palliative care service follow-up  Care plan was discussed with patient.   Thank you for allowing the Palliative Medicine Team to assist in the care of this patient.  Total time: 36 mins Prolonged billing: No     Greater than 50%  of this time was spent counseling and coordinating care related to the above assessment and plan.  Mariana Kaufman, AGNP-C Palliative Medicine   Please contact Palliative Medicine Team phone at [phone] for questions and concerns.

## 2020-09-19 DIAGNOSIS — R531 Weakness: Secondary | ICD-10-CM | POA: Diagnosis not present

## 2020-09-19 LAB — MAGNESIUM: Magnesium: 1.9 mg/dL (ref 1.7–2.4)

## 2020-09-19 LAB — BASIC METABOLIC PANEL
Anion gap: 8 (ref 5–15)
BUN: 23 mg/dL (ref 8–23)
CO2: 22 mmol/L (ref 22–32)
Calcium: 8.2 mg/dL — ABNORMAL LOW (ref 8.9–10.3)
Chloride: 106 mmol/L (ref 98–111)
Creatinine, Ser: 0.84 mg/dL (ref 0.44–1.00)
GFR, Estimated: >60 mL/min (ref >60)
Glucose, Bld: 87 mg/dL (ref 70–99)
Potassium: 4.2 mmol/L (ref 3.5–5.1)
Sodium: 136 mmol/L (ref 135–145)

## 2020-09-19 LAB — C-REACTIVE PROTEIN: CRP: 5.9 mg/dL — ABNORMAL HIGH (ref <1.0)

## 2020-09-19 NOTE — Progress Notes (Signed)
Chaplain Maggie made visitation over the phone with pt. She expressed how important it is for staff to communicate with her son during her hospitalization. She noted gratitude for the team speaking to him today. Chaplain offered listening and general support to pt who shared that she feels cared for.

## 2020-09-19 NOTE — Progress Notes (Addendum)
PROGRESS NOTE    Ariana Lee   WYO:[id number]  DOB: [birth year]  PCP: Pcp, No    DOA: 09/12/2020 LOS: 6    Brief Narrative / Hospital Course to Date:   Per HPI on Admission: "Ariana Lee is a 80 y.o. 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  Patient has been on chemotherapy but was stopped recently due to worsening performance status.    Neurosurgery consulted, recommend consideration of palliative radiation therapy.  No surgical intervention indicated.  Assessment & Plan   Principal Problem:   Generalized weakness Active Problems:   CAD (coronary artery disease), native coronary artery   Essential hypertension   Hypercholesterolemia   PAD (peripheral artery disease) (HCC)   Acute kidney injury superimposed on CKD (HCC)   Metastatic primary lung cancer, left (HCC)   COVID-19 virus infection   COVID-19 infection Acute on chronic generalized weakness Poor p.o. intake Completed 5 days remdesivir. PT and OT recommendation SNF for rehab, placement pending 10-day isolation period. Not hypoxic on admission, monitor O2 sats and supplement if needed to maintain sat above 90% Continue home oral Decadron 2 mg daily for appetite, nausea and fatigue  AKI superimposed on CKD stage IIIa -AKI present on admission and most likely due to dehydration, prerenal azotemia.  Treated with IV fluids overnight on admission.   8/25: AKI resolved Resumed lisinopril initially held. --Monitor BMP  Stage IV left upper lobe adenocarcinoma of lung with osseous metastases  -follows with oncology at [facility].  Previously on Keytruda, currently on drug holiday due to poor performance status and progressively worsening over last few months.  CT imaging here shows widespread vertebral metastases with associated spinal canal and foraminal stenosis and resulting lower extremity weakness. 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  On review of 8/18 head CT no comment of subdural hematoma not reported. MRI brain here shows nothing acute, no intracranial metastatic disease, small bilateral subdural hygromas measuring up to 6 mm on the right and 2 mm on left with relatively mild mass-effect on the subjacent right cerebral hemisphere without significant midline shift.  Hypokalemia / Hypomagnesemia -resolved with replacement.  Monitor replace as needed  Constipation -bowel regimen ordered  Right upper back and shoulder pain -suspect due to her recent falls.  On exam right trapezius and cervical paraspinal muscles are hypertonic and mildly tender. -- Trial of as needed Robaxin -- Ice or heat packs okay -- As needed Tylenol  COPD -stable without acute exacerbation.   Continue as needed albuterol.  Hypertension -continued on Coreg.   Lisinopril held on admission due to AKI. Resume lisinopril tomorrow As needed oral hydralazine  CAD -with mild but flat troponin elevation but no chest pain or acute ischemic EKG changes.  Likely demand ischemia in the setting of dehydration and COVID-19 infection.  Continue atorvastatin.  Aspirin on hold  Right renal cell carcinoma -seen on CT imaging.  No  acute issues.  Monitor renal function  Obesity: Body mass index is 33.28 kg/m.  Complicates overall care and prognosis.  Recommend lifestyle modifications including physical activity and diet for weight loss and overall long-term health.   DVT prophylaxis: SCDs Start: 09/12/20 1940   Diet:  Diet Orders (From admission, onward)     Start     Ordered   09/12/20 1939  Diet regular Room service appropriate? Yes; Fluid consistency: Thin  Diet effective now       Question Answer Comment  Room service appropriate? Yes   Fluid consistency: Thin      09/12/20 1938              Code Status: Full Code   Subjective 09/19/20    Patient reports feeling well today.  Headache yesterday resolved.  Says appetite slowly improving.  Feels cold this AM due to room temp, denies feeling ill or feverish.  No other acute complaints or acute events reported.    Disposition Plan & Communication   Status is: Inpatient  Remains inpatient appropriate because: Needs SNF placement and requires completions of 10 day isolation prior to DC.  Expect d/c to SNF on 9/3.  Dispo: The patient is from: Home              Anticipated d/c is to: SNF              Patient currently is medically stable for d/c.   Difficult to place patient No    Consults, Procedures, Significant Events   Consultants:  Palliative care Neurosurgery  Procedures:  None  Antimicrobials:  Anti-infectives (From admission, onward)    Start     Dose/Rate Route Frequency Ordered Stop   09/13/20 1000  remdesivir 100 mg in sodium chloride 0.9 % 100 mL IVPB  Status:  Discontinued       See Hyperspace for full Linked Orders Report.   100 mg 200 mL/hr over 30 Minutes Intravenous Daily 09/12/20 1943 09/12/20 1959   09/13/20 1000  remdesivir 100 mg in sodium chloride 0.9 % 100 mL IVPB       See Hyperspace for full Linked Orders Report.   100 mg 200 mL/hr over 30 Minutes Intravenous Daily 09/12/20 1959 09/16/20 1045   09/12/20 2200   remdesivir 200 mg in sodium chloride 0.9% 250 mL IVPB       See Hyperspace for full Linked Orders Report.   200 mg 580 mL/hr over 30 Minutes Intravenous  Once 09/12/20 1959 09/13/20 0053   09/12/20 1945  remdesivir 200 mg in sodium chloride 0.9% 250 mL IVPB  Status:  Discontinued       See Hyperspace for full Linked Orders Report.   200 mg 580 mL/hr over 30 Minutes Intravenous Once 09/12/20 1943 09/12/20 1959         Micro    Objective   Vitals:   09/18/20 1851 09/18/20 2029 09/19/20 0553 09/19/20 0923  BP: 126/67 131/71 139/63 (!) 141/69  Pulse: 81 78 73 70  Resp: 18 16 14 18   Temp: 97.9 F (36.6 C) 98.1 F (36.7 C) 97.9 F (36.6 C) 98.1 F (36.7 C)  TempSrc: Oral Oral  Oral  SpO2: 100% 99% 100% 100%  Weight:      Height:        Intake/Output Summary (Last 24 hours) at 09/19/2020 1433 Last data filed at 09/19/2020 0900 Gross per 24 hour  Intake --  Output 602 ml  Net -602 ml   Filed Weights   09/12/20 1055  Weight: 90.7 kg    Physical Exam:  General exam: awake sitting up in bed, alert, no acute distress, obese Respiratory system: normal respiratory effort, on room air. Cardiovascular system: normal S1/S2, RRR Central nervous system: A&O x3. Normal speech, grossly non-focal exam Extremities: Improved nonpitting lower extremity edema, SCD's in place   Labs   Data Reviewed: I have personally reviewed following labs and imaging studies  CBC: Recent Labs  Lab 09/13/20 1018 09/14/20 0752 09/15/20 0610 09/16/20 0445 09/17/20 0538  WBC 14.0* 13.2* 10.7* 11.0* 11.9*  NEUTROABS 10.8*  --   --   --   --   HGB 11.9* 11.4* 11.1* 11.4* 11.4*  HCT 36.9 34.5* 32.8* 33.8* 34.7*  MCV 86.4 83.3 82.6 84.7 83.8  PLT 132* 144* 134* 147* 962*   Basic Metabolic Panel: Recent Labs  Lab 09/13/20 1018 09/14/20 0752 09/15/20 0610 09/16/20 0445 09/17/20 0538 09/19/20 0634  NA 141 137 138 139 138 136  K 3.7 3.5 3.3* 3.9 4.2 4.2  CL 104 106 107 109 107 106  CO2  23 24 25 24 24 22   GLUCOSE 133* 103* 101* 106* 105* 87  BUN 22 27* 24* 28* 28* 23  CREATININE 1.20* 0.87 0.94 0.98 0.96 0.84  CALCIUM 8.8* 8.6* 8.1* 8.3* 8.5* 8.2*  MG 1.7 1.6* 1.9 2.0  --  1.9   GFR: Estimated Creatinine Clearance: 58.5 mL/min (by C-G formula based on SCr of 0.84 mg/dL). Liver Function Tests: Recent Labs  Lab 09/13/20 1018 09/14/20 0752 09/15/20 0610  AST 31 18 16   ALT 19 18 17   ALKPHOS 281* 268* 255*  BILITOT 0.9 0.8 0.7  PROT 5.5* 5.2* 4.8*  ALBUMIN 2.6* 2.6* 2.2*   No results for input(s): LIPASE, AMYLASE in the last 168 hours. No results for input(s): AMMONIA in the last 168 hours. Coagulation Profile: No results for input(s): INR, PROTIME in the last 168 hours. Cardiac Enzymes: No results for input(s): CKTOTAL, CKMB, CKMBINDEX, TROPONINI in the last 168 hours.  BNP (last 3 results) No results for input(s): PROBNP in the last 8760 hours. HbA1C: No results for input(s): HGBA1C in the last 72 hours. CBG: Recent Labs  Lab 09/12/20 1513 09/18/20 1300 09/18/20 1735  GLUCAP 105* 218* 136*   Lipid Profile: No results for input(s): CHOL, HDL, LDLCALC, TRIG, CHOLHDL, LDLDIRECT in the last 72 hours. Thyroid Function Tests: No results for input(s): TSH, T4TOTAL, FREET4, T3FREE, THYROIDAB in the last 72 hours. Anemia Panel: No results for input(s): VITAMINB12, FOLATE, FERRITIN, TIBC, IRON, RETICCTPCT in the last 72 hours. Sepsis Labs: Recent Labs  Lab 09/12/20 1802  PROCALCITON 0.56    Recent Results (from the past 240 hour(s))  Resp Panel by RT-PCR (Flu A&B, Covid) Nasopharyngeal Swab     Status: Abnormal   Collection Time: 09/12/20  3:12 PM   Specimen: Nasopharyngeal Swab; Nasopharyngeal(NP) swabs in vial transport medium  Result Value Ref Range Status   SARS Coronavirus 2 by RT PCR POSITIVE (A) NEGATIVE Final    Comment: RESULT CALLED TO, READ BACK BY AND VERIFIED WITH: KATIE RAND AT 8366 ON 09/12/20 BY SS (NOTE) SARS-CoV-2 target nucleic  acids are DETECTED.  The SARS-CoV-2 RNA is generally detectable in upper respiratory specimens during the acute phase of infection. Positive results are indicative of the presence of the identified virus, but do not rule out bacterial infection or co-infection with other pathogens not detected by the  test. Clinical correlation with patient history and other diagnostic information is necessary to determine patient infection status. The expected result is Negative.  Fact Sheet for Patients: EntrepreneurPulse.com.au  Fact Sheet for Healthcare Providers: IncredibleEmployment.be  This test is not yet approved or cleared by the [place] FDA and  has been authorized for detection and/or diagnosis of SARS-CoV-2 by FDA under an Emergency Use Authorization (EUA).  This EUA will remain in effect (meaning this test can  be used) for the duration of  the COVID-19 declaration under Section 564(b)(1) of the Act, 21 U.S.C. section 360bbb-3(b)(1), unless the authorization is terminated or revoked sooner.     Influenza A by PCR NEGATIVE NEGATIVE Final   Influenza B by PCR NEGATIVE NEGATIVE Final    Comment: (NOTE) The Xpert Xpress SARS-CoV-2/FLU/RSV plus assay is intended as an aid in the diagnosis of influenza from Nasopharyngeal swab specimens and should not be used as a sole basis for treatment. Nasal washings and aspirates are unacceptable for Xpert Xpress SARS-CoV-2/FLU/RSV testing.  Fact Sheet for Patients: EntrepreneurPulse.com.au  Fact Sheet for Healthcare Providers: IncredibleEmployment.be  This test is not yet approved or cleared by the [place] FDA and has been authorized for detection and/or diagnosis of SARS-CoV-2 by FDA under an Emergency Use Authorization (EUA). This EUA will remain in effect (meaning this test can be used) for the duration of the COVID-19 declaration under Section 564(b)(1) of  the Act, 21 U.S.C. section 360bbb-3(b)(1), unless the authorization is terminated or revoked.  Performed at [facility], [address] [place], [place] [address]       Imaging Studies   No results found.   Medications   Scheduled Meds:  atorvastatin  80 mg Oral QHS   carvedilol  12.5 mg Oral BID WC   dexamethasone  2 mg Oral Daily   felodipine  10 mg Oral Daily   lidocaine  1 patch Transdermal Q24H   lisinopril  20 mg Oral Daily   pantoprazole  40 mg Oral BID   polyethylene glycol  17 g Oral Daily   senna-docusate  1 tablet Oral BID   sodium chloride flush  3 mL Intravenous Q12H   Continuous Infusions:       LOS: 6 days    Time spent: 20 minutes      Ezekiel Slocumb, DO [facility]  09/19/2020, 2:33 PM      If 7PM-7AM, please contact night-coverage. How to contact the [facility] Attending or Consulting provider [place] or covering provider during after hours [place], for this patient?    Check the care team in [facility] and look for a) attending/consulting TRH provider listed and b) the [facility] team listed Log into [email or web address] and use [place]'s universal password to access. If you do not have the password, please contact the hospital operator. Locate the [facility] provider you are looking for under [facility] and page to a number that you can be directly reached. If you still have difficulty reaching the provider, please page the [facility] (Director on Call) for the Hospitalists listed on amion for assistance.

## 2020-09-19 NOTE — TOC Progression Note (Signed)
Transition of Care [facility]) - Progression Note    Patient Details  Name: Ariana Lee MRN: [id number] Date of Birth: 07-12-1940  Transition of Care [facility]) CM/SW Contact  Beverly Sessions, RN Phone Number: 09/19/2020, 9:15 AM  Clinical Narrative:    Message sent to Hilda Blades at [facility] to confirm she received notification that patient accepted bed, and determine if they can admit patient Saturday 9/3.  Awaiting response    Expected Discharge Plan: [place] Barriers to Discharge: Continued Medical Work up  Expected Discharge Plan and Services Expected Discharge Plan: [place]   Discharge Planning Services: CM Consult   Living arrangements for the past 2 months: Single Family Home                                       Social Determinants of Health (SDOH) Interventions    Readmission Risk Interventions No flowsheet data found.

## 2020-09-20 DIAGNOSIS — R531 Weakness: Secondary | ICD-10-CM | POA: Diagnosis not present

## 2020-09-20 LAB — CBC
HCT: 32.3 % — ABNORMAL LOW (ref 36.0–46.0)
Hemoglobin: 10.8 g/dL — ABNORMAL LOW (ref 12.0–15.0)
MCH: 27.5 pg (ref 26.0–34.0)
MCHC: 33.4 g/dL (ref 30.0–36.0)
MCV: 82.2 fL (ref 80.0–100.0)
Platelets: 168 10*3/uL (ref 150–400)
RBC: 3.93 MIL/uL (ref 3.87–5.11)
RDW: 17 % — ABNORMAL HIGH (ref 11.5–15.5)
WBC: 12.7 10*3/uL — ABNORMAL HIGH (ref 4.0–10.5)
nRBC: 0 % (ref 0.0–0.2)

## 2020-09-20 LAB — C-REACTIVE PROTEIN: CRP: 5.1 mg/dL — ABNORMAL HIGH (ref <1.0)

## 2020-09-20 NOTE — TOC Progression Note (Addendum)
Transition of Care [facility]) - Progression Note    Patient Details  Name: Ariana Lee MRN: [id number] Date of Birth: 12-Sep-1940  Transition of Care [facility]) CM/SW Contact  Beverly Sessions, RN Phone Number: 09/20/2020, 9:53 AM  Clinical Narrative:     Damaris Schooner with Hilda Blades at [facility].  She confirms they can admit on Saturday 9/3.  However they would need the DC summary on Friday, in order to obtain the patient's medication.  If this is not an option patient would likely need to stay till Tuesday until the facility's pharmacy opens after the holiday  If MD in agreement will start auth tomorrow    10:00 MD in agreement and will complete discharge summary on Friday  Expected Discharge Plan: [place] Barriers to Discharge: Continued Medical Work up  Expected Discharge Plan and Services Expected Discharge Plan: [place]   Discharge Planning Services: CM Consult   Living arrangements for the past 2 months: Single Family Home                                       Social Determinants of Health (SDOH) Interventions    Readmission Risk Interventions No flowsheet data found.

## 2020-09-20 NOTE — Progress Notes (Signed)
Occupational Therapy Treatment Patient Details Name: TRENIECE HOLSCLAW MRN: [id number] DOB: [birth year] Today's Date: 09/20/2020    History of present illness Pt admitted for weakness with repeated falls. 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   OT comments  Pt seen for OT treatment on this date. Upon arrival to room, pt asleep in bed however easily awoken and agreeable to OT tx. Pt with improved alertness this date, with eyes open throughout session and able to participate with less verbal cues. At beginning of session, pt was noted to have BM in bed. Pt performed bed-level toileting MOD A and UB dressing MIN A. Following bed-level toileting, pt agreeable to transfer to chair for bed linen change. Pt required MOD A for supine>sit transfer and MOD A for stand pivot transfer bed>recliner. Pt provided with HEP consisting of chair push ups and ankle pumps, with pt demonstrating good understanding. At end of session, pt left in recliner, with all needs within reach and pt in no acute distress.   Vitals: - Start of session (in high fowler's postioning in bed): 127/55 - End of session (in recliner with feet elevated): 137/57  Pt continues to benefit from skilled OT services to maximize return to PLOF and minimize risk of future falls, injury, caregiver burden, and readmission. Will continue to follow POC. Discharge recommendation remains appropriate.     Follow Up Recommendations  SNF;Supervision - Intermittent    Equipment Recommendations  Other (comment) (defer to next venue of care)       Precautions / Restrictions Precautions Precautions: Fall Restrictions Weight Bearing Restrictions: No       Mobility Bed Mobility Overal bed mobility: Needs Assistance Bed Mobility: Rolling;Supine to Sit Rolling: Min guard   Supine to sit: Min assist     General bed mobility comments: MIN A for bringing LLE toward EOB    Transfers Overall transfer  level: Needs assistance Equipment used: Rolling walker (2 wheeled) Transfers: Sit to/from [facility] Sit to Stand: Mod assist Stand pivot transfers: Mod assist       General transfer comment: L knee buckling during bed > recliner transfer    Balance Overall balance assessment: Needs assistance;History of Falls Sitting-balance support: Feet supported;Single extremity supported Sitting balance-Leahy Scale: Fair Sitting balance - Comments: MIN GUARD for static sitting balance at EOB   Standing balance support: Bilateral upper extremity supported;During functional activity Standing balance-Leahy Scale: Poor Standing balance comment: requires MOD A during stand pivot transfer d/t L knee buckle                           ADL either performed or assessed with clinical judgement   ADL Overall ADL's : Needs assistance/impaired     Grooming: Wash/dry hands;Set up;Bed level           Upper Body Dressing : Minimal assistance;Bed level Upper Body Dressing Details (indicate cue type and reason): to don/doff hospital gown         Toileting- Clothing Manipulation and Hygiene: Moderate assistance;Bed level Toileting - Clothing Manipulation Details (indicate cue type and reason): Pt able to perform frontal peri-care with SET-UP assist. Requires MAX A for posterior peri-care     Functional mobility during ADLs: Moderate assistance (stand pivot tranfer bed>chair)        Cognition Arousal/Alertness: Awake/alert Behavior During Therapy: WFL for tasks assessed/performed Overall Cognitive Status: History of cognitive impairments - at baseline  General Comments: Pt with improved alertness this date, eyes open throughout session and able to participate with less verbal cues        Exercises General Exercises - Upper Extremity Chair Push Up: Strengthening;Both;10 reps;Seated General Exercises - Lower  Extremity Ankle Circles/Pumps: AROM;Both;10 reps;Seated Long Arc Quad: AROM;Both;10 reps;Seated           Pertinent Vitals/ Pain       Pain Assessment: No/denies pain         Frequency  Min 2X/week        Progress Toward Goals  OT Goals(current goals can now be found in the care plan section)     Acute Rehab OT Goals Patient Stated Goal: "Get better so I can go home." OT Goal Formulation: With patient Time For Goal Achievement: 09/27/20 Potential to Achieve Goals: Fair   AM-PAC OT "6 Clicks" Daily Activity     Outcome Measure   Help from another person eating meals?: None Help from another person taking care of personal grooming?: A Little Help from another person toileting, which includes using toliet, bedpan, or urinal?: A Lot Help from another person bathing (including washing, rinsing, drying)?: A Lot Help from another person to put on and taking off regular upper body clothing?: A Little Help from another person to put on and taking off regular lower body clothing?: Total 6 Click Score: 15    End of Session Equipment Utilized During Treatment: Gait belt  OT Visit Diagnosis: Unsteadiness on feet (R26.81);Muscle weakness (generalized) (M62.81)   Activity Tolerance Patient tolerated treatment well   Patient Left in chair;with call bell/phone within reach;with chair alarm set   Nurse Communication Mobility status        Time: 9292-4462 OT Time Calculation (min): 39 min  Charges: OT General Charges $OT Visit: 1 Visit OT Treatments $Self Care/Home Management : 38-52 mins  Fredirick Maudlin, OTR/L [place]

## 2020-09-20 NOTE — Plan of Care (Signed)
  Problem: Clinical Measurements: Goal: Ability to maintain clinical measurements within normal limits will improve Outcome: Progressing Goal: Will remain free from infection Outcome: Progressing Goal: Diagnostic test results will improve Outcome: Progressing Goal: Respiratory complications will improve Outcome: Progressing Goal: Cardiovascular complication will be avoided Outcome: Progressing   Problem: Pain Managment: Goal: General experience of comfort will improve Outcome: Progressing  V/S stable. Denies any pain or SOB at this time. Slept well all night.

## 2020-09-20 NOTE — Progress Notes (Signed)
[facility] PROGRESS NOTE    Ariana Lee  NUU:[id number] DOB: February 26, 1940 DOA: 09/12/2020 PCP: Pcp, No      Brief Narrative:  [name] is a 80 y.o. F with metastatic lung CA, HTN, CAD, and PVD who presented with several days of generalized weakness, poor appetite, falls.  In the ER found to be COVID-positive.  Also found to have widespread metastatic disease throughout the thoracic and lumbar spine with some mild impingement.  Neurosurgery was consulted recommended palliative radiation.        Assessment & Plan:  Asymptomatic COVID-19 Some mild anorexia asthenia on admission, this is now resolved.  Completed 5 days remdesivir. - Continue home Decadron 2 mg daily   AKI on CKD stage IIIa Resolved with fluids and lisinopril.  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   Difficult to place patient No       Level of care: Med-Surg       MDM: The below labs and imaging reports were reviewed and summarized above.  Medication management as above.    DVT prophylaxis: SCDs Start: 09/12/20 1940  Code Status: FULL Family Communication: called to both numbers listed in chart, no answer          Subjective: No sore throat, runny nose, cough, dyspnea.  No fever, confusion.  Objective: Vitals:   09/19/20 1553 09/19/20  2100 09/20/20 0400 09/20/20 0924  BP: (!) 118/54 120/62 125/68 104/64  Pulse: 93 90 82 72  Resp: 18 18 18 15   Temp: 99.1 F (37.3 C) 98.9 F (37.2 C) 98.7 F (37.1 C) 98.1 F (36.7 C)  TempSrc: Oral Oral Oral Oral  SpO2: 99% 98% 100% 100%  Weight:      Height:       No intake or output data in the 24 hours ending 09/20/20 1626 Filed Weights   09/12/20 1055  Weight: 90.7 kg    Examination: General appearance:  adult female, alert and in no distress.   HEENT: Anicteric, conjunctiva pink, lids and lashes normal. No nasal deformity, discharge, epistaxis.  Lips moist.   Skin: Warm and dry.  no jaundice.  No suspicious rashes or lesions. Cardiac: RRR, nl S1-S2, no murmurs appreciated.  Capillary refill is brisk.  No lower extremity edema Respiratory: Normal respiratory rate and rhythm.  CTAB without rales or wheezes. Abdomen: Abdomen soft.  no TTP. No ascites, distension, hepatosplenomegaly.   MSK: No deformities or effusions. Neuro: Awake and alert.  EOMI, moves all extremities with generalized weakness. Speech fluent.    Psych: Sensorium intact and responding to questions, attention normal. Affect normal.  Judgment and insight appear normal.    Data Reviewed: I have personally reviewed following labs and imaging studies:  CBC: Recent Labs  Lab 09/14/20 0752 09/15/20 0610 09/16/20  9242 09/17/20 0538 09/20/20 0544  WBC 13.2* 10.7* 11.0* 11.9* 12.7*  HGB 11.4* 11.1* 11.4* 11.4* 10.8*  HCT 34.5* 32.8* 33.8* 34.7* 32.3*  MCV 83.3 82.6 84.7 83.8 82.2  PLT 144* 134* 147* 148* 683   Basic Metabolic Panel: Recent Labs  Lab 09/14/20 0752 09/15/20 0610 09/16/20 0445 09/17/20 0538 09/19/20 0634  NA 137 138 139 138 136  K 3.5 3.3* 3.9 4.2 4.2  CL 106 107 109 107 106  CO2 24 25 24 24 22   GLUCOSE 103* 101* 106* 105* 87  BUN 27* 24* 28* 28* 23  CREATININE 0.87 0.94 0.98 0.96 0.84  CALCIUM 8.6* 8.1* 8.3* 8.5* 8.2*  MG 1.6* 1.9 2.0  --  1.9   GFR: Estimated Creatinine  Clearance: 58.5 mL/min (by C-G formula based on SCr of 0.84 mg/dL). Liver Function Tests: Recent Labs  Lab 09/14/20 0752 09/15/20 0610  AST 18 16  ALT 18 17  ALKPHOS 268* 255*  BILITOT 0.8 0.7  PROT 5.2* 4.8*  ALBUMIN 2.6* 2.2*   No results for input(s): LIPASE, AMYLASE in the last 168 hours. No results for input(s): AMMONIA in the last 168 hours. Coagulation Profile: No results for input(s): INR, PROTIME in the last 168 hours. Cardiac Enzymes: No results for input(s): CKTOTAL, CKMB, CKMBINDEX, TROPONINI in the last 168 hours. BNP (last 3 results) No results for input(s): PROBNP in the last 8760 hours. HbA1C: No results for input(s): HGBA1C in the last 72 hours. CBG: Recent Labs  Lab 09/18/20 1300 09/18/20 1735  GLUCAP 218* 136*   Lipid Profile: No results for input(s): CHOL, HDL, LDLCALC, TRIG, CHOLHDL, LDLDIRECT in the last 72 hours. Thyroid Function Tests: No results for input(s): TSH, T4TOTAL, FREET4, T3FREE, THYROIDAB in the last 72 hours. Anemia Panel: No results for input(s): VITAMINB12, FOLATE, FERRITIN, TIBC, IRON, RETICCTPCT in the last 72 hours. Urine analysis:    Component Value Date/Time   COLORURINE YELLOW (A) 05/20/2017 0923   APPEARANCEUR HAZY (A) 05/20/2017 0923   LABSPEC 1.014 05/20/2017 0923   PHURINE 7.0 05/20/2017 0923   GLUCOSEU NEGATIVE 05/20/2017 0923   HGBUR NEGATIVE 05/20/2017 0923   BILIRUBINUR NEGATIVE 05/20/2017 0923   KETONESUR NEGATIVE 05/20/2017 0923   PROTEINUR NEGATIVE 05/20/2017 0923   NITRITE NEGATIVE 05/20/2017 0923   LEUKOCYTESUR SMALL (A) 05/20/2017 0923   Sepsis Labs: @LABRCNTIP (procalcitonin:4,lacticacidven:4)  ) Recent Results (from the past 240 hour(s))  Resp Panel by RT-PCR (Flu A&B, Covid) Nasopharyngeal Swab     Status: Abnormal   Collection Time: 09/12/20  3:12 PM   Specimen: Nasopharyngeal Swab; Nasopharyngeal(NP) swabs in vial transport medium  Result Value Ref Range Status   SARS Coronavirus 2 by RT PCR  POSITIVE (A) NEGATIVE Final    Comment: RESULT CALLED TO, READ BACK BY AND VERIFIED WITH: KATIE RAND AT 4196 ON 09/12/20 BY SS (NOTE) SARS-CoV-2 target nucleic acids are DETECTED.  The SARS-CoV-2 RNA is generally detectable in upper respiratory specimens during the acute phase of infection. Positive results are indicative of the presence of the identified virus, but do not rule out bacterial infection or co-infection with other pathogens not detected by the test. Clinical correlation with patient history and other diagnostic information is necessary to determine patient infection status. The expected result is Negative.  Fact Sheet for Patients: EntrepreneurPulse.com.au  Fact Sheet for Healthcare Providers: IncredibleEmployment.be  This test is not yet approved or cleared by the [place] FDA and  has been authorized for detection and/or diagnosis of SARS-CoV-2 by FDA under an  Emergency Use Authorization (EUA).  This EUA will remain in effect (meaning this test can  be used) for the duration of  the COVID-19 declaration under Section 564(b)(1) of the Act, 21 U.S.C. section 360bbb-3(b)(1), unless the authorization is terminated or revoked sooner.     Influenza A by PCR NEGATIVE NEGATIVE Final   Influenza B by PCR NEGATIVE NEGATIVE Final    Comment: (NOTE) The Xpert Xpress SARS-CoV-2/FLU/RSV plus assay is intended as an aid in the diagnosis of influenza from Nasopharyngeal swab specimens and should not be used as a sole basis for treatment. Nasal washings and aspirates are unacceptable for Xpert Xpress SARS-CoV-2/FLU/RSV testing.  Fact Sheet for Patients: EntrepreneurPulse.com.au  Fact Sheet for Healthcare Providers: IncredibleEmployment.be  This test is not yet approved or cleared by the [place] FDA and has been authorized for detection and/or diagnosis of SARS-CoV-2 by FDA under an Emergency  Use Authorization (EUA). This EUA will remain in effect (meaning this test can be used) for the duration of the COVID-19 declaration under Section 564(b)(1) of the Act, 21 U.S.C. section 360bbb-3(b)(1), unless the authorization is terminated or revoked.  Performed at [facility], [address] [place], [place] [address]          Radiology Studies: No results found.      Scheduled Meds:  atorvastatin  80 mg Oral QHS   carvedilol  12.5 mg Oral BID WC   dexamethasone  2 mg Oral Daily   felodipine  10 mg Oral Daily   lidocaine  1 patch Transdermal Q24H   lisinopril  20 mg Oral Daily   pantoprazole  40 mg Oral BID   polyethylene glycol  17 g Oral Daily   senna-docusate  1 tablet Oral BID   sodium chloride flush  3 mL Intravenous Q12H   Continuous Infusions:   LOS: 7 days    Time spent: 25 minutes    Edwin Dada, MD [facility] 09/20/2020, 4:26 PM     Please page though [employer] or Epic secure chat:  For [employer], Adult nurse

## 2020-09-21 DIAGNOSIS — R531 Weakness: Secondary | ICD-10-CM | POA: Diagnosis not present

## 2020-09-21 NOTE — TOC Progression Note (Addendum)
Transition of Care [facility]) - Progression Note    Patient Details  Name: Ariana Lee MRN: [id number] Date of Birth: 06/25/1940  Transition of Care [facility]) CM/SW Contact  Beverly Sessions, RN Phone Number: 09/21/2020, 10:50 AM  Clinical Narrative:      Plan remains for patient to discharge to [facility] on 9/3 MD to complete DC summary tomorrow Son updated Insurance auth started in [place] portal    1236pm Auth obtained. [id number] valid 9/3-9/6.  Hilda Blades at [facility] notified   Expected Discharge Plan: [place] Barriers to Discharge: Continued Medical Work up  Expected Discharge Plan and Services Expected Discharge Plan: [place]   Discharge Planning Services: CM Consult   Living arrangements for the past 2 months: Single Family Home                                       Social Determinants of Health (SDOH) Interventions    Readmission Risk Interventions No flowsheet data found.

## 2020-09-21 NOTE — Progress Notes (Addendum)
Occupational Therapy Treatment Patient Details Name: JAHNA LIEBERT MRN: [id number] DOB: 1941-01-21 Today's Date: 09/21/2020    History of present illness Pt admitted for weakness with repeated falls. 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   OT comments  Pt seen for OT treatment on this date. Upon arrival to room, pt asleep however easily awoken and agreeable to OT tx. Pt alert and oriented to self, place, year (stated month was Jan), and parts of situation. With MIN A and HOB elevated, pt able to perform bed mobility. Following supine>sit transfer, pt c/o of dizziness however dizziness resolved following seated UE/LE exercises at EOB.   Vitals obtained with progressive mobility: sitting EOB BP 102/54, HR 88, SpO2 98%; standing 116/65; seated in recliner following rest 95/56, HR 87, SpO2 100%.  Pt continues to present with decreased strength, balance, and activity tolerance. Due to these functional impairments, pt requires MAX A for LB dressing, MAX A+2 for sit>stand peri-care, and MOD A+2 for stand pivot transfer with bilateral underarm support (d/t fatigue following x3 sit<>stand transfers). Pt is making good progress toward goals and continues to benefit from skilled OT services to maximize return to PLOF and minimize risk of future falls, injury, caregiver burden, and readmission. Will continue to follow POC. Discharge recommendation remains appropriate.     Follow Up Recommendations  SNF;Supervision - Intermittent    Equipment Recommendations  Other (comment) (defer to next venue of care)       Precautions / Restrictions Precautions Precautions: Fall Restrictions Weight Bearing Restrictions: No       Mobility Bed Mobility Overal bed mobility: Needs Assistance Bed Mobility: Supine to Sit     Supine to sit: Min assist     General bed mobility comments: MIN A for bringing LLE toward EOB    Transfers Overall transfer level:  Needs assistance Equipment used: Rolling walker (2 wheeled) Transfers: Sit to/from [facility] Sit to Stand: Mod assist Stand pivot transfers: Mod assist;+2 physical assistance       General transfer comment: +2 assist required for stand pivot transfer to chair d/t fatigue following x3 sit<>stand transfers for posterior peri-care    Balance Overall balance assessment: Needs assistance;History of Falls Sitting-balance support: Feet supported;Single extremity supported Sitting balance-Leahy Scale: Fair Sitting balance - Comments: MIN GUARD for static sitting balance at EOB. Intermittent R lateral lean d/t fatigue however able to self correct with verbal/tactile cues Postural control: Right lateral lean Standing balance support: Bilateral upper extremity supported;During functional activity Standing balance-Leahy Scale: Poor Standing balance comment: MOD A for static standing balance with b/l UE support, however required MOD A+2 for dynamic standing balance                           ADL either performed or assessed with clinical judgement   ADL Overall ADL's : Needs assistance/impaired                     Lower Body Dressing: Maximal assistance;Sitting/lateral leans Lower Body Dressing Details (indicate cue type and reason): to don/doff socks     Toileting- Clothing Manipulation and Hygiene: Maximal assistance;Sit to/from stand;+2 for physical assistance Toileting - Clothing Manipulation Details (indicate cue type and reason): MAX A to maintain standing balance and +2 for MAX A with peri-care     Functional mobility during ADLs: Moderate assistance;+2 for physical assistance (stand pivot transfer bed>chair)  Cognition Arousal/Alertness: Awake/alert Behavior During Therapy: WFL for tasks assessed/performed Overall Cognitive Status: History of cognitive impairments - at baseline                                 General  Comments: Pt alert and oriented to self, place, year (stated month Jan), and parts of situation. Pleasant and agreeable throughout however fatigues quickly        Exercises General Exercises - Upper Extremity Shoulder Flexion: AROM;Both;10 reps;Seated Elbow Flexion: AROM;Both;10 reps;Seated Elbow Extension: AROM;Both;10 reps;Seated General Exercises - Lower Extremity Ankle Circles/Pumps: AROM;Both;10 reps;Seated Long Arc Quad: AROM;Both;10 reps;Seated Hip Flexion/Marching: AROM;Both;5 reps      General Comments Pt c/o of dizziness following supine>sit transfer, however dizziness resolved following seated UE/LE exercises at EOB. Vitals obtained with progressive mobility: sitting EOB BP 102/54, HR 88, SpO2 98%; standing 116/65; seated in recliner following rest 95/56, HR 87, SpO2 100%    Pertinent Vitals/ Pain       Pain Assessment: No/denies pain         Frequency  Min 2X/week        Progress Toward Goals  OT Goals(current goals can now be found in the care plan section)  Progress towards OT goals: Progressing toward goals  Acute Rehab OT Goals Patient Stated Goal: "Get better so I can go home." OT Goal Formulation: With patient Time For Goal Achievement: 09/27/20 Potential to Achieve Goals: [place] Discharge plan remains appropriate;Frequency remains appropriate    Co-evaluation    PT/OT/SLP Co-Evaluation/Treatment: Yes Reason for Co-Treatment: For patient/therapist safety;To address functional/ADL transfers PT goals addressed during session: Mobility/safety with mobility OT goals addressed during session: ADL's and self-care      AM-PAC OT "6 Clicks" Daily Activity     Outcome Measure   Help from another person eating meals?: None Help from another person taking care of personal grooming?: A Little Help from another person toileting, which includes using toliet, bedpan, or urinal?: A Lot Help from another person bathing (including washing, rinsing,  drying)?: A Lot Help from another person to put on and taking off regular upper body clothing?: A Little Help from another person to put on and taking off regular lower body clothing?: A Lot 6 Click Score: 16    End of Session Equipment Utilized During Treatment: Gait belt;Rolling walker  OT Visit Diagnosis: Unsteadiness on feet (R26.81);Muscle weakness (generalized) (M62.81)   Activity Tolerance Patient tolerated treatment well   Patient Left in chair;with call bell/phone within reach;with chair alarm set   Nurse Communication Mobility status        Time: 6004-5997 OT Time Calculation (min): 35 min  Charges: OT General Charges $OT Visit: 1 Visit OT Treatments $Self Care/Home Management : 8-22 mins $Therapeutic Activity: 8-22 mins  Fredirick Maudlin, OTR/L [place]

## 2020-09-21 NOTE — Progress Notes (Signed)
Physical Therapy Treatment Patient Details Name: Ariana Lee MRN: [id number] DOB: 1940-05-22 Today's Date: 09/21/2020    History of Present Illness Pt admitted for weakness with repeated falls. 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    PT Comments    Pt fatigued, but pleasant and willing to work w/ therapy, seated EOB working with OT. Pt noted dizziness upon sitting, dissipated with continued seated positioning, there ex w/ OT. Primary limitations are weakness, decreased endurance and balance. Pt requires Max-A for sit <> stand, +2 physical assist w/ HHA, stand-pivot to recliner, Mod-A + 2 physical assist w/ HHA. Increased assistance sit <> stand this session following there ex w/ OT, but pt able to perform STS x 3 for pericare. Pt is able to maintain hip and trunk extension intermittently with tactile cues. Skilled PT intervention is indicated to address deficits in function, mobility, and to return to PLOF as able.  Discharge recommendations are SNF.  Follow Up Recommendations  SNF     Equipment Recommendations  Other (comment) (TBD next venue of care)    Recommendations for Other Services       Precautions / Restrictions Precautions Precautions: Fall Restrictions Weight Bearing Restrictions: No    Mobility  Bed Mobility Overal bed mobility: Needs Assistance Bed Mobility: Supine to Sit     Supine to sit: Min assist     General bed mobility comments: Pt sitting EOB w/ OT beginning of session    Transfers Overall transfer level: Needs assistance Equipment used: 2 person hand held assist Transfers: Sit to/from [facility] Sit to Stand: Max assist Stand pivot transfers: Mod assist;+2 physical assistance       General transfer comment: Pt able to perform STS x 3 w/ bouts of 45 s for pericare, increased fatigue, weakness with static stance  Ambulation/Gait                 Stairs              Wheelchair Mobility    Modified Rankin (Stroke Patients Only)       Balance Overall balance assessment: Needs assistance;History of Falls Sitting-balance support: Feet supported;Single extremity supported Sitting balance-Leahy Scale: Fair Sitting balance - Comments: MIN GUARD for static sitting balance at EOB. Intermittent R lateral lean d/t fatigue however able to self correct with verbal/tactile cues Postural control: Right lateral lean Standing balance support: Bilateral upper extremity supported;During functional activity Standing balance-Leahy Scale: Poor Standing balance comment: requires MOD A for static stance, unable to achieve full hip, trunk ext consistently                            Cognition Arousal/Alertness: Awake/alert Behavior During Therapy: WFL for tasks assessed/performed Overall Cognitive Status: History of cognitive impairments - at baseline                                 General Comments: Pt fatigues easily but is cooperative and pleasant      Exercises General Exercises - Upper Extremity Shoulder Flexion: AROM;Both;10 reps;Seated Elbow Flexion: AROM;Both;10 reps;Seated Elbow Extension: AROM;Both;10 reps;Seated General Exercises - Lower Extremity Ankle Circles/Pumps: AROM;Both;10 reps;Seated Long Arc Quad: AROM;Both;10 reps;Seated Hip Flexion/Marching: AROM;Both;5 reps Other Exercises Other Exercises: Performed pericare, linen change w/ OT due to BM x 1 w/ Mod-A static stance, sit <> stand  General Comments General comments (skin integrity, edema, etc.): Pt c/o of dizziness following supine>sit transfer, however dizziness resolved following seated UE/LE exercises at EOB. Vitals obtained with progressive mobility: sitting EOB BP 102/54, HR 88, SpO2 98%; standing 116/65; seated in recliner following rest 95/56, HR 87, SpO2 100%      Pertinent Vitals/Pain Pain Assessment: No/denies pain    Home Living                       Prior Function            PT Goals (current goals can now be found in the care plan section) Acute Rehab PT Goals Patient Stated Goal: "Get better so I can go home." Progress towards PT goals: Progressing toward goals    Frequency    Min 2X/week      PT Plan Current plan remains appropriate    Co-evaluation PT/OT/SLP Co-Evaluation/Treatment: Yes Reason for Co-Treatment: For patient/therapist safety;To address functional/ADL transfers PT goals addressed during session: Mobility/safety with mobility OT goals addressed during session: ADL's and self-care      AM-PAC PT "6 Clicks" Mobility   Outcome Measure  Help needed turning from your back to your side while in a flat bed without using bedrails?: A Little Help needed moving from lying on your back to sitting on the side of a flat bed without using bedrails?: A Little Help needed moving to and from a bed to a chair (including a wheelchair)?: A Lot Help needed standing up from a chair using your arms (e.g., wheelchair or bedside chair)?: A Lot Help needed to walk in hospital room?: A Lot Help needed climbing 3-5 steps with a railing? : Total 6 Click Score: 13    End of Session Equipment Utilized During Treatment: Gait belt Activity Tolerance: Patient tolerated treatment well Patient left: in chair;with call bell/phone within reach;with chair alarm set;with SCD's reapplied Nurse Communication: Mobility status PT Visit Diagnosis: Unsteadiness on feet (R26.81);Repeated falls (R29.6);Muscle weakness (generalized) (M62.81);History of falling (Z91.81);Difficulty in walking, not elsewhere classified (R26.2)     Time: 1005-1040 PT Time Calculation (min) (ACUTE ONLY): 35 min  Charges:                        [employer], SPT

## 2020-09-21 NOTE — Progress Notes (Signed)
[facility] PROGRESS NOTE    Ariana Lee  ERD:[id number] DOB: 09-11-1940 DOA: 09/12/2020 PCP: Pcp, No      Brief Narrative:  [name] is a 80 y.o. F with metastatic lung CA, HTN, CAD, and PVD who presented with several days of generalized weakness, poor appetite, falls.  In the ER found to be COVID-positive.  Also found to have widespread metastatic disease throughout the thoracic and lumbar spine with some mild impingement.  Neurosurgery was consulted recommended palliative radiation.        Assessment & Plan:  Asymptomatic COVID-19 Some mild anorexia asthenia on admission, this is now resolved.  Completed 5 days remdesivir. - Continue home Decadron 2 mg daily   AKI on CKD stage IIIa Resolved with fluids and lisinopril.  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   Difficult to place patient No       Level of care: Med-Surg       MDM: The below labs and imaging reports were reviewed and summarized above.  Medication management as above.    DVT prophylaxis: SCDs Start: 09/12/20 1940  Code Status: FULL Family Communication: called to both numbers listed in chart, no answer          Subjective: No new headache, chest pain, dyspnea.  No abdominal pain, fever, confusion.  Objective: Vitals:   09/20/20 0400  09/20/20 0924 09/20/20 2116 09/21/20 0904  BP: 125/68 104/64 (!) 119/50 (!) 135/59  Pulse: 82 72 63 67  Resp: 18 15 20 15   Temp: 98.7 F (37.1 C) 98.1 F (36.7 C) 98.5 F (36.9 C) 98 F (36.7 C)  TempSrc: Oral Oral Oral   SpO2: 100% 100% 100% 100%  Weight:      Height:        Intake/Output Summary (Last 24 hours) at 09/21/2020 1727 Last data filed at 09/20/2020 2302 Gross per 24 hour  Intake 5 ml  Output --  Net 5 ml   Filed Weights   09/12/20 1055  Weight: 90.7 kg    Examination: General appearance: Elderly female, lying in bed, interactive, no acute distress     HEENT:    Skin:  Cardiac: RRR, no murmurs, no lower extremity edema Respiratory: Normal respiratory rate and rhythm, still without rales or wheezes Abdomen:   MSK:  Neuro:    Psych: Attention normal, affect appropriate, judgment and insight appear normal     Data Reviewed: I have personally reviewed following labs and imaging studies:  CBC: Recent Labs  Lab 09/15/20 0610 09/16/20 0445 09/17/20 0538 09/20/20 0544  WBC 10.7* 11.0* 11.9* 12.7*  HGB 11.1* 11.4* 11.4* 10.8*  HCT 32.8* 33.8* 34.7* 32.3*  MCV 82.6 84.7 83.8 82.2  PLT 134* 147* 148* 563   Basic Metabolic Panel: Recent Labs  Lab  09/15/20 0610 09/16/20 0445 09/17/20 0538 09/19/20 0634  NA 138 139 138 136  K 3.3* 3.9 4.2 4.2  CL 107 109 107 106  CO2 25 24 24 22   GLUCOSE 101* 106* 105* 87  BUN 24* 28* 28* 23  CREATININE 0.94 0.98 0.96 0.84  CALCIUM 8.1* 8.3* 8.5* 8.2*  MG 1.9 2.0  --  1.9   GFR: Estimated Creatinine Clearance: 58.5 mL/min (by C-G formula based on SCr of 0.84 mg/dL). Liver Function Tests: Recent Labs  Lab 09/15/20 0610  AST 16  ALT 17  ALKPHOS 255*  BILITOT 0.7  PROT 4.8*  ALBUMIN 2.2*   No results for input(s): LIPASE, AMYLASE in the last 168 hours. No results for input(s): AMMONIA in the last 168 hours. Coagulation Profile: No results for input(s): INR, PROTIME in the last 168 hours. Cardiac  Enzymes: No results for input(s): CKTOTAL, CKMB, CKMBINDEX, TROPONINI in the last 168 hours. BNP (last 3 results) No results for input(s): PROBNP in the last 8760 hours. HbA1C: No results for input(s): HGBA1C in the last 72 hours. CBG: Recent Labs  Lab 09/18/20 1300 09/18/20 1735  GLUCAP 218* 136*   Lipid Profile: No results for input(s): CHOL, HDL, LDLCALC, TRIG, CHOLHDL, LDLDIRECT in the last 72 hours. Thyroid Function Tests: No results for input(s): TSH, T4TOTAL, FREET4, T3FREE, THYROIDAB in the last 72 hours. Anemia Panel: No results for input(s): VITAMINB12, FOLATE, FERRITIN, TIBC, IRON, RETICCTPCT in the last 72 hours. Urine analysis:    Component Value Date/Time   COLORURINE YELLOW (A) 05/20/2017 0923   APPEARANCEUR HAZY (A) 05/20/2017 0923   LABSPEC 1.014 05/20/2017 0923   PHURINE 7.0 05/20/2017 0923   GLUCOSEU NEGATIVE 05/20/2017 0923   HGBUR NEGATIVE 05/20/2017 0923   BILIRUBINUR NEGATIVE 05/20/2017 0923   KETONESUR NEGATIVE 05/20/2017 0923   PROTEINUR NEGATIVE 05/20/2017 0923   NITRITE NEGATIVE 05/20/2017 0923   LEUKOCYTESUR SMALL (A) 05/20/2017 0923   Sepsis Labs: @LABRCNTIP (procalcitonin:4,lacticacidven:4)  ) Recent Results (from the past 240 hour(s))  Resp Panel by RT-PCR (Flu A&B, Covid) Nasopharyngeal Swab     Status: Abnormal   Collection Time: 09/12/20  3:12 PM   Specimen: Nasopharyngeal Swab; Nasopharyngeal(NP) swabs in vial transport medium  Result Value Ref Range Status   SARS Coronavirus 2 by RT PCR POSITIVE (A) NEGATIVE Final    Comment: RESULT CALLED TO, READ BACK BY AND VERIFIED WITH: KATIE RAND AT 3614 ON 09/12/20 BY SS (NOTE) SARS-CoV-2 target nucleic acids are DETECTED.  The SARS-CoV-2 RNA is generally detectable in upper respiratory specimens during the acute phase of infection. Positive results are indicative of the presence of the identified virus, but do not rule out bacterial infection or co-infection with other pathogens  not detected by the test. Clinical correlation with patient history and other diagnostic information is necessary to determine patient infection status. The expected result is Negative.  Fact Sheet for Patients: EntrepreneurPulse.com.au  Fact Sheet for Healthcare Providers: IncredibleEmployment.be  This test is not yet approved or cleared by the [place] FDA and  has been authorized for detection and/or diagnosis of SARS-CoV-2 by FDA under an Emergency Use Authorization (EUA).  This EUA will remain in effect (meaning this test can  be used) for the duration of  the COVID-19 declaration under Section 564(b)(1) of the Act, 21 U.S.C. section 360bbb-3(b)(1), unless the authorization is terminated or revoked sooner.     Influenza A by PCR NEGATIVE NEGATIVE Final   Influenza B by PCR NEGATIVE NEGATIVE Final    Comment: (  NOTE) The Xpert Xpress SARS-CoV-2/FLU/RSV plus assay is intended as an aid in the diagnosis of influenza from Nasopharyngeal swab specimens and should not be used as a sole basis for treatment. Nasal washings and aspirates are unacceptable for Xpert Xpress SARS-CoV-2/FLU/RSV testing.  Fact Sheet for Patients: EntrepreneurPulse.com.au  Fact Sheet for Healthcare Providers: IncredibleEmployment.be  This test is not yet approved or cleared by the [place] FDA and has been authorized for detection and/or diagnosis of SARS-CoV-2 by FDA under an Emergency Use Authorization (EUA). This EUA will remain in effect (meaning this test can be used) for the duration of the COVID-19 declaration under Section 564(b)(1) of the Act, 21 U.S.C. section 360bbb-3(b)(1), unless the authorization is terminated or revoked.  Performed at [facility], [address] [place], [place] [address]          Radiology Studies: No results found.      Scheduled Meds:  atorvastatin  80 mg Oral  QHS   carvedilol  12.5 mg Oral BID WC   dexamethasone  2 mg Oral Daily   felodipine  10 mg Oral Daily   lidocaine  1 patch Transdermal Q24H   lisinopril  20 mg Oral Daily   pantoprazole  40 mg Oral BID   polyethylene glycol  17 g Oral Daily   senna-docusate  1 tablet Oral BID   sodium chloride flush  3 mL Intravenous Q12H   Continuous Infusions:   LOS: 8 days    Time spent: 25 minutes    Edwin Dada, MD [facility] 09/21/2020, 5:27 PM     Please page though [place] or Epic secure chat:  For [employer], Adult nurse

## 2020-09-22 DIAGNOSIS — R531 Weakness: Secondary | ICD-10-CM | POA: Diagnosis not present

## 2020-09-22 MED ORDER — BISACODYL 5 MG PO TBEC: 5.0000 mg | DELAYED_RELEASE_TABLET | Freq: Every day | ORAL | 0 refills | Status: AC | PRN

## 2020-09-22 MED ORDER — ACETAMINOPHEN 500 MG PO TABS: 1000.0000 mg | ORAL_TABLET | Freq: Four times a day (QID) | ORAL | 0 refills | Status: AC | PRN

## 2020-09-22 NOTE — Discharge Summary (Addendum)
Physician Discharge Summary  Ariana Lee VCB:[id number] DOB: 1940/04/30 DOA: 09/12/2020  PCP: Pcp, No  Admit date: 09/12/2020 Discharge date: 09/23/2020  Admitted From: Home  Disposition:  SNF   Recommendations for Outpatient Follow-up:  Follow up with [facility] Oncologist in 1 week for consideration of palliative radiation to spine Check creatinine in 1 week      Home Health: N/A  Equipment/Devices: TBD at SNF  Discharge Condition: Fair  CODE STATUS: FULL Diet recommendation: Regular  Brief/Interim Summary: Ariana Lee is a 80 y.o. F with metastatic lung CA, HTN, CAD, and PVD who presented with several days of generalized weakness, poor appetite, falls.   In the ER found to be COVID-positive.  Also found to have widespread metastatic disease throughout the thoracic and lumbar spine with some mild impingement.  Neurosurgery was consulted recommended palliative radiation.       PRINCIPAL HOSPITAL DIAGNOSIS: Acute kidney injury due to COVID-19    Discharge Diagnoses:     AKI Cr 1.3 on admission, up from baseline 0.8.  Resolved with fluids and holding lisinopril.  Lisinopril restarted and Cr stable.  CKD ruled out.    COVID-19 without pneumonia Presented with anorexia asthenia on admission.  Completed 5 days remdesivir.  Symptoms resolved.  Completed 10 days isolation.     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  Had previously been on Keytruda but was on drug holiday due to worsening performance status.  On admission, CT imaging here showed widespread vertebral metastases with associated spinal canal and foraminal stenosis with some resulting lower extremity weakness.  She was continued on Decadron, neurosurgery consulted who recommended no surgery, palliative radiation.  Oncology were consulted who recommended outpatient follow-up after resolution of COVID.    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  MRI of the brain was obtained that showed no acute findings, no intracranial metastatic disease, and small bilateral subdural hygromas with mild mass-effect and no midline shift.  Hypokalemia Hypomagnesemia Resolved  COPD No active disease  Hypertension Coronary artery disease Blood pressure controlled  Renal cell carcinoma Remote  Obesity BMI 33          Discharge Instructions   Allergies as of 09/22/2020       Reactions   Ivp Dye [iodinated Diagnostic Agents] Hives   Percocet [oxycodone-acetaminophen] Hives        Medication List     TAKE these medications    acetaminophen 500 MG tablet Commonly known as: TYLENOL Take 2 tablets (1,000 mg total) by mouth every 6 (six) hours as needed for mild pain, headache, moderate pain or fever (fever >/= 101).   Advil PM 200-25 MG Caps Generic drug: Ibuprofen-diphenhydrAMINE HCl Take 1-2 tablets by mouth at bedtime as needed (pain or sleep).   aspirin 81 MG EC tablet Take 81 mg by mouth daily.   atorvastatin 80 MG tablet Commonly known as: LIPITOR Take 80 mg by mouth at bedtime.   bisacodyl 5 MG EC tablet Commonly known as: DULCOLAX Take 1 tablet (5 mg total) by mouth daily as needed for moderate constipation.   carvedilol 12.5 MG tablet Commonly known as: COREG Take 12.5 mg by mouth 2 (two) times daily.   dexamethasone 2 MG tablet Commonly known as: DECADRON Take 2 mg by mouth daily.   felodipine 10  MG 24 hr tablet Commonly known as: PLENDIL Take 10 mg by mouth daily.   ibuprofen 200 MG tablet Commonly known as: ADVIL Take 400-600 mg by mouth every 6 (six) hours as needed for fever, mild pain or moderate pain.   lisinopril 20 MG tablet Commonly known as: ZESTRIL Take 20 mg by mouth daily.   methylphenidate 5 MG tablet Commonly known as: RITALIN Take 5 mg by mouth  daily.   omeprazole 20 MG capsule Commonly known as: PRILOSEC Take 20 mg by mouth 2 (two) times daily.   ondansetron 8 MG tablet Commonly known as: ZOFRAN Take 8 mg by mouth 3 (three) times daily as needed for nausea/vomiting.        Contact information for after-discharge care     Destination     [facility] [place] [facility] .   Service: Skilled Nursing Contact information: [address] [place] [phone]                    Allergies  Allergen Reactions   Ivp Dye [Iodinated Diagnostic Agents] Hives   Percocet [Oxycodone-Acetaminophen] Hives    Consultations: Oncology Neurosurgery    Procedures/Studies: DG Chest 2 View  Result Date: 09/12/2020 CLINICAL DATA:  Shortness of breath, history of lung cancer EXAM: CHEST - 2 VIEW COMPARISON:  2016 FINDINGS: Patchy left perihilar opacity. No pleural effusion or pneumothorax. Cardiomediastinal contours are within normal limits with normal heart size. Calcified plaque along the thoracic aorta. No acute osseous abnormality. IMPRESSION: Patchy left perihilar opacity. May reflect known lung cancer or pneumonia. Electronically Signed   By: Macy Mis M.D.   On: 09/12/2020 15:28   CT HEAD WO CONTRAST (5MM)  Result Date: 09/12/2020 CLINICAL DATA:  Head trauma, minor (Age >= 65y) EXAM: CT HEAD WITHOUT CONTRAST TECHNIQUE: Contiguous axial images were obtained from the base of the skull through the vertex without intravenous contrast. COMPARISON:  None. FINDINGS: Brain: Approximately 6 mm low-density right cerebral convexity subdural collection. Mild mass effect on the adjacent frontal lobe without significant midline shift. No definite hyperdense component. No evidence of acute large vascular territory infarct. Remote appearing small infarcts the right basal ganglia, extending overlying corona radiata. Additional patchy white matter hypoattenuation, likely the sequela of chronic  microvascular ischemic disease. Mild-to-moderate atrophy with ex vacuo ventricular dilation. No hydrocephalus. No evidence of a mass lesion. Partially empty and expanded sella. Vascular: No hyperdense vessel identified. Calcific intracranial atherosclerosis. Skull: No acute fracture. Sinuses/Orbits: Clear visualized sinuses no acute orbital findings. Other: No mastoid effusions. IMPRESSION: 1. 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 Mild mass effect on the adjacent frontal lobe without significant midline shift. 2. Small remote appearing infarct in the right basal ganglia, mild to moderate chronic microvascular ischemic disease and atrophy. 3. An MRI with contrast could provide more sensitive evaluation for metastatic disease if clinically indicated. Electronically Signed   By: Margaretha Sheffield M.D.   On: 09/12/2020 16:35   CT Cervical Spine Wo Contrast  Result Date: 09/12/2020 CLINICAL DATA:  Neck trauma (Age >= 65y) EXAM: CT CERVICAL SPINE WITHOUT CONTRAST TECHNIQUE: Multidetector CT imaging of the cervical spine was performed without intravenous contrast. Multiplanar CT image reconstructions were also generated. COMPARISON:  None. FINDINGS: Alignment: Straightening of the normal cervical lordosis. No substantial sagittal subluxation. Skull base and vertebrae: No convincing evidence of acute fracture cervical spine. Mild height loss of multiple lower cervical vertebral bodies,  favored to represent degenerative remodeling given no associated lucency, sclerosis or surrounding paraspinal edema. Small sclerotic lesions in the posterior elements at C7, suggestive metastatic given findings in the thoracic spine. Soft tissues and spinal canal: No prevertebral fluid or swelling. No visible canal hematoma. See CT of the thoracic spine for evaluation of canal findings at C2. Disc levels: Mild-to-moderate multilevel degenerative  disc disease and facet arthropathy. Upper chest: Evaluated on concurrent CT chest/abdomen/pelvis. Other: Calcific atherosclerosis of carotid arteries. IMPRESSION: 1. No convincing evidence of acute fracture or traumatic malalignment cervical spine. 2. Probable small sclerotic metastases in the posterior elements at C7. An MRI of the cervical spine with contrast provide more sensitive evaluation if clinically indicated. 3. Please see CT of thoracic spine for description of extensive metastatic disease in the thoracic spine, including epidural extension of tumor. Electronically Signed   By: Margaretha Sheffield M.D.   On: 09/12/2020 16:45   MR Brain W and Wo Contrast  Result Date: 09/13/2020 CLINICAL DATA:  Initial evaluation for brain mass or lesion. Previous studies concerning for pulmonary neoplasm with metastatic disease. EXAM: MRI HEAD WITHOUT AND WITH CONTRAST TECHNIQUE: Multiplanar, multiecho pulse sequences of the brain and surrounding structures were obtained without and with intravenous contrast. CONTRAST:  51mL GADAVIST GADOBUTROL 1 MMOL/ML IV SOLN COMPARISON:  Prior CT from earlier the same day. FINDINGS: Brain: Examination mildly degraded by motion artifact. Generalized age-related cerebral atrophy. Patchy T2/FLAIR hyperintensity involving the periventricular, deep, and subcortical white matter both cerebral hemispheres most consistent with chronic small vessel ischemic disease, moderate nature. Superimposed remote lacunar infarct at the right lentiform nucleus. No foci of diffusion abnormality to suggest acute or subacute ischemia. Gray-white matter differentiation maintained. No encephalomalacia to suggest chronic cortical infarction. No acute intracranial hemorrhage. Single subcentimeter focus of susceptibility artifact at the anterior right frontal corona radiata noted (series 15, image 31), likely a small chronic microhemorrhage. No acute blood products seen at this location on prior CT. No mass  lesion. No hydrocephalus. Small T2 hyperintense extra-axial collections seen overlying the bilateral cerebral convexities, consistent with hygromas. These measure up to 6 mm on the right and 2 mm on the left. Relatively mild mass effect on the subjacent right cerebral hemisphere without significant midline shift. Pituitary gland and suprasellar region normal. Midline structures intact. No abnormal enhancement. Vascular: Major intracranial vascular flow voids are grossly maintained at the skull base. Skull and upper cervical spine: Craniocervical junction within normal limits. Bone marrow signal intensity normal. No visible focal molar placing lesion. No scalp soft tissue abnormality. Sinuses/Orbits: Patient status post bilateral ocular lens replacement. Globes and orbital soft tissues demonstrate no acute finding. Paranasal sinuses are largely clear. No mastoid effusion. Inner ear structures grossly normal. Other: None. IMPRESSION: 1. No acute intracranial abnormality. No evidence for intracranial metastatic disease. 2. Small bilateral subdural hygromas measuring up to 6 mm on the right and 2 mm on the left. Relatively mild mass effect on the subjacent right cerebral hemisphere without significant midline shift. 3. Underlying age-related cerebral atrophy with moderate chronic microvascular ischemic disease. Small remote lacunar infarct at the right lentiform nucleus. Electronically Signed   By: Jeannine Boga M.D.   On: 09/13/2020 00:12   MR THORACIC SPINE W WO CONTRAST  Addendum Date: 09/13/2020   ADDENDUM REPORT: 09/13/2020 01:31 ADDENDUM: Results communicated by telephone on 09/13/2020 at 1:17 am to the hospitalist taking care of the patient, Dr Zada Finders. Electronically Signed   By: Jeannine Boga M.D.   On: 09/13/2020 01:31  Result Date: 09/13/2020 CLINICAL DATA:  Initial evaluation for metastatic lung carcinoma. EXAM: MRI THORACIC WITHOUT AND WITH CONTRAST TECHNIQUE: Multiplanar and  multiecho pulse sequences of the thoracic spine were obtained without and with intravenous contrast. CONTRAST:  5mL GADAVIST GADOBUTROL 1 MMOL/ML IV SOLN COMPARISON:  Prior CT from earlier the same day. FINDINGS: Alignment: Trace anterolisthesis of C7 on T1. Alignment otherwise normal throughout the thoracic spine with preservation of the normal thoracic kyphosis. Vertebrae: Multiple metastatic lesions seen throughout the thoracic spine, involving nearly all levels. Involvement is greatest at the level of T2 where the T2 vertebral body is essentially completely replaced by tumor. Associated height loss of up to 20%, consistent with associated pathologic fracture. Extensive extra osseous and epidural extension with tumor seen extending into the ventral and left greater than right epidural space (series 40, image 6). Secondary severe spinal stenosis with mass effect on the adjacent cord, which is flattened and slightly deviated posteriorly and to the right. Thecal sac measures approximately 5 mm in transverse diameter at its most narrow point. No visible cord signal changes. Tumor also extends into the left greater than right T1-2 and T2-3 neural foramina, with resultant severe stenosis on the left, and more moderate narrowing on the right. Otherwise, no other significant epidural or extraosseous tumor seen elsewhere within the thoracic spine. No other definite pathologic fracture at this time. Cord: Severe spinal stenosis with impingement of the thoracic spinal cord at the level of T2 as above. No definite cord signal changes. Elsewhere, signal intensity within the thoracic spinal cord is otherwise within normal limits. No other significant epidural or intracanalicular tumor. No other abnormal enhancement. Paraspinal and other soft tissues: Paraspinous soft tissues demonstrate no other acute finding. Multiple prominent T2 hyperintense cyst noted about the visualized kidneys. Disc levels: Mild lower cervical  spondylosis noted without high-grade stenosis. Normal for age multilevel disc desiccation seen throughout the thoracic spine. No other significant disc bulge or focal disc herniation. No other spinal stenosis. Posterior element hypertrophy at T11-12 with no more than mild foraminal narrowing. Foramina otherwise widely patent. IMPRESSION: 1. Widespread metastatic osseous disease throughout the thoracic spine, with greatest involvement at the level of T2 where there is associated pathologic fracture with up to 20% height loss. Prominent epidural extension at this level with resultant severe spinal stenosis and impingement of the thoracic spinal cord, but no visible cord signal changes. Associated extension into the left greater than right T1-2 and T2-3 neural foramina with severe stenosis on the left. Neuro surgical consultation recommended. 2. Additional scattered osseous metastatic disease elsewhere throughout the thoracic spine, with no other significant extra osseous or epidural tumor at this time. No other significant stenosis. Current attempt is being made to contact the ordering clinician regarding these findings. Resultant well be conveyed as soon as possible. Electronically Signed: By: Jeannine Boga M.D. On: 09/13/2020 01:15   MR Lumbar Spine W Wo Contrast  Result Date: 09/13/2020 CLINICAL DATA:  Initial evaluation for metastatic lung carcinoma, back pain, recent fall. EXAM: MRI LUMBAR SPINE WITHOUT AND WITH CONTRAST TECHNIQUE: Multiplanar and multiecho pulse sequences of the lumbar spine were obtained without and with intravenous contrast. CONTRAST:  66mL GADAVIST GADOBUTROL 1 MMOL/ML IV SOLN COMPARISON:  Prior CT from earlier the same day. FINDINGS: Segmentation: Standard. Lowest well-formed disc space labeled the L5-S1 level. Alignment: Trace anterolisthesis of L3 on L4, with 4 mm anterolisthesis of L4 on L5, and 3 mm anterolisthesis of L5 on S1. Findings chronic and facet mediated. Vertebrae:  Multiple scattered osseous metastases seen throughout the lumbar spine as well as the visualized sacrum and pelvis. Involvement of essentially all levels. For reference purposes, the largest lesion is positioned at L4 and measures approximately 2.8 cm. No significant extra osseous or epidural extension is seen. No visible associated pathologic fracture or other complication. Conus medullaris and cauda equina: Conus extends to the L1-2 level. Conus and cauda equina appear normal. Paraspinal and other soft tissues: Paraspinous soft tissues demonstrate no acute finding. Innumerable scattered cysts seen about the visualized kidneys, several of which are mildly complex in appearance. Additionally, there is a markedly heterogeneous enhancing solid-appearing mass arising from the interpolar right kidney measuring approximately 5.8 cm (series 55, image 11), concerning for a primary renal cell neoplasm. Few additional subcentimeter cystic lesions noted within the partially visualized liver, not well evaluated on this exam. Disc levels: L1-2: Negative interspace. Mild bilateral facet hypertrophy. No canal or foraminal stenosis. L2-3: Negative interspace. Mild to moderate facet hypertrophy. No canal or foraminal stenosis. L3-4: Trace anterolisthesis. Mild disc bulge. Moderate facet hypertrophy. No spinal stenosis. Mild bilateral L3 foraminal narrowing. L4-5: 4 mm anterolisthesis. Mild disc bulge with disc desiccation. Moderate facet hypertrophy. No spinal stenosis. Mild right greater than left L4 foraminal narrowing. L5-S1: Trace anterolisthesis. Disc bulge with disc desiccation and reactive endplate spurring. Mild facet hypertrophy. No spinal stenosis. Mild bilateral L5 foraminal narrowing. IMPRESSION: 1. Widespread osseous metastatic disease throughout the lumbar spine and visualized pelvis. No significant extra osseous or epidural involvement. No pathologic fracture or other complication. 2. 5.8 cm mass arising from the  interpolar right kidney, concerning for an additional possible primary renal cell neoplasm. Further evaluation with dedicated renal mass protocol CT and/or MRI recommended for further evaluation. 3. Mild for age degenerative spondylosis and facet hypertrophy throughout the lumbar spine without significant spinal stenosis. Associated mild bilateral L3 through L5 foraminal narrowing. Electronically Signed   By: Jeannine Boga M.D.   On: 09/13/2020 01:29   CT T-SPINE NO CHARGE  Result Date: 09/12/2020 CLINICAL DATA:  Fall W19.XXXA (ICD-10-CM) EXAM: CT THORACIC SPINE WITHOUT CONTRAST TECHNIQUE: Multidetector CT images of the thoracic were obtained using the standard protocol without intravenous contrast. COMPARISON:  None. FINDINGS: Alignment: No substantial sagittal subluxation. Vertebrae: Numerous sclerotic lesions throughout the thoracic spine, compatible with metastases. There likely lesions at every thoracic level lesions involving the vertebral bodies and posterior elements. The largest lesion is at T2 and is destructive with bony erosive change of vertebral body and posterior elements. There is extensive epidural extension tumor into the canal and left foramen with associated canal and foraminal stenosis, potentially severe. There is height loss of the T3 vertebral body, compatible with pathologic fracture. Osteopenia. Paraspinal and other soft tissues: Please see concurrent CT chest/abdomen/pelvis for intrathoracic and intra-abdominal evaluation Disc levels: Epidural tumor at C2 level, described above. Mild multilevel thoracic degenerative change with anterior osteophytes. IMPRESSION: 1. Numerous metastases throughout the thoracic spine. The largest lesion is at T2 with extensive bony erosive change of the vertebral body and posterior elements and bulky epidural extension of tumor into the canal and left foramen at this level. Resulting canal and foraminal stenosis, likely severe. An MRI with contrast  could further evaluate the extent of epidural tumor and the canal, cord and foramina if clinically indicated. 2. T2 vertebral body height loss, compatible with pathologic fracture. 3. Osteopenia. 4. Please see concurrent CT chest/abdomen/pelvis for intrathoracic and intra-abdominal evaluation Electronically Signed   By: Margaretha Sheffield M.D.   On: 09/12/2020  16:27   CT CHEST ABDOMEN PELVIS WO CONTRAST  Result Date: 09/12/2020 CLINICAL DATA:  80 year old female with history of blunt trauma after fall and weakness, thoracic back pain by report. EXAM: CT CHEST, ABDOMEN AND PELVIS WITHOUT CONTRAST TECHNIQUE: Multidetector CT imaging of the chest, abdomen and pelvis was performed following the standard protocol without IV contrast. COMPARISON:  Thoracic spine evaluation of the same date. FINDINGS: CT CHEST FINDINGS Cardiovascular: Calcified atheromatous plaque of the thoracic aorta. No aneurysmal dilation. Central pulmonary vasculature is unremarkable. Heart size is normal without pericardial effusion. Three-vessel coronary artery disease. Limited assessment of cardiovascular structures given lack of intravenous contrast. Mediastinum/Nodes: Thoracic inlet structures are unremarkable. No axillary lymphadenopathy. No mediastinal adenopathy. Fullness of the LEFT hilum with central pulmonary mass, see below. Esophagus grossly normal. Lungs/Pleura: LEFT upper lobe mass contiguous with LEFT hilum with spiculated morphologic features and distortion of the LEFT hilum, associated with some volume loss extending into the lingula. In the axial plane this Scattered pulmonary nodules throughout the chest, for instance on image measures approximately 3.2 x 2.0 cm. 24/4 there is a small 5 mm RIGHT middle lobe pulmonary nodule. Numerous other tiny nodules in the 4-5 mm range seen in the bilateral chest. No pleural effusion. Small amount of material layering in the RIGHT mainstem bronchus, airways otherwise unremarkable aside from  findings described above. Musculoskeletal: Multiple sclerotic lesions throughout the spine, see dedicated spine evaluation for further detail. Additionally, there is destruction of the T2 thoracic vertebral body with soft tissue extending into the central canal narrowing the central canal at this level. Destruction of posterior T2 vertebral body, LEFT pedicle and with other findings better displayed in reported separately on dedicated thoracic spine CT. See below for full musculoskeletal details. CT ABDOMEN PELVIS FINDINGS Hepatobiliary: Scattered small low-density lesions in the liver incompletely characterized on noncontrast imaging some of which may represent cysts. No comparison imaging is available. No pericholecystic stranding. No gross biliary duct dilation. Pancreas: Signs of pancreatic atrophy. Spleen: Spleen is normal size without visible abnormality. Adrenals/Urinary Tract: Bilateral adrenal thickening displays low-density and maintains adreniform contours. This is more likely related to adrenal hyperplasia particularly on the LEFT. Cysts in the bilateral kidneys. No hydronephrosis. No perinephric stranding. Urinary bladder with smooth contours. No adjacent stranding. RIGHT-sided renal lesion in the interpolar RIGHT kidney does not meet criteria for cyst and shows heterogeneity measuring 31 Hounsfield units, at 4.5 x 4.3 cm, incompletely characterized on noncontrast imaging. Fifty Hounsfield unit density, homogeneous lesion arising from the medial RIGHT kidney measures 2.5 x 2.0 cm arising from the upper posterior hilar lip. Stomach/Bowel: No acute gastrointestinal process. Vascular/Lymphatic: Calcified atheromatous plaque in the abdominal aorta without aneurysmal dilation. There is no gastrohepatic or hepatoduodenal ligament lymphadenopathy. No retroperitoneal or mesenteric lymphadenopathy. No pelvic sidewall lymphadenopathy. Reproductive: Post hysterectomy. Other: No ascites. No sign of free air. RIGHT  inguinal hernia contains fat. Musculoskeletal: Numerous sclerotic foci, see thoracic spine imaging for destructive lesion at T2 which shows canal compromise. Sclerotic lesions also present in the lumbar spine mixed lytic and sclerotic focus at the L4 vertebral level. Sclerotic lesions also in the bony pelvis with destructive lesion in the LEFT greater trochanter showing early bony destruction and subtle sclerosis. Sclerotic focus on image 83 of series 2 measuring approximately cm in the LEFT iliac. Similar findings seen inferiorly on image 92 of series 2 with more subtle scattered areas of sclerosis elsewhere in the bony pelvis. Subtle area in the proximal femur seen on image 107 of series  2 may measure as much as 3.9 x 2.5 cm no sign of acute fracture of the bony pelvis. IMPRESSION: 1. 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 2. 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 MRI of the thoracic spine may be helpful as there is severe central canal narrowing associated with this finding. 3. Scattered pulmonary nodules suspicious for small pulmonary metastases in the current context. 4. Findings suspicious for solid RIGHT renal lesions also raising the question of solid renal neoplasms. Suggest follow-up ultrasound evaluation as an initial means of evaluating this finding if the patient cannot receive intravenous contrast. 5. RIGHT inguinal hernia. 6. Aortic atherosclerosis. Aortic Atherosclerosis (ICD10-I70.0). Electronically Signed   By: Zetta Bills M.D.   On: 09/12/2020 16:45      Subjective: No new complaints, no headache, no focal weakness, no confusion, no fever, no cough, no dyspnea.  Discharge Exam: Vitals:   09/22/20 0525 09/22/20 0835  BP: 133/65 121/79  Pulse: 86 69  Resp: 18 18  Temp: 98.9 F (37.2 C) 98 F (36.7 C)  SpO2: 100% 100%   Vitals:   09/21/20  1801 09/21/20 2057 09/22/20 0525 09/22/20 0835  BP: 127/66 (!) 122/58 133/65 121/79  Pulse: 72  86 69  Resp: 17 20 18 18   Temp: 98.5 F (36.9 C) 98.8 F (37.1 C) 98.9 F (37.2 C) 98 F (36.7 C)  TempSrc: Oral Oral Oral Oral  SpO2: 100% 98% 100% 100%  Weight:      Height:        General: Pt is alert, awake, not in acute distress, lying in bed, appears chronically tired Cardiovascular: RRR, nl S1-S2, no murmurs appreciated.   No LE edema.   Respiratory: Normal respiratory rate and rhythm.  CTAB without rales or wheezes. Abdominal: Abdomen soft and non-tender.  No distension or HSM.   Neuro/Psych: Strength symmetric in upper extremities, generally weak in the lower extremities.  Memory and orientation normal, attention somewhat distracted          The results of significant diagnostics from this hospitalization (including imaging, microbiology, ancillary and laboratory) are listed below for reference.     Microbiology: Recent Results (from the past 240 hour(s))  Resp Panel by RT-PCR (Flu A&B, Covid) Nasopharyngeal Swab     Status: Abnormal   Collection Time: 09/12/20  3:12 PM   Specimen: Nasopharyngeal Swab; Nasopharyngeal(NP) swabs in vial transport medium  Result Value Ref Range Status   SARS Coronavirus 2 by RT PCR POSITIVE (A) NEGATIVE Final    Comment: RESULT CALLED TO, READ BACK BY AND VERIFIED WITH: KATIE RAND AT 2774 ON 09/12/20 BY SS (NOTE) SARS-CoV-2 target nucleic acids are DETECTED.  The SARS-CoV-2 RNA is generally detectable in upper respiratory specimens during the acute phase of infection. Positive results are indicative of the presence of the identified virus, but do not rule out bacterial infection or co-infection with other pathogens not detected by the test. Clinical correlation with patient history and other diagnostic information is necessary to determine patient infection status. The expected result is Negative.  Fact Sheet for  Patients: EntrepreneurPulse.com.au  Fact Sheet for Healthcare Providers: IncredibleEmployment.be  This test is not yet approved or cleared by the [place] FDA and  has been authorized for detection and/or diagnosis of SARS-CoV-2 by FDA under an Emergency Use Authorization (EUA).  This EUA will remain in effect (meaning this test can  be  used) for the duration of  the COVID-19 declaration under Section 564(b)(1) of the Act, 21 U.S.C. section 360bbb-3(b)(1), unless the authorization is terminated or revoked sooner.     Influenza A by PCR NEGATIVE NEGATIVE Final   Influenza B by PCR NEGATIVE NEGATIVE Final    Comment: (NOTE) The Xpert Xpress SARS-CoV-2/FLU/RSV plus assay is intended as an aid in the diagnosis of influenza from Nasopharyngeal swab specimens and should not be used as a sole basis for treatment. Nasal washings and aspirates are unacceptable for Xpert Xpress SARS-CoV-2/FLU/RSV testing.  Fact Sheet for Patients: EntrepreneurPulse.com.au  Fact Sheet for Healthcare Providers: IncredibleEmployment.be  This test is not yet approved or cleared by the [place] FDA and has been authorized for detection and/or diagnosis of SARS-CoV-2 by FDA under an Emergency Use Authorization (EUA). This EUA will remain in effect (meaning this test can be used) for the duration of the COVID-19 declaration under Section 564(b)(1) of the Act, 21 U.S.C. section 360bbb-3(b)(1), unless the authorization is terminated or revoked.  Performed at [facility], [address] [place],  [address]      Labs: BNP (last 3 results) Recent Labs    09/12/20 1350  BNP 829.9*   Basic Metabolic Panel: Recent Labs  Lab 09/16/20 0445 09/17/20 0538 09/19/20 0634  NA 139 138 136  K 3.9 4.2 4.2  CL 109 107 106  CO2 24 24 22   GLUCOSE 106* 105* 87  BUN 28* 28* 23  CREATININE 0.98 0.96 0.84  CALCIUM  8.3* 8.5* 8.2*  MG 2.0  --  1.9   Liver Function Tests: No results for input(s): AST, ALT, ALKPHOS, BILITOT, PROT, ALBUMIN in the last 168 hours. No results for input(s): LIPASE, AMYLASE in the last 168 hours. No results for input(s): AMMONIA in the last 168 hours. CBC: Recent Labs  Lab 09/16/20 0445 09/17/20 0538 09/20/20 0544  WBC 11.0* 11.9* 12.7*  HGB 11.4* 11.4* 10.8*  HCT 33.8* 34.7* 32.3*  MCV 84.7 83.8 82.2  PLT 147* 148* 168   Cardiac Enzymes: No results for input(s): CKTOTAL, CKMB, CKMBINDEX, TROPONINI in the last 168 hours. BNP: Invalid input(s): POCBNP CBG: Recent Labs  Lab 09/18/20 1300 09/18/20 1735  GLUCAP 218* 136*   D-Dimer No results for input(s): DDIMER in the last 72 hours. Hgb A1c No results for input(s): HGBA1C in the last 72 hours. Lipid Profile No results for input(s): CHOL, HDL, LDLCALC, TRIG, CHOLHDL, LDLDIRECT in the last 72 hours. Thyroid function studies No results for input(s): TSH, T4TOTAL, T3FREE, THYROIDAB in the last 72 hours.  Invalid input(s): FREET3 Anemia work up No results for input(s): VITAMINB12, FOLATE, FERRITIN, TIBC, IRON, RETICCTPCT in the last 72 hours. Urinalysis    Component Value Date/Time   COLORURINE YELLOW (A) 05/20/2017 0923   APPEARANCEUR HAZY (A) 05/20/2017 0923   LABSPEC 1.014 05/20/2017 0923   PHURINE 7.0 05/20/2017 0923   GLUCOSEU NEGATIVE 05/20/2017 0923   HGBUR NEGATIVE 05/20/2017 0923   BILIRUBINUR NEGATIVE 05/20/2017 0923   KETONESUR NEGATIVE 05/20/2017 0923   PROTEINUR NEGATIVE 05/20/2017 0923   NITRITE NEGATIVE 05/20/2017 0923   LEUKOCYTESUR SMALL (A) 05/20/2017 0923   Sepsis Labs Invalid input(s): PROCALCITONIN,  WBC,  LACTICIDVEN Microbiology Recent Results (from the past 240 hour(s))  Resp Panel by RT-PCR (Flu A&B, Covid) Nasopharyngeal Swab     Status: Abnormal   Collection Time: 09/12/20  3:12 PM   Specimen: Nasopharyngeal Swab; Nasopharyngeal(NP) swabs in vial transport medium  Result  Value Ref Range Status  SARS Coronavirus 2 by RT PCR POSITIVE (A) NEGATIVE Final    Comment: RESULT CALLED TO, READ BACK BY AND VERIFIED WITH: KATIE RAND AT 5830 ON 09/12/20 BY SS (NOTE) SARS-CoV-2 target nucleic acids are DETECTED.  The SARS-CoV-2 RNA is generally detectable in upper respiratory specimens during the acute phase of infection. Positive results are indicative of the presence of the identified virus, but do not rule out bacterial infection or co-infection with other pathogens not detected by the test. Clinical correlation with patient history and other diagnostic information is necessary to determine patient infection status. The expected result is Negative.  Fact Sheet for Patients: EntrepreneurPulse.com.au  Fact Sheet for Healthcare Providers: IncredibleEmployment.be  This test is not yet approved or cleared by the [place] FDA and  has been authorized for detection and/or diagnosis of SARS-CoV-2 by FDA under an Emergency Use Authorization (EUA).  This EUA will remain in effect (meaning this test can  be used) for the duration of  the COVID-19 declaration under Section 564(b)(1) of the Act, 21 U.S.C. section 360bbb-3(b)(1), unless the authorization is terminated or revoked sooner.     Influenza A by PCR NEGATIVE NEGATIVE Final   Influenza B by PCR NEGATIVE NEGATIVE Final    Comment: (NOTE) The Xpert Xpress SARS-CoV-2/FLU/RSV plus assay is intended as an aid in the diagnosis of influenza from Nasopharyngeal swab specimens and should not be used as a sole basis for treatment. Nasal washings and aspirates are unacceptable for Xpert Xpress SARS-CoV-2/FLU/RSV testing.  Fact Sheet for Patients: EntrepreneurPulse.com.au  Fact Sheet for Healthcare Providers: IncredibleEmployment.be  This test is not yet approved or cleared by the [place] FDA and has been authorized for detection  and/or diagnosis of SARS-CoV-2 by FDA under an Emergency Use Authorization (EUA). This EUA will remain in effect (meaning this test can be used) for the duration of the COVID-19 declaration under Section 564(b)(1) of the Act, 21 U.S.C. section 360bbb-3(b)(1), unless the authorization is terminated or revoked.  Performed at [facility], [address] [place], [place] [address]      Time coordinating discharge: 25 minutes The [place] controlled substances registry was reviewed for this patient     30 Day Unplanned Readmission Risk Score    Flowsheet Row ED to Hosp-Admission (Current) from 09/12/2020 in [place]  30 Day Unplanned Readmission Risk Score (%) 13.33 Filed at 09/22/2020 1200       This score is the patient's risk of an unplanned readmission within 30 days of being discharged (0 -100%). The score is based on dignosis, age, lab data, medications, orders, and past utilization.   Low:  0-14.9   Medium: 15-21.9   High: 22-29.9   Extreme: 30 and above            SIGNED:   Edwin Dada, MD  [facility] 09/22/2020, 1:52 PM

## 2020-09-22 NOTE — TOC Progression Note (Addendum)
Transition of Care [facility]) - Progression Note    Patient Details  Name: Ariana Lee MRN: [id number] Date of Birth: 1940-11-08  Transition of Care [facility]) CM/SW [place], LCSW Phone Number: 09/22/2020, 9:41 AM  Clinical Narrative:   Reached out to Neoma Laming with [facility] to confirm plan for DC to [facility]. Informed her insurance Josem Kaufmann was obtained and will send DC Summary when completed today. Waiting for confirmation from [facility].   10:15- Neoma Laming confirmed that [facility] can take patient tomorrow as long as she gets DC Summary today, confirms CSW can send in the [place].   2:00- DC Summary sent in [place]. Notified Merchant navy officer with [facility].  Expected Discharge Plan: [place] Barriers to Discharge: Continued Medical Work up  Expected Discharge Plan and Services Expected Discharge Plan: [place]   Discharge Planning Services: CM Consult   Living arrangements for the past 2 months: Single Family Home                                       Social Determinants of Health (SDOH) Interventions    Readmission Risk Interventions No flowsheet data found.

## 2020-09-22 NOTE — Care Management Important Message (Signed)
Important Message  Patient Details  Name: Ariana Lee MRN: [id number] Date of Birth: 1940/04/16   Medicare Important Message Given:  Yes - Important Message mailed due to current [facility]     Dannette Barbara 09/22/2020, 5:06 PM

## 2020-09-23 NOTE — Progress Notes (Signed)
[facility] PROGRESS NOTE    [name]  MLY:[id number] DOB: 1940/06/17 DOA: 09/12/2020 PCP: Pcp, No      Brief Narrative:  Ariana Lee is a 80 y.o. F with metastatic lung CA, HTN, CAD, and PVD who presented with several days of generalized weakness, poor appetite, falls.  In the ER found to be COVID-positive.  Also found to have widespread metastatic disease throughout the thoracic and lumbar spine with some mild impingement.  Neurosurgery was consulted recommended palliative radiation.    Patient stable for discharge yesterday, held until today for covid isolation    Assessment & Plan:  Asymptomatic covid Resolved.  Completed ioslation, no further symptoms.  Hypertension Coronary disease Blood pressure normal - Continue carvedilol, felodipine, lisinopril, atorvastatin   Renal cell carcinoma  Obesity BMI 33          Disposition: Status is: Inpatient  Remains inpatient appropriate because:Unsafe d/c plan  Dispo: The patient is from: Home              Anticipated d/c is to: SNF              Patient currently is medically stable to d/c.   Difficult to place patient No       Level of care: Med-Surg       MDM: The below labs and imaging reports were reviewed and summarized above.  Medication management as above.    DVT prophylaxis: SCDs Start: 09/12/20 1940  Code Status: FULL Family Communication: Son by phone         Subjective: No new complaints, no headache, chest pain, neck pain, confusion, leg pain, leg weakness.  Objective: Vitals:   09/22/20 1612 09/22/20 2038 09/23/20 0448 09/23/20 0900  BP: 139/72 (!) 113/55 117/69 (!) 125/56  Pulse: 87 75 70 87  Resp: 17 16 18 18   Temp: 98 F (36.7 C) 99 F (37.2 C) 98.7 F (37.1 C) 98.7 F (37.1 C)  TempSrc: Oral Oral Oral Oral  SpO2: 99% 100% 100% 99%  Weight:      Height:        Intake/Output Summary (Last 24 hours) at 09/23/2020 1015 Last data filed at  09/23/2020 0450 Gross per 24 hour  Intake --  Output 650 ml  Net -650 ml   Filed Weights   09/12/20 1055  Weight: 90.7 kg    Examination: General appearance: Elderly adult female, lying in bed, no acute distress     HEENT:    Skin:  Cardiac: Regular rate and rhythm, no murmurs, no lower extremity edema  respiratory: Normal respiratory rate and rhythm, lungs clear without rales or wheezes Abdomen:   MSK:  Neuro:    Psych: Attention normal, affect appropriate, judgment and insight appear normal     Data Reviewed: I have personally reviewed following labs and imaging studies:  CBC: Recent Labs  Lab 09/17/20 0538 09/20/20 0544  WBC 11.9* 12.7*  HGB 11.4* 10.8*  HCT 34.7* 32.3*  MCV 83.8 82.2  PLT 148* 812   Basic Metabolic Panel: Recent Labs  Lab 09/17/20 0538 09/19/20 0634  NA 138 136  K 4.2 4.2  CL 107 106  CO2 24 22  GLUCOSE 105* 87  BUN 28* 23  CREATININE 0.96 0.84  CALCIUM 8.5* 8.2*  MG  --  1.9   GFR: Estimated Creatinine Clearance: 58.5 mL/min (by C-G formula based on SCr of 0.84 mg/dL). Liver Function Tests: No results for input(s): AST, ALT, ALKPHOS, BILITOT, PROT, ALBUMIN  in the last 168 hours.  No results for input(s): LIPASE, AMYLASE in the last 168 hours. No results for input(s): AMMONIA in the last 168 hours. Coagulation Profile: No results for input(s): INR, PROTIME in the last 168 hours. Cardiac Enzymes: No results for input(s): CKTOTAL, CKMB, CKMBINDEX, TROPONINI in the last 168 hours. BNP (last 3 results) No results for input(s): PROBNP in the last 8760 hours. HbA1C: No results for input(s): HGBA1C in the last 72 hours. CBG: Recent Labs  Lab 09/18/20 1300 09/18/20 1735  GLUCAP 218* 136*   Lipid Profile: No results for input(s): CHOL, HDL, LDLCALC, TRIG, CHOLHDL, LDLDIRECT in the last 72 hours. Thyroid Function Tests: No results for input(s): TSH, T4TOTAL, FREET4, T3FREE, THYROIDAB in the last 72 hours. Anemia Panel: No results  for input(s): VITAMINB12, FOLATE, FERRITIN, TIBC, IRON, RETICCTPCT in the last 72 hours. Urine analysis:    Component Value Date/Time   COLORURINE YELLOW (A) 05/20/2017 0923   APPEARANCEUR HAZY (A) 05/20/2017 0923   LABSPEC 1.014 05/20/2017 0923   PHURINE 7.0 05/20/2017 0923   GLUCOSEU NEGATIVE 05/20/2017 0923   HGBUR NEGATIVE 05/20/2017 0923   BILIRUBINUR NEGATIVE 05/20/2017 0923   KETONESUR NEGATIVE 05/20/2017 0923   PROTEINUR NEGATIVE 05/20/2017 0923   NITRITE NEGATIVE 05/20/2017 0923   LEUKOCYTESUR SMALL (A) 05/20/2017 0923   Sepsis Labs: @LABRCNTIP (procalcitonin:4,lacticacidven:4)  ) No results found for this or any previous visit (from the past 240 hour(s)).        Radiology Studies: No results found.      Scheduled Meds:  atorvastatin  80 mg Oral QHS   carvedilol  12.5 mg Oral BID WC   dexamethasone  2 mg Oral Daily   felodipine  10 mg Oral Daily   lidocaine  1 patch Transdermal Q24H   lisinopril  20 mg Oral Daily   pantoprazole  40 mg Oral BID   polyethylene glycol  17 g Oral Daily   senna-docusate  1 tablet Oral BID   sodium chloride flush  3 mL Intravenous Q12H   Continuous Infusions:   LOS: 10 days    Time spent: 15 minutes    Edwin Dada, MD [facility] 09/23/2020, 10:15 AM     Please page though AMION or Epic secure chat:  For [employer], Adult nurse

## 2020-09-23 NOTE — TOC Progression Note (Signed)
Transition of Care [facility]) - Progression Note    Patient Details  Name: Ariana Lee MRN: [id number] Date of Birth: 1940/12/24  Transition of Care [facility]) CM/SW [place], LCSW Phone Number: 09/23/2020, 9:15 AM  Clinical Narrative:   Patient is supposed to DC to [facility] today. CSW left VM for Neoma Laming in Admissions requesting a return call as soon as possible with room # and # for report.    Expected Discharge Plan: [place] Barriers to Discharge: Continued Medical Work up  Expected Discharge Plan and Services Expected Discharge Plan: [place]   Discharge Planning Services: CM Consult   Living arrangements for the past 2 months: Single Family Home                                       Social Determinants of Health (SDOH) Interventions    Readmission Risk Interventions No flowsheet data found.

## 2020-09-23 NOTE — TOC Transition Note (Signed)
Transition of Care [facility]) - CM/SW Discharge Note   Patient Details  Name: ANJELINA DUNG MRN: [id number] Date of Birth: 05-01-1940  [facility]) CM/SW Contact:  Magnus Ivan, LCSW Phone Number: 09/23/2020, 12:42 PM   Clinical Narrative:   Patient to discharge to [facility] today, Room 224B. Confirmed with Neoma Laming at [facility]. CSW updated MD, RN, patient/family. Asked RN to call report and MD to submit DC Summary. Medical Necessity Form and Face Sheet placed in Discharge Packet by patient chart. EMS transport arranged, as of 12:40 patient is 4th on the list. RN notified. No other needs identified prior to discharge.     Final next level of care: Skilled Nursing Facility Barriers to Discharge: Barriers Resolved   Patient Goals and CMS Choice Patient states their goals for this hospitalization and ongoing recovery are:: SNF CMS Medicare.gov Compare Post Acute Care list provided to:: Patient Choice offered to / list presented to : Patient  Discharge Placement              Patient chooses bed at: [facility] Patient to be transferred to facility by: [facility] Name of family member notified: patient notified, says her son is aware Patient and family notified of of transfer: 09/23/20  Discharge Plan and Services   Discharge Planning Services: CM Consult                                 Social Determinants of Health (SDOH) Interventions     Readmission Risk Interventions No flowsheet data found.

## 2020-09-23 NOTE — Plan of Care (Signed)
Patient discharged to [facility], son contacted, discharge instructions reviewed

## 2020-09-23 NOTE — OR Nursing (Signed)
VSS, report called to [facility] , transported by EMS

## 2020-11-21 DEATH — deceased
